# Patient Record
Sex: Female | Born: 1955 | Race: White | Hispanic: No | Marital: Married | Smoking: Never smoker
Health system: Southern US, Community
[De-identification: ages and names within clinical notes are randomized; demographics above are authoritative.]

## PROBLEM LIST (undated history)

## (undated) DIAGNOSIS — I10 Essential (primary) hypertension: Secondary | ICD-10-CM

## (undated) DIAGNOSIS — M1990 Unspecified osteoarthritis, unspecified site: Secondary | ICD-10-CM

## (undated) DIAGNOSIS — R112 Nausea with vomiting, unspecified: Secondary | ICD-10-CM

## (undated) DIAGNOSIS — K219 Gastro-esophageal reflux disease without esophagitis: Secondary | ICD-10-CM

## (undated) DIAGNOSIS — M810 Age-related osteoporosis without current pathological fracture: Secondary | ICD-10-CM

## (undated) DIAGNOSIS — Z9889 Other specified postprocedural states: Secondary | ICD-10-CM

## (undated) HISTORY — PX: WRIST SURGERY: SHX841

---

## 2005-07-24 ENCOUNTER — Ambulatory Visit (HOSPITAL_COMMUNITY): Admission: RE | Admit: 2005-07-24 | Discharge: 2005-07-24 | Payer: Self-pay | Admitting: Orthopedic Surgery

## 2005-10-01 ENCOUNTER — Ambulatory Visit (HOSPITAL_BASED_OUTPATIENT_CLINIC_OR_DEPARTMENT_OTHER): Admission: RE | Admit: 2005-10-01 | Discharge: 2005-10-02 | Payer: Self-pay | Admitting: *Deleted

## 2009-01-18 ENCOUNTER — Other Ambulatory Visit: Admission: RE | Admit: 2009-01-18 | Discharge: 2009-01-18 | Payer: Self-pay | Admitting: Family Medicine

## 2010-12-13 NOTE — Op Note (Signed)
NAMETELISSA, SERFASS                 ACCOUNT NO.:  [id number]   MEDICAL RECORD NO.:  [id number]          PATIENT TYPE:  AMB   LOCATION:  DSC                          FACILITY:  [facility]   PHYSICIAN:  Tennis Must Meyerdierks, M.D.DATE OF BIRTH:  02/28/1956   DATE OF PROCEDURE:  10/01/2005  DATE OF DISCHARGE:                                 OPERATIVE REPORT   PREOP DIAGNOSIS:  Scapholunate advanced collapse, right wrist.   POSTOP DIAGNOSIS:  Scapholunate advanced collapse, right wrist.   PROCEDURE:  Proximal row carpectomy, right wrist.   SURGEON:  Dr. Metro Kung.   ANESTHESIA:  General.   OPERATIVE FINDINGS:  The patient had complete loss of articular cartilage on  the proximal scaphoid and the radial half of the distal radius. The lunate  facet and the proximal capitate had no signs of arthritic change.   PROCEDURE:  Under general anesthesia with a tourniquet on the right arm, the  right hand was prepped and draped in usual fashion and after exsanguinating  the limb, the tourniquet was inflated to 250 mmHg. A longitudinal incision  was made in the dorsum of the wrist and carried down to the third dorsal  compartment. The EPL tendon was released from its sheath. The tendons of the  fourth dorsal compartment were then elevated subperiosteally off of the  distal radius and retracted ulnarly. The capsule of the joint was opened  longitudinally and a Therapist, nutritional was inserted to examine the articular  surfaces. The radioscaphoid joint was completely denuded of cartilage. The  radiolunate joint had good cartilage on both sides. The capital lunate joint  also had good cartilage on both sides. It was determined at this point the  proximal row carpectomy would be appropriate. The lunate was removed in a  piecemeal fashion with a rongeur.  The volar portion was excised after  dividing the volar half of the lunate into two pieces with osteotome. Care  was taken to protect the ligaments  volarly. The triquetrum was then removed  after dividing it in two with a osteotome. The TFC remained intact, although  it did have a small hole and it. After completely excising the triquetrum,  the fusiform was seen and left untouched. The scaphoid was then excised  after dividing it in two with an osteotome. There was a strong radioscaphoid  capitate ligament that was protected. The distal 10% of the scaphoid was  left alone as it did not involve any impaction on the radius. It was mainly  the volar portion that was capsular that was left in place. After excising  the scaphoid, the capitate shifted proximally into the facet on the distal  radius of the lunate. X-rays showed good position of the capitate and also  good excision of the proximal row. The wound was then irrigated copiously  with saline. The capsular tissue was repaired with a 4-0 Vicryl suture. The  extensor sheath of the third dorsal compartment was likewise repaired with a  4-0 Vicryl to the fourth dorsal compartment sheath. Vessel loop drain was  left in for drainage.  Subcutaneous tissue  was closed with  4-0 Vicryl. The skin was closed with a 3-0 subcuticular Prolene. Steri-  Strips were applied. Half percent Marcaine was inserted in the wound edges  for pain control. The patient had the tourniquet released with good  circulation of the hand. She was placed in a volar wrist splint. She went to  the recovery room awake in stable condition.      Lowell Bouton, M.D.  Electronically Signed     EMM/MEDQ  D:  10/01/2005  T:  10/02/2005  Job:  [id number]   cc:   Areatha Keas, M.D.  Fax: [phone]

## 2011-01-16 ENCOUNTER — Other Ambulatory Visit (HOSPITAL_COMMUNITY)
Admission: RE | Admit: 2011-01-16 | Discharge: 2011-01-16 | Disposition: A | Discharge status: Home / Self Care | Payer: BC Managed Care – PPO | Source: Ambulatory Visit | Attending: Family Medicine | Admitting: Family Medicine

## 2011-01-16 ENCOUNTER — Other Ambulatory Visit: Payer: Self-pay | Admitting: Family Medicine

## 2011-01-16 DIAGNOSIS — Z1159 Encounter for screening for other viral diseases: Secondary | ICD-10-CM | POA: Insufficient documentation

## 2011-01-16 DIAGNOSIS — Z124 Encounter for screening for malignant neoplasm of cervix: Secondary | ICD-10-CM | POA: Insufficient documentation

## 2012-12-24 ENCOUNTER — Other Ambulatory Visit (HOSPITAL_COMMUNITY)
Admission: RE | Admit: 2012-12-24 | Discharge: 2012-12-24 | Disposition: A | Discharge status: Home / Self Care | Payer: BC Managed Care – PPO | Source: Ambulatory Visit | Attending: Family Medicine | Admitting: Family Medicine

## 2012-12-24 ENCOUNTER — Other Ambulatory Visit: Payer: Self-pay | Admitting: Family Medicine

## 2012-12-24 DIAGNOSIS — Z1151 Encounter for screening for human papillomavirus (HPV): Secondary | ICD-10-CM | POA: Insufficient documentation

## 2012-12-24 DIAGNOSIS — Z01419 Encounter for gynecological examination (general) (routine) without abnormal findings: Secondary | ICD-10-CM | POA: Insufficient documentation

## 2015-02-12 ENCOUNTER — Other Ambulatory Visit (HOSPITAL_COMMUNITY)
Admission: RE | Admit: 2015-02-12 | Discharge: 2015-02-12 | Disposition: A | Discharge status: Home / Self Care | Payer: BLUE CROSS/BLUE SHIELD | Source: Ambulatory Visit | Attending: Physician Assistant | Admitting: Physician Assistant

## 2015-02-12 ENCOUNTER — Other Ambulatory Visit: Payer: Self-pay | Admitting: Physician Assistant

## 2015-02-12 DIAGNOSIS — Z124 Encounter for screening for malignant neoplasm of cervix: Secondary | ICD-10-CM | POA: Insufficient documentation

## 2015-02-12 DIAGNOSIS — Z1151 Encounter for screening for human papillomavirus (HPV): Secondary | ICD-10-CM | POA: Diagnosis present

## 2015-02-13 LAB — CYTOLOGY - PAP

## 2015-12-21 DIAGNOSIS — K2970 Gastritis, unspecified, without bleeding: Secondary | ICD-10-CM | POA: Diagnosis not present

## 2015-12-21 DIAGNOSIS — M25559 Pain in unspecified hip: Secondary | ICD-10-CM | POA: Diagnosis not present

## 2015-12-21 DIAGNOSIS — M459 Ankylosing spondylitis of unspecified sites in spine: Secondary | ICD-10-CM | POA: Diagnosis not present

## 2015-12-21 DIAGNOSIS — M7060 Trochanteric bursitis, unspecified hip: Secondary | ICD-10-CM | POA: Diagnosis not present

## 2016-01-09 DIAGNOSIS — J020 Streptococcal pharyngitis: Secondary | ICD-10-CM | POA: Diagnosis not present

## 2016-02-04 DIAGNOSIS — M419 Scoliosis, unspecified: Secondary | ICD-10-CM | POA: Diagnosis not present

## 2016-02-04 DIAGNOSIS — M7061 Trochanteric bursitis, right hip: Secondary | ICD-10-CM | POA: Diagnosis not present

## 2016-03-04 DIAGNOSIS — M542 Cervicalgia: Secondary | ICD-10-CM | POA: Diagnosis not present

## 2016-03-04 DIAGNOSIS — M419 Scoliosis, unspecified: Secondary | ICD-10-CM | POA: Diagnosis not present

## 2016-03-04 DIAGNOSIS — M545 Low back pain: Secondary | ICD-10-CM | POA: Diagnosis not present

## 2016-03-11 ENCOUNTER — Ambulatory Visit: Payer: BLUE CROSS/BLUE SHIELD | Admitting: Physical Therapy

## 2016-04-29 ENCOUNTER — Ambulatory Visit (INDEPENDENT_AMBULATORY_CARE_PROVIDER_SITE_OTHER): Payer: BLUE CROSS/BLUE SHIELD | Admitting: Orthopaedic Surgery

## 2016-04-29 DIAGNOSIS — M542 Cervicalgia: Secondary | ICD-10-CM

## 2016-04-29 DIAGNOSIS — M545 Low back pain: Secondary | ICD-10-CM

## 2016-04-29 DIAGNOSIS — M419 Scoliosis, unspecified: Secondary | ICD-10-CM

## 2016-05-21 DIAGNOSIS — M5136 Other intervertebral disc degeneration, lumbar region: Secondary | ICD-10-CM | POA: Diagnosis not present

## 2016-10-01 ENCOUNTER — Encounter (INDEPENDENT_AMBULATORY_CARE_PROVIDER_SITE_OTHER): Payer: Self-pay | Admitting: Orthopedic Surgery

## 2016-10-01 ENCOUNTER — Ambulatory Visit (INDEPENDENT_AMBULATORY_CARE_PROVIDER_SITE_OTHER): Payer: BLUE CROSS/BLUE SHIELD | Admitting: Orthopedic Surgery

## 2016-10-01 DIAGNOSIS — M7061 Trochanteric bursitis, right hip: Secondary | ICD-10-CM | POA: Diagnosis not present

## 2016-10-01 MED ORDER — BUPIVACAINE HCL 0.25 % IJ SOLN
4.0000 mL | INTRAMUSCULAR | Status: AC | PRN
  Administered 2016-10-01: 4 mL via INTRA_ARTICULAR

## 2016-10-01 MED ORDER — TRIAMCINOLONE ACETONIDE 40 MG/ML IJ SUSP
40.0000 mg | INTRAMUSCULAR | Status: AC | PRN
  Administered 2016-10-01: 40 mg via INTRA_ARTICULAR

## 2016-10-01 MED ORDER — LIDOCAINE HCL 1 % IJ SOLN
5.0000 mL | INTRAMUSCULAR | Status: AC | PRN
  Administered 2016-10-01: 5 mL

## 2016-10-01 NOTE — Progress Notes (Signed)
Office Visit Note   Patient: Susan Woodward           Date of Birth: 02-18-1956           MRN: [id number] Visit Date: 10/01/2016 Requested by: Marcelo Baldy, PA-C [address] [place], [place] [address] PCP: Marcelo Baldy, PA-C  Subjective: Chief Complaint  Patient presents with  . Right Hip - Pain    HPI: Susan Woodward is a 61 year old female with right hip pain.  She states that she has trochanteric pain.  Onset Friday, 09/26/2016.  Yesterday and today it hurts so badly she couldn't really been over. She actually has a positional component of the pain which is related to increasing and decreasing lordosis of her spine.  She took some outdated hydrocodone.  She is wondering about an injection today.  Previous trochanteric injection helped.  She took prednisone 3 months ago.  She has no history of back surgery.  She states she has been limping.  She cannot walk a mile daily she used to.  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  Denies any numbness and tingling radiating down the leg.  Physical therapy costs her out of pocket for her and therefore cost prohibitive.  She takes Lopid Naprosyn and Tylenol for her symptoms              ROS: All systems reviewed are negative as they relate to the chief complaint within the history of present illness.  Patient denies  fevers or chills.   Assessment & Plan: Visit Diagnoses:  1. Trochanteric bursitis, right hip     Plan: impression is rig.  All radiographs reviewed show what could be early hip arthritis as well.  She is having some groin pain which radiates to the back.  I think a trochanteric injection would be diagnostic and therapeutic.  If not helpful than I would favor phone call with scheduling with Dr. Alvester Morin for hip joint injection.  I'll see her back as needed ultrasound guided trochanteric bursa per injection performed today  Follow-Up Instructions: Return if symptoms worsen or fail to improve.   Orders:  No orders of  the defined types were placed in this encounter.  No orders of the defined types were placed in this encounter.     Procedures: Large Joint Inj Date/Time: 10/01/2016 8:23 PM Performed by: Cammy Copa Authorized by: Cammy Copa   Consent Given by:  Patient Site marked: the procedure site was marked   Timeout: prior to procedure the correct patient, procedure, and site was verified   Indications:  Pain and diagnostic evaluation Location:  Hip Site:  R greater trochanter Prep: patient was prepped and draped in usual sterile fashion   Needle Size:  18 G Needle Length:  3.5 inches Approach:  Lateral Ultrasound Guidance: Yes   Fluoroscopic Guidance: No   Arthrogram: No   Medications:  5 mL lidocaine 1 %; 40 mg triamcinolone acetonide 40 MG/ML; 4 mL bupivacaine 0.25 % Aspiration Attempted: No   Patient tolerance:  Patient tolerated the procedure well with no immediate complications      Clinical Data: No additional findings.  Objective: Vital Signs: There were no vitals taken for this visit.  Physical Exam:   Constitutional: Patient appears well-developed HEENT:  Head: Normocephalic Eyes:EOM are normal Neck: Normal range of motion Cardiovascular: Normal rate Pulmonary/chest: Effort normal Neurologic: Patient is alert Skin: Skin is warm Psychiatric: Patient has normal mood and affect  Ortho Exam: orthopedic exam demonstrates non-tension  STABLE COLLATERAL CRUCIAL LIGAMENT IN THE RIGHT KNEE TROCHANTERIC TENDERNESS IS PRESENT ON THE RIGHT NOT ON THE LEFT>  No real definitive groin pain with internal/external rotation of the leg.  No other masses lymph adenopathy or skin changes noted in the hip region.  No nerve root tension signs today.  No muscle atrophy in the leg.  Specialty Comments:  No specialty comments available.  Imaging: No results found.   PMFS History: Patient Active Problem List   Diagnosis Date Noted  . Trochanteric bursitis,  right hip 10/01/2016   No past medical history on file.  No family history on file.  No past surgical history on file. Social History   Occupational History  . Not on file.   Social History Main Topics  . Smoking status: Never Smoker  . Smokeless tobacco: Never Used  . Alcohol use Not on file  . Drug use: Unknown  . Sexual activity: Not on file

## 2016-10-08 ENCOUNTER — Other Ambulatory Visit (INDEPENDENT_AMBULATORY_CARE_PROVIDER_SITE_OTHER): Payer: Self-pay | Admitting: Orthopaedic Surgery

## 2016-10-08 NOTE — Telephone Encounter (Signed)
Ok for refill? 

## 2016-10-10 DIAGNOSIS — Z0000 Encounter for general adult medical examination without abnormal findings: Secondary | ICD-10-CM | POA: Diagnosis not present

## 2016-10-10 DIAGNOSIS — M8588 Other specified disorders of bone density and structure, other site: Secondary | ICD-10-CM | POA: Diagnosis not present

## 2016-10-20 DIAGNOSIS — Z803 Family history of malignant neoplasm of breast: Secondary | ICD-10-CM | POA: Diagnosis not present

## 2016-10-20 DIAGNOSIS — M8589 Other specified disorders of bone density and structure, multiple sites: Secondary | ICD-10-CM | POA: Diagnosis not present

## 2016-10-20 DIAGNOSIS — M810 Age-related osteoporosis without current pathological fracture: Secondary | ICD-10-CM | POA: Diagnosis not present

## 2016-10-20 DIAGNOSIS — Z1231 Encounter for screening mammogram for malignant neoplasm of breast: Secondary | ICD-10-CM | POA: Diagnosis not present

## 2017-01-12 ENCOUNTER — Other Ambulatory Visit (INDEPENDENT_AMBULATORY_CARE_PROVIDER_SITE_OTHER): Payer: Self-pay | Admitting: Orthopaedic Surgery

## 2017-01-12 NOTE — Telephone Encounter (Signed)
Ok for refill? 

## 2017-05-13 ENCOUNTER — Encounter (INDEPENDENT_AMBULATORY_CARE_PROVIDER_SITE_OTHER): Payer: Self-pay | Admitting: Orthopaedic Surgery

## 2017-05-13 ENCOUNTER — Ambulatory Visit (INDEPENDENT_AMBULATORY_CARE_PROVIDER_SITE_OTHER): Payer: BLUE CROSS/BLUE SHIELD | Admitting: Orthopaedic Surgery

## 2017-05-13 ENCOUNTER — Ambulatory Visit (INDEPENDENT_AMBULATORY_CARE_PROVIDER_SITE_OTHER): Payer: BLUE CROSS/BLUE SHIELD

## 2017-05-13 VITALS — BP 146/89 | HR 72 | Ht 59.0 in | Wt 120.0 lb

## 2017-05-13 DIAGNOSIS — M545 Low back pain: Secondary | ICD-10-CM | POA: Diagnosis not present

## 2017-05-13 NOTE — Progress Notes (Signed)
Office Visit Note   Patient: Susan Woodward           Date of Birth: 1956/06/14           MRN: [id number] Visit Date: 05/13/2017              Requested by: Marcelo Baldy, PA-C [address] [place], [place] [address] PCP: Marcelo Baldy, PA-C   Assessment & Plan: Visit Diagnoses:  1. Acute bilateral low back pain, with sciatica presence unspecified     Plan: Patient has significant the scoliosis is likely having some early claudication symptoms with lateral recess stenosis and nerve root irritation chronically on the right now more recently starting on the left. We discussed signs or symptoms that would suggest further workup is needed. She understands some point she will require decompression and fusion procedure if her symptoms progress. Currently activity levels been good she'll continue with meloxicam and Tylenol. Follow-up if symptoms increase.  Follow-Up Instructions: No Follow-up on file.   Orders:  Orders Placed This Encounter  Procedures  . XR Lumbar Spine 2-3 Views   No orders of the defined types were placed in this encounter.     Procedures: No procedures performed   Clinical Data: No additional findings.   Subjective: Chief Complaint  Patient presents with  . Lower Back - Pain    HPI 61 year old female returns with ongoing chronic low back pain significant lumbar curvature with a right lumbar curve apex at L3 and spondylolisthesis grade 2 at L4-5. She's had some pain with prolonged standing. Just alter sitting positions on the front part of the chair sitting more upright makes her feel better. She's been active and just moved a smaller house, remodeling and moving with a lot of lifting bending etc. She usually has symptoms on the right side but recently started have some symptoms in the left hip in the left eye. She had previous right trochanteric bursa injection which gave her some relief. She takes Tylenol and meloxicam which helps. She is able to  walk and shop recently went [place] and states she did well. No associated bowel or bladder symptoms. 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  Review of Systems phosphor scoliosis L4-5 spondylolisthesis, mild bilateral hip osteoarthritis, history of right trochanteric bursitis. No change in surgical history or hospitalizations since last office visit 04/29/2016.   Objective: Vital Signs: BP (!) 146/89   Pulse 72   Ht 4\' 11"  (1.499 m)   Wt 120 lb (54.4 kg)   BMI 24.24 kg/m   Physical Exam  Constitutional: She is oriented to person, place, and time. She appears well-developed.  HENT:  Head: Normocephalic.  Right Ear: External ear normal.  Left Ear: External ear normal.  Eyes: Pupils are equal, round, and reactive to light.  Neck: No tracheal deviation present. No thyromegaly present.  Cardiovascular: Normal rate.   Pulmonary/Chest: Effort normal.  Abdominal: Soft.  Neurological: She is alert and oriented to person, place, and time.  Skin: Skin is warm and dry.  Psychiatric: She has a normal mood and affect. Her behavior is normal.    Ortho Exam patient some sciatic notch tenderness Straight leg raising 90 internal rotation both hips 30 with minimal discomfort. No hip flexion contracture. Her tib EHL is strong. Normal distal pulses. No rash or expose skin no pitting edema.  Specialty Comments:  No specialty comments available.  Imaging: No results found.   PMFS History: Patient  Active Problem List   Diagnosis Date Noted  . Trochanteric bursitis, right hip 10/01/2016   No past medical history on file.  No family history on file.  No past surgical history on file. Social History   Occupational History  . Not on file.   Social History Main Topics  . Smoking status: Never Smoker  . Smokeless tobacco: Never Used  . Alcohol use Not on file  . Drug use: Unknown  . Sexual activity: Not on file

## 2017-05-14 ENCOUNTER — Other Ambulatory Visit (INDEPENDENT_AMBULATORY_CARE_PROVIDER_SITE_OTHER): Payer: Self-pay | Admitting: Orthopaedic Surgery

## 2017-05-14 NOTE — Telephone Encounter (Signed)
Ok for refill? 

## 2017-06-15 ENCOUNTER — Telehealth (INDEPENDENT_AMBULATORY_CARE_PROVIDER_SITE_OTHER): Payer: Self-pay | Admitting: Orthopaedic Surgery

## 2017-06-15 DIAGNOSIS — M4807 Spinal stenosis, lumbosacral region: Secondary | ICD-10-CM

## 2017-06-15 NOTE — Telephone Encounter (Signed)
Please advise 

## 2017-06-15 NOTE — Telephone Encounter (Signed)
I called. Set her for MRI lumbar, L4-5 anterolithesis with left lateral recess/foraminal stenosis. ROV after scan and we can consider ESI thanks

## 2017-06-16 NOTE — Addendum Note (Signed)
Addended by: Rogers SeedsYEATTS, Shunda Rabadi M on: 06/16/2017 11:10 AM   Modules accepted: Orders

## 2017-06-16 NOTE — Telephone Encounter (Signed)
Order entered

## 2017-07-01 ENCOUNTER — Ambulatory Visit
Admission: RE | Admit: 2017-07-01 | Discharge: 2017-07-01 | Disposition: A | Discharge status: Home / Self Care | Payer: BLUE CROSS/BLUE SHIELD | Source: Ambulatory Visit | Attending: Orthopaedic Surgery | Admitting: Orthopaedic Surgery

## 2017-07-01 DIAGNOSIS — M5126 Other intervertebral disc displacement, lumbar region: Secondary | ICD-10-CM | POA: Diagnosis not present

## 2017-07-01 DIAGNOSIS — M4807 Spinal stenosis, lumbosacral region: Secondary | ICD-10-CM

## 2017-07-08 ENCOUNTER — Ambulatory Visit (INDEPENDENT_AMBULATORY_CARE_PROVIDER_SITE_OTHER): Payer: BLUE CROSS/BLUE SHIELD | Admitting: Orthopaedic Surgery

## 2017-07-08 ENCOUNTER — Encounter (INDEPENDENT_AMBULATORY_CARE_PROVIDER_SITE_OTHER): Payer: Self-pay | Admitting: Orthopaedic Surgery

## 2017-07-08 VITALS — BP 143/84 | HR 77

## 2017-07-08 DIAGNOSIS — M4156 Other secondary scoliosis, lumbar region: Secondary | ICD-10-CM

## 2017-07-08 DIAGNOSIS — M48061 Spinal stenosis, lumbar region without neurogenic claudication: Secondary | ICD-10-CM | POA: Diagnosis not present

## 2017-07-08 NOTE — Progress Notes (Signed)
Office Visit Note   Patient: Susan Woodward           Date of Birth: [birth year]           MRN: [id number] Visit Date: 07/08/2017              Requested by: Marcelo Baldy, PA-C [address] [place], [place] [address] PCP: Marcelo Baldy, PA-C   Assessment & Plan: Visit Diagnoses:  1. Spinal stenosis of lumbar region, unspecified whether neurogenic claudication present   2. Scoliosis of lumbar region due to degenerative disease of spine in adult     Plan: Patient will continue walking program continue meloxicam.  She has severe stenosis at L4-5 with instability and listhesis.  Plan to recheck her in a month we discussed options including single level fusion versus multilevel fusion with instrumentation.  Questions were elicited and answered and she can return in 1 month to discuss this further.  Follow-Up Instructions: Return in about 1 month (around 08/08/2017).   Orders:  No orders of the defined types were placed in this encounter.  No orders of the defined types were placed in this encounter.     Procedures: No procedures performed   Clinical Data: No additional findings.   Subjective: Chief Complaint  Patient presents with  . Lower Back - Pain, Follow-up    MRI review    HPI 61 year old female returns for follow-up for MRI scan review with symptoms of lumbar spinal stenosis and scoliosis.  She primarily has back pain with sitting pain that radiates into her right buttocks into her right thigh stops at the knee rarely goes to the foot.  She states she is able to stand and is able to walk and sometimes can stand for several hours.  Previous x-rays showed L4-5 grade 1.5 spondylolisthesis with several millimeters of lateral listhesis.  She had previous trochanteric injection done back exercises and is currently taking meloxicam.  Review of Systems positive for bilateral hip osteoarthritis.  History of right trochanteric bursitis.  Scoliosis, spinal stenosis.  14  point systems updated unchanged from 04/29/2016.   Objective: Vital Signs: BP (!) 143/84   Pulse 77   Physical Exam  Constitutional: She is oriented to person, place, and time. She appears well-developed.  HENT:  Head: Normocephalic.  Right Ear: External ear normal.  Left Ear: External ear normal.  Eyes: Pupils are equal, round, and reactive to light.  Neck: No tracheal deviation present. No thyromegaly present.  Cardiovascular: Normal rate.  Pulmonary/Chest: Effort normal.  Abdominal: Soft.  Neurological: She is alert and oriented to person, place, and time.  Skin: Skin is warm and dry.  Psychiatric: She has a normal mood and affect. Her behavior is normal.    Ortho Exam reflexes are intact anterior tib gastrocsoleus is intact she has scoliosis with some sciatic notch tenderness on the right negative straight leg raising 90 degrees.  Distal pulses are intact.  Specialty Comments:  No specialty comments available.  Imaging: CLINICAL DATA:  61 y/o F; numbness and left leg that radiates into the left foot for 1 month. Several years of back pain.  EXAM: MRI LUMBAR SPINE WITHOUT CONTRAST  TECHNIQUE: Multiplanar, multisequence MR imaging of the lumbar spine was performed. No intravenous contrast was administered.  COMPARISON:  03/04/2016 lumbar spine radiographs  FINDINGS: Segmentation:  Standard.  Alignment: Grade 1 L4-5 anterolisthesis. Moderate dextrocurvature with apex at L4.  Vertebrae: L1-2 mild anterior degenerative endplate edema. No evidence for discitis, fracture,  or suspicious bone lesion.  Conus medullaris and cauda equina: Conus extends to the L2 level. Conus and cauda equina appear normal.  Paraspinal and other soft tissues: Negative.  Disc levels:  T12-L1: Small disc bulge. No significant foraminal or canal stenosis.  L1-2: Small disc bulge with eccentric right foraminal and extraforaminal marginal osteophytes. Mild right foraminal  stenosis. No significant canal stenosis.  L2-3: Small disc bulge, endplate marginal osteophytes, and mild facet hypertrophy. Mild bilateral foraminal and left lateral recess stenosis. No significant canal stenosis.  L3-4: Small disc bulge eccentric to the left with left foraminal and extraforaminal marginal osteophytes as well as left-greater-than-right facet and ligamentum flavum hypertrophy. Mild right and severe left foraminal stenosis. Mild left lateral recess stenosis. No significant canal stenosis.  L4-5: Anterolisthesis with small uncovered disc bulge and severe bilateral facet hypertrophy. Moderate right and severe left foraminal stenosis. Severe canal stenosis.  L5-S1: Small disc bulge eccentric to the right with mild bilateral facet hypertrophy. Moderate right and mild left foraminal stenosis. No significant canal stenosis.  IMPRESSION: 1. Grade 1 L4-5 anterolisthesis. Moderate dextrocurvature with apex at L4. 2. No acute osseous abnormality. 3. Multifactorial severe L4-5 canal stenosis. Otherwise no significant canal stenosis. 4. Multilevel mild and moderate foraminal stenosis. Severe left L3-4 and L4-5 foraminal stenosis.   Electronically Signed   By: Mitzi Hansen M.D.   On: 07/01/2017 22:47    PMFS History: Patient Active Problem List   Diagnosis Date Noted  . Spinal stenosis of lumbar region 07/13/2017  . Trochanteric bursitis, right hip 10/01/2016   History reviewed. No pertinent past medical history.  History reviewed. No pertinent family history.  History reviewed. No pertinent surgical history. Social History   Occupational History  . Not on file  Tobacco Use  . Smoking status: Never Smoker  . Smokeless tobacco: Never Used  Substance and Sexual Activity  . Alcohol use: Not on file  . Drug use: Not on file  . Sexual activity: Not on file

## 2017-07-13 ENCOUNTER — Encounter (INDEPENDENT_AMBULATORY_CARE_PROVIDER_SITE_OTHER): Payer: Self-pay | Admitting: Orthopaedic Surgery

## 2017-07-13 DIAGNOSIS — M48061 Spinal stenosis, lumbar region without neurogenic claudication: Secondary | ICD-10-CM | POA: Insufficient documentation

## 2017-08-05 ENCOUNTER — Encounter (INDEPENDENT_AMBULATORY_CARE_PROVIDER_SITE_OTHER): Payer: Self-pay | Admitting: Orthopaedic Surgery

## 2017-08-05 ENCOUNTER — Ambulatory Visit (INDEPENDENT_AMBULATORY_CARE_PROVIDER_SITE_OTHER): Payer: BLUE CROSS/BLUE SHIELD | Admitting: Orthopaedic Surgery

## 2017-08-05 VITALS — Ht 59.0 in | Wt 120.0 lb

## 2017-08-05 DIAGNOSIS — M48062 Spinal stenosis, lumbar region with neurogenic claudication: Secondary | ICD-10-CM | POA: Diagnosis not present

## 2017-08-11 ENCOUNTER — Encounter (INDEPENDENT_AMBULATORY_CARE_PROVIDER_SITE_OTHER): Payer: Self-pay | Admitting: Orthopaedic Surgery

## 2017-08-11 NOTE — Progress Notes (Signed)
Office Visit Note   Patient: Susan Woodward           Date of Birth: 1955-09-17           MRN: [id number] Visit Date: 08/05/2017              Requested by: Marcelo Baldy, PA-C [address] [place], [place] [address] PCP: Juluis Rainier, MD   Assessment & Plan: Visit Diagnoses:  1. Spinal stenosis of lumbar region with neurogenic claudication     Plan: Patient's back pain and claudication symptoms have progressed over several years.  Grade 1 anterolisthesis with disc bulge and severe bilateral facet hypertrophy of moderate right and severe left foraminal stenosis and severe central canal stenosis multifactorial.  Mild narrowing 5 1 and some narrowing L3-4 with mild foraminal narrowing on the right and severe on the left.  On plain radiographs L4-5 level perpendicular to the floor.  Discussed options of long instrumented fusion versus single level decompression and instrumented fusion.  She understands that she may have progression at other levels and may require further surgery in the future.  Discussed her case with my partner Dr. Otelia Sergeant as well reviewed images and x-rays.  Operative plan would be single level instrumented decompression and fusion at L4-5 with left interbody fusion, pedicle instrumentation and interbody cage.  Risk of surgery discussed including pseudoarthrosis, reoperation, dural tear progression levels.  Potential for need for longer fusion extending up to the lower thoracic region in the future as a possibility.  Questions were elicited and answered she understands and requests we proceed.  Studies plain radiographs were reviewed outlined treatment plan was reviewed in detail.  Follow-Up Instructions: Preop for Gill procedure,  L4 5 TLIF , pedicle instrumentation decompression, cage stabilization for her severe stenosis and instability with anterolisthesis.  Orders:  No orders of the defined types were placed in this encounter.  No orders of the defined types  were placed in this encounter.     Procedures: No procedures performed   Clinical Data: No additional findings.   Subjective: Chief Complaint  Patient presents with  . Lower Back - Pain, Follow-up    HPI-year-old female returns with severe spinal stenosis at L4-5.  She has scoliosis and some mild narrowing at other levels but severe stenosis with anterolisthesis and severe by foraminal stenosis at L4-5.  She has low back pain does better if she is moving.  With standing sitting.  At times she can stand for extended period of time other times she can only stand for short periods of time and rest of either lay down or sit in a particular position to get some relief.  She has been on meloxicam and has been trying to be active.  At times her pain prevents her from participating in family activities.  Patient does have some bilateral hip osteoarthritis.  Groin pain rotation does not reproduce her symptoms.  Time she rates her pain is moderate to severe.  Has been through an exercise program tries to continue to walk daily she is had Naprosyn, ibuprofen and also Mobic in the past and uses Tylenol.  Taking any narcotic medication.  Review of Systems positive for severe L4-5 spinal stenosis with spondylolisthesis by foraminal stenosis.  Mild hip osteoarthritis.  History of right trochanteric bursitis.  Patient is a non-smoker.  Negative surgical history.   Objective: Vital Signs: Ht 4\' 11"  (1.499 m)   Wt 120 lb (54.4 kg)   BMI 24.24 kg/m  Physical Exam  Constitutional: She is oriented to person, place, and time. She appears well-developed.  HENT:  Head: Normocephalic.  Right Ear: External ear normal.  Left Ear: External ear normal.  Eyes: Pupils are equal, round, and reactive to light.  Neck: No tracheal deviation present. No thyromegaly present.  Cardiovascular: Normal rate.  Pulmonary/Chest: Effort normal.  Abdominal: Soft.  Neurological: She is alert and oriented to person, place,  and time.  Skin: Skin is warm and dry.  Psychiatric: She has a normal mood and affect. Her behavior is normal.    Ortho Exam patient had negative logroll to the hips.  Anterior tib EHL gastrocsoleus is strong.  She has some trochanteric tenderness worse on the right than left.  Minimal discomfort with internal/external rotation of her hips.  No atrophy of the quads.  Anterior tib gastrocsoleus are strong.  She can heel and toe walk.  Distal pulses are 2+ and symmetrical.  She has bilateral sciatic notch tenderness.  Popliteal compression test.  Negative Faber test right and left.  No inguinal lymphadenopathy. Specialty Comments:  No specialty comments available.  Imaging: CLINICAL DATA:  62 y/o F; numbness and left leg that radiates into the left foot for 1 month. Several years of back pain.  EXAM: MRI LUMBAR SPINE WITHOUT CONTRAST  TECHNIQUE: Multiplanar, multisequence MR imaging of the lumbar spine was performed. No intravenous contrast was administered.  COMPARISON:  03/04/2016 lumbar spine radiographs  FINDINGS: Segmentation:  Standard.  Alignment: Grade 1 L4-5 anterolisthesis. Moderate dextrocurvature with apex at L4.  Vertebrae: L1-2 mild anterior degenerative endplate edema. No evidence for discitis, fracture, or suspicious bone lesion.  Conus medullaris and cauda equina: Conus extends to the L2 level. Conus and cauda equina appear normal.  Paraspinal and other soft tissues: Negative.  Disc levels:  T12-L1: Small disc bulge. No significant foraminal or canal stenosis.  L1-2: Small disc bulge with eccentric right foraminal and extraforaminal marginal osteophytes. Mild right foraminal stenosis. No significant canal stenosis.  L2-3: Small disc bulge, endplate marginal osteophytes, and mild facet hypertrophy. Mild bilateral foraminal and left lateral recess stenosis. No significant canal stenosis.  L3-4: Small disc bulge eccentric to the left with left  foraminal and extraforaminal marginal osteophytes as well as left-greater-than-right facet and ligamentum flavum hypertrophy. Mild right and severe left foraminal stenosis. Mild left lateral recess stenosis. No significant canal stenosis.  L4-5: Anterolisthesis with small uncovered disc bulge and severe bilateral facet hypertrophy. Moderate right and severe left foraminal stenosis. Severe canal stenosis.  L5-S1: Small disc bulge eccentric to the right with mild bilateral facet hypertrophy. Moderate right and mild left foraminal stenosis. No significant canal stenosis.  IMPRESSION: 1. Grade 1 L4-5 anterolisthesis. Moderate dextrocurvature with apex at L4. 2. No acute osseous abnormality. 3. Multifactorial severe L4-5 canal stenosis. Otherwise no significant canal stenosis. 4. Multilevel mild and moderate foraminal stenosis. Severe left L3-4 and L4-5 foraminal stenosis.   Electronically Signed   By: Mitzi Hansen M.D.   On: 07/01/2017 22:47    PMFS History: Patient Active Problem List   Diagnosis Date Noted  . Spinal stenosis of lumbar region 07/13/2017  . Trochanteric bursitis, right hip 10/01/2016   No past medical history on file.  No family history on file.  No past surgical history on file. Social History   Occupational History  . Not on file  Tobacco Use  . Smoking status: Never Smoker  . Smokeless tobacco: Never Used  Substance and Sexual Activity  . Alcohol use: Not  on file  . Drug use: Not on file  . Sexual activity: Not on file

## 2017-08-12 ENCOUNTER — Other Ambulatory Visit (INDEPENDENT_AMBULATORY_CARE_PROVIDER_SITE_OTHER): Payer: Self-pay | Admitting: Orthopaedic Surgery

## 2017-08-12 NOTE — Telephone Encounter (Signed)
Ok for refill? 

## 2017-08-28 DIAGNOSIS — R21 Rash and other nonspecific skin eruption: Secondary | ICD-10-CM | POA: Diagnosis not present

## 2017-08-28 DIAGNOSIS — R05 Cough: Secondary | ICD-10-CM | POA: Diagnosis not present

## 2017-08-28 DIAGNOSIS — L509 Urticaria, unspecified: Secondary | ICD-10-CM | POA: Diagnosis not present

## 2017-11-05 ENCOUNTER — Other Ambulatory Visit (INDEPENDENT_AMBULATORY_CARE_PROVIDER_SITE_OTHER): Payer: Self-pay | Admitting: Orthopaedic Surgery

## 2017-11-05 NOTE — Telephone Encounter (Signed)
Ok for refill? 

## 2018-02-06 ENCOUNTER — Other Ambulatory Visit (INDEPENDENT_AMBULATORY_CARE_PROVIDER_SITE_OTHER): Payer: Self-pay | Admitting: Orthopaedic Surgery

## 2018-02-08 NOTE — Telephone Encounter (Signed)
Ok to rf? 

## 2018-03-19 DIAGNOSIS — Z23 Encounter for immunization: Secondary | ICD-10-CM | POA: Diagnosis not present

## 2018-05-12 ENCOUNTER — Other Ambulatory Visit (INDEPENDENT_AMBULATORY_CARE_PROVIDER_SITE_OTHER): Payer: Self-pay | Admitting: Orthopaedic Surgery

## 2018-05-12 NOTE — Telephone Encounter (Signed)
Ok for refill? 

## 2018-08-12 ENCOUNTER — Other Ambulatory Visit (INDEPENDENT_AMBULATORY_CARE_PROVIDER_SITE_OTHER): Payer: Self-pay | Admitting: Orthopaedic Surgery

## 2018-08-12 NOTE — Telephone Encounter (Signed)
Ok for refill? 

## 2018-08-26 DIAGNOSIS — Z803 Family history of malignant neoplasm of breast: Secondary | ICD-10-CM | POA: Diagnosis not present

## 2018-08-26 DIAGNOSIS — Z1231 Encounter for screening mammogram for malignant neoplasm of breast: Secondary | ICD-10-CM | POA: Diagnosis not present

## 2018-09-02 ENCOUNTER — Other Ambulatory Visit: Payer: Self-pay | Admitting: Family Medicine

## 2018-09-02 ENCOUNTER — Other Ambulatory Visit (HOSPITAL_COMMUNITY)
Admission: RE | Admit: 2018-09-02 | Discharge: 2018-09-02 | Disposition: A | Discharge status: Home / Self Care | Payer: BLUE CROSS/BLUE SHIELD | Source: Ambulatory Visit | Attending: Family Medicine | Admitting: Family Medicine

## 2018-09-02 DIAGNOSIS — Z23 Encounter for immunization: Secondary | ICD-10-CM | POA: Diagnosis not present

## 2018-09-02 DIAGNOSIS — Z0000 Encounter for general adult medical examination without abnormal findings: Secondary | ICD-10-CM | POA: Diagnosis not present

## 2018-09-02 DIAGNOSIS — M810 Age-related osteoporosis without current pathological fracture: Secondary | ICD-10-CM | POA: Diagnosis not present

## 2018-09-02 DIAGNOSIS — Z136 Encounter for screening for cardiovascular disorders: Secondary | ICD-10-CM | POA: Diagnosis not present

## 2018-09-02 DIAGNOSIS — Z124 Encounter for screening for malignant neoplasm of cervix: Secondary | ICD-10-CM | POA: Insufficient documentation

## 2018-09-02 DIAGNOSIS — Z1159 Encounter for screening for other viral diseases: Secondary | ICD-10-CM | POA: Diagnosis not present

## 2018-09-03 LAB — CYTOLOGY - PAP: Diagnosis: NEGATIVE

## 2018-09-10 DIAGNOSIS — M810 Age-related osteoporosis without current pathological fracture: Secondary | ICD-10-CM | POA: Diagnosis not present

## 2018-11-29 ENCOUNTER — Other Ambulatory Visit (INDEPENDENT_AMBULATORY_CARE_PROVIDER_SITE_OTHER): Payer: Self-pay | Admitting: Orthopaedic Surgery

## 2018-11-29 NOTE — Telephone Encounter (Signed)
Please advise 

## 2019-01-25 ENCOUNTER — Encounter: Payer: Self-pay | Admitting: Orthopaedic Surgery

## 2019-01-25 ENCOUNTER — Other Ambulatory Visit: Payer: Self-pay

## 2019-01-25 ENCOUNTER — Ambulatory Visit: Payer: 59

## 2019-01-25 ENCOUNTER — Ambulatory Visit (INDEPENDENT_AMBULATORY_CARE_PROVIDER_SITE_OTHER): Payer: 59 | Admitting: Orthopaedic Surgery

## 2019-01-25 VITALS — Ht 59.0 in | Wt 116.0 lb

## 2019-01-25 DIAGNOSIS — M48062 Spinal stenosis, lumbar region with neurogenic claudication: Secondary | ICD-10-CM

## 2019-01-25 DIAGNOSIS — G8929 Other chronic pain: Secondary | ICD-10-CM | POA: Diagnosis not present

## 2019-01-25 DIAGNOSIS — M25552 Pain in left hip: Secondary | ICD-10-CM

## 2019-01-25 DIAGNOSIS — M545 Low back pain, unspecified: Secondary | ICD-10-CM

## 2019-01-25 DIAGNOSIS — M25551 Pain in right hip: Secondary | ICD-10-CM

## 2019-01-25 NOTE — Progress Notes (Signed)
Office Visit Note   Patient: Susan Woodward           Date of Birth: Sep 16, 1955           MRN: [id number] Visit Date: 01/25/2019              Requested by: Juluis Rainier, MD [address] [place],  [place] [address] PCP: Juluis Rainier, MD   Assessment & Plan: Visit Diagnoses:  1. Chronic bilateral low back pain, unspecified whether sciatica present   2. Bilateral hip pain   3. Spinal stenosis of lumbar region with neurogenic claudication     Plan: Patient does have severe spinal stenosis at L4-5 but seems to be doing well with a walking program as long she walks 3 miles a day.  She does have some hip joint narrowing and left groin pain.  If this gets worse we could consider intra-articular left hip injection with or without fluoroscopy.  We will check her back again in 5 months.  She will use Tylenol and only occasionally use the meloxicam.  Follow-Up Instructions: Return in about 5 months (around 06/27/2019).   Orders:  Orders Placed This Encounter  Procedures  . XR Lumbar Spine 2-3 Views  . XR HIPS BILAT W OR W/O PELVIS 3-4 VIEWS   No orders of the defined types were placed in this encounter.     Procedures: No procedures performed   Clinical Data: No additional findings.   Subjective: Chief Complaint  Patient presents with  . Right Hip - Pain  . Left Hip - Pain  . Lower Back - Pain    HPI 63 year old female returns for follow-up of scoliosis with severe canal stenosis and anterolisthesis with degenerative facets at L4-5 which is grade 1.5 to grade 2.  She is gradually started a walking program and now is up to walking 3 miles a day and states as long she does that her back does much better.  She does not have to stop when she is walking.  She has had increased pain in the left anterior groin with occasional sharp back pain particularly when she bends such as pulling weeds.  Sometimes when she tries to get back upright she is not able.  He is taking  meloxicam which states her PCP would prefer her to take Tylenol.  She continues have some numbness and tingling in her legs but no weakness she can walk up stairs.  No associated bowel bladder symptoms.  Review of Systems patient states she is never had surgery.  Positive for scoliosis with severe stenosis L4-5.   Objective: Vital Signs: Ht 4\' 11"  (1.499 m)   Wt 116 lb (52.6 kg)   BMI 23.43 kg/m   Physical Exam Constitutional:      Appearance: She is well-developed.  HENT:     Head: Normocephalic.     Right Ear: External ear normal.     Left Ear: External ear normal.  Eyes:     Pupils: Pupils are equal, round, and reactive to light.  Neck:     Thyroid: No thyromegaly.     Trachea: No tracheal deviation.  Cardiovascular:     Rate and Rhythm: Normal rate.  Pulmonary:     Effort: Pulmonary effort is normal.  Abdominal:     Palpations: Abdomen is soft.  Skin:    General: Skin is warm and dry.  Neurological:     Mental Status: She is alert and oriented to person, place, and time.  Psychiatric:        Behavior: Behavior normal.     Ortho Exam patient has tenderness to palpation midportion left anterior groin.  No hip flexion weakness negative straight leg raising negative reverse straight leg raising.  Mild tenderness the greater trochanters mild sciatic notch tenderness.  Some limitation internal rotation left hip at 20 degrees but is not painful.  No hip flexion contracture.  Negative Faber test.  Distal pulses are 2+ no calf atrophy.  Normal heel toe gait.  Specialty Comments:  No specialty comments available.  Imaging: No results found.   PMFS History: Patient Active Problem List   Diagnosis Date Noted  . Spinal stenosis of lumbar region 07/13/2017  . Trochanteric bursitis, right hip 10/01/2016   No past medical history on file.  No family history on file.  No past surgical history on file. Social History   Occupational History  . Not on file  Tobacco Use  .  Smoking status: Never Smoker  . Smokeless tobacco: Never Used  Substance and Sexual Activity  . Alcohol use: Not on file  . Drug use: Not on file  . Sexual activity: Not on file

## 2019-03-02 ENCOUNTER — Other Ambulatory Visit (INDEPENDENT_AMBULATORY_CARE_PROVIDER_SITE_OTHER): Payer: Self-pay | Admitting: Orthopaedic Surgery

## 2019-03-02 NOTE — Telephone Encounter (Signed)
Please advise 

## 2019-06-28 ENCOUNTER — Ambulatory Visit: Payer: 59 | Admitting: Orthopaedic Surgery

## 2019-07-19 ENCOUNTER — Telehealth: Payer: Self-pay | Admitting: Radiology

## 2019-07-19 MED ORDER — MELOXICAM 15 MG PO TABS: ORAL_TABLET | ORAL | 2 refills | Status: DC

## 2019-07-19 NOTE — Telephone Encounter (Signed)
Ok thanks 

## 2019-07-19 NOTE — Telephone Encounter (Signed)
Sent to pharmacy 

## 2019-07-19 NOTE — Telephone Encounter (Signed)
Request for refill Meloxicam 15mg  tablet #30 with 2 refills received from pharmacy. OK to refill?

## 2019-07-19 NOTE — Addendum Note (Signed)
Addended by: Meyer Cory on: 07/19/2019 01:51 PM   Modules accepted: Orders

## 2019-11-09 ENCOUNTER — Other Ambulatory Visit: Payer: Self-pay | Admitting: Orthopaedic Surgery

## 2019-11-09 NOTE — Telephone Encounter (Signed)
Please advise 

## 2020-03-10 ENCOUNTER — Other Ambulatory Visit: Payer: Self-pay | Admitting: Orthopaedic Surgery

## 2020-03-12 NOTE — Telephone Encounter (Signed)
Please advise 

## 2020-08-04 ENCOUNTER — Other Ambulatory Visit: Payer: Self-pay | Admitting: Orthopaedic Surgery

## 2020-08-06 NOTE — Telephone Encounter (Signed)
Please advise 

## 2020-08-16 HISTORY — PX: BACK SURGERY: SHX140

## 2020-11-21 ENCOUNTER — Other Ambulatory Visit: Payer: Self-pay | Admitting: Orthopaedic Surgery

## 2021-04-05 ENCOUNTER — Other Ambulatory Visit: Payer: Self-pay | Admitting: Orthopaedic Surgery

## 2021-04-05 NOTE — Telephone Encounter (Signed)
Could you please advise since Dr. Ophelia Charter and Fayrene Fearing are out of the office?

## 2021-06-12 ENCOUNTER — Encounter (HOSPITAL_COMMUNITY): Payer: 59

## 2021-06-27 ENCOUNTER — Other Ambulatory Visit: Payer: Self-pay | Admitting: Family Medicine

## 2021-06-27 DIAGNOSIS — G4452 New daily persistent headache (NDPH): Secondary | ICD-10-CM

## 2021-07-01 ENCOUNTER — Encounter (HOSPITAL_BASED_OUTPATIENT_CLINIC_OR_DEPARTMENT_OTHER): Payer: Self-pay | Admitting: *Deleted

## 2021-07-01 ENCOUNTER — Other Ambulatory Visit: Payer: Self-pay

## 2021-07-01 ENCOUNTER — Emergency Department (HOSPITAL_BASED_OUTPATIENT_CLINIC_OR_DEPARTMENT_OTHER)
Admission: EM | Admit: 2021-07-01 | Discharge: 2021-07-02 | Disposition: A | Discharge status: Home / Self Care | Payer: BC Managed Care – PPO | Attending: Emergency Medicine | Admitting: Emergency Medicine

## 2021-07-01 ENCOUNTER — Emergency Department (HOSPITAL_BASED_OUTPATIENT_CLINIC_OR_DEPARTMENT_OTHER): Payer: BC Managed Care – PPO

## 2021-07-01 DIAGNOSIS — I10 Essential (primary) hypertension: Secondary | ICD-10-CM | POA: Diagnosis not present

## 2021-07-01 DIAGNOSIS — I959 Hypotension, unspecified: Secondary | ICD-10-CM | POA: Diagnosis not present

## 2021-07-01 DIAGNOSIS — G4452 New daily persistent headache (NDPH): Secondary | ICD-10-CM | POA: Diagnosis not present

## 2021-07-01 DIAGNOSIS — R42 Dizziness and giddiness: Secondary | ICD-10-CM | POA: Diagnosis not present

## 2021-07-01 DIAGNOSIS — R000 Tachycardia, unspecified: Secondary | ICD-10-CM | POA: Diagnosis not present

## 2021-07-01 HISTORY — DX: Essential (primary) hypertension: I10

## 2021-07-01 HISTORY — DX: Age-related osteoporosis without current pathological fracture: M81.0

## 2021-07-01 LAB — URINALYSIS, ROUTINE W REFLEX MICROSCOPIC
Bilirubin Urine: NEGATIVE
Glucose, UA: NEGATIVE mg/dL
Ketones, ur: NEGATIVE mg/dL
Leukocytes,Ua: NEGATIVE
Nitrite: NEGATIVE
Protein, ur: NEGATIVE mg/dL
Specific Gravity, Urine: 1.016 (ref 1.005–1.030)
pH: 5.5 (ref 5.0–8.0)

## 2021-07-01 LAB — CBC
HCT: 40.7 % (ref 36.0–46.0)
Hemoglobin: 13.5 g/dL (ref 12.0–15.0)
MCH: 32 pg (ref 26.0–34.0)
MCHC: 33.2 g/dL (ref 30.0–36.0)
MCV: 96.4 fL (ref 80.0–100.0)
Platelets: 360 10*3/uL (ref 150–400)
RBC: 4.22 MIL/uL (ref 3.87–5.11)
RDW: 12.1 % (ref 11.5–15.5)
WBC: 12.8 10*3/uL — ABNORMAL HIGH (ref 4.0–10.5)
nRBC: 0 % (ref 0.0–0.2)

## 2021-07-01 LAB — BASIC METABOLIC PANEL
Anion gap: 11 (ref 5–15)
BUN: 19 mg/dL (ref 8–23)
CO2: 23 mmol/L (ref 22–32)
Calcium: 10.5 mg/dL — ABNORMAL HIGH (ref 8.9–10.3)
Chloride: 99 mmol/L (ref 98–111)
Creatinine, Ser: 0.84 mg/dL (ref 0.44–1.00)
GFR, Estimated: >60 mL/min (ref >60)
Glucose, Bld: 114 mg/dL — ABNORMAL HIGH (ref 70–99)
Potassium: 3.7 mmol/L (ref 3.5–5.1)
Sodium: 133 mmol/L — ABNORMAL LOW (ref 135–145)

## 2021-07-01 NOTE — ED Triage Notes (Signed)
About 5:20 she started having nausea, sudden diarrhea, shaking and weakness and her husband checked her bp and it was 86/54. Now, she feels lightheaded, body tremors, headache. Has been having headaches recently.

## 2021-07-02 DIAGNOSIS — R519 Headache, unspecified: Secondary | ICD-10-CM | POA: Diagnosis not present

## 2021-07-02 LAB — TROPONIN I (HIGH SENSITIVITY): Troponin I (High Sensitivity): 4 ng/L (ref <18)

## 2021-07-02 NOTE — Discharge Instructions (Signed)
You were evaluated in the Emergency Department and after careful evaluation, we did not find any emergent condition requiring admission or further testing in the hospital.  Your exam/testing today is overall reassuring.  Your symptoms seem explained by an episode of hypotension.  Recommend close follow-up with your primary care doctor to discuss your symptoms and blood pressure medicines.  Would pause your recently added blood pressure medicine until you see your doctor.  Please return to the Emergency Department if you experience any worsening of your condition.   Thank you for allowing [place] to be a part of your care.

## 2021-07-02 NOTE — ED Provider Notes (Signed)
[facility] EMERGENCY [facility] Emergency Department Provider Note MRN:  [id number]  Arrival date & time: 07/02/21     Chief Complaint   Dizziness   History of Present Illness   Susan Woodward is a 65 y.o. year-old female with a history of hypertension presenting to the ED with chief complaint of dizziness.  At about 5 PM today patient experienced a sudden onset of nausea, diarrhea, dizziness described as a lightheadedness.  Had to sit or lay down which improved her symptoms.  Blood pressure was measured in the 80s systolic by husband.  Has been having worsening new headaches over the past 4 days as well.  Denies any numbness or weakness to the arms or legs, no abdominal pain, no chest pain or shortness of breath.  No sick contacts, no recent fever.  Feeling better at this time.  Has been started on a new blood pressure medication last week.  Review of Systems  A complete 10 system review of systems was obtained and all systems are negative except as noted in the HPI and PMH.   Patient's Health History    Past Medical History:  Diagnosis Date   Hypertension    Osteoporosis     History reviewed. No pertinent surgical history.  No family history on file.  Social History   Socioeconomic History   Marital status: Married    Spouse name: Not on file   Number of children: Not on file   Years of education: Not on file   Highest education level: Not on file  Occupational History   Not on file  Tobacco Use   Smoking status: Never   Smokeless tobacco: Never  Substance and Sexual Activity   Alcohol use: Not Currently   Drug use: Not Currently   Sexual activity: Not on file  Other Topics Concern   Not on file  Social History Narrative   Not on file   Social Determinants of Health   Financial Resource Strain: Not on file  Food Insecurity: Not on file  Transportation Needs: Not on file  Physical Activity: Not on file  Stress: Not on file  Social Connections: Not on file   Intimate Partner Violence: Not on file     Physical Exam   Vitals:   07/01/21 2302 07/01/21 2330  BP: (!) 141/80 127/82  Pulse: 89 87  Resp: 16   Temp:    SpO2: 97% 98%    CONSTITUTIONAL: Well-appearing, NAD NEURO:  Alert and oriented x 3, normal and symmetric strength and sensation, normal coordination, normal speech EYES:  eyes equal and reactive ENT/NECK:  no LAD, no JVD CARDIO: Regular rate, well-perfused, normal S1 and S2 PULM:  CTAB no wheezing or rhonchi GI/GU:  normal bowel sounds, non-distended, non-tender MSK/SPINE:  No gross deformities, no edema SKIN:  no rash, atraumatic PSYCH:  Appropriate speech and behavior  *Additional and/or pertinent findings included in MDM below  Diagnostic and Interventional Summary    EKG Interpretation  Date/Time:  Monday July 01 2021 20:30:53 EST Ventricular Rate:  103 PR Interval:  126 QRS Duration: 74 QT Interval:  314 QTC Calculation: 411 R Axis:   35 Text Interpretation: Sinus tachycardia Anterior infarct , age undetermined Abnormal ECG No old tracing to compare Confirmed by Susy Frizzle [phone]) on 07/01/2021 8:37:43 PM       Labs Reviewed  BASIC METABOLIC PANEL - Abnormal; Notable for the following components:      Result Value   Sodium 133 (*)  Glucose, Bld 114 (*)    Calcium 10.5 (*)    All other components within normal limits  CBC - Abnormal; Notable for the following components:   WBC 12.8 (*)    All other components within normal limits  URINALYSIS, ROUTINE W REFLEX MICROSCOPIC - Abnormal; Notable for the following components:   Color, Urine COLORLESS (*)    Hgb urine dipstick MODERATE (*)    All other components within normal limits  TROPONIN I (HIGH SENSITIVITY)    CT HEAD WO CONTRAST ( )  Final Result      Medications - No data to display   Procedures  /  Critical Care Procedures  ED Course and Medical Decision Making  I have reviewed the triage vital signs, the nursing notes, and  pertinent available records from the EMR.  Listed above are laboratory and imaging tests that I personally ordered, reviewed, and interpreted and then considered in my medical decision making (see below for details).  Symptoms seem to be due to a hypotensive episode, suspect due to blood pressure medications recently added on.  She has no infectious symptoms, she currently has normal vital signs, normal blood pressure.  No chest pain or shortness of breath though her EKG is demonstrating some possible ST depressions laterally, no prior for comparison.  Will obtain troponin to screen for ACS.  We will also obtain CT head given recent new headaches.  If reassuring work-up suspect patient can be discharged with close PCP follow-up.       Elmer Sow. Pilar Plate, MD [facility] Emergency Medicine [facility] [email or web address]  Final Clinical Impressions(s) / ED Diagnoses     ICD-10-CM   1. Hypotension, unspecified hypotension type  I95.9       ED Discharge Orders     None        Discharge Instructions Discussed with and Provided to Patient:     Discharge Instructions      You were evaluated in the Emergency Department and after careful evaluation, we did not find any emergent condition requiring admission or further testing in the hospital.  Your exam/testing today is overall reassuring.  Your symptoms seem explained by an episode of hypotension.  Recommend close follow-up with your primary care doctor to discuss your symptoms and blood pressure medicines.  Would pause your recently added blood pressure medicine until you see your doctor.  Please return to the Emergency Department if you experience any worsening of your condition.   Thank you for allowing [place] to be a part of your care.        Sabas Sous, MD 07/02/21 [phone]

## 2021-07-04 NOTE — Progress Notes (Signed)
Cardiology Office Note:    Date:  07/04/2021   ID:  Susan Woodward, DOB 1956-03-07, MRN [id number]  PCP:  Juluis Rainier, MD (Inactive)   [facility] Providers Cardiologist:  None     Referring MD: Sabas Sous, MD   No chief complaint on file. Hypotension/Hypertension  History of Present Illness:    Susan Woodward is a 65 y.o. female with a hx of HTN,  referral for dizziness , blood pressure fluctuations   She notes that she was dizzy,clammy and nauseous. She felt she could not walk. Bps were low checked by her husband. Then blood pressures increased  Per ED note: At about 5 PM today patient experienced a sudden onset of nausea, diarrhea, dizziness described as a lightheadedness.  Had to sit or lay down which improved her symptoms.  Blood pressure was measured in the 80s systolic by husband.  Has been having worsening new headaches over the past 4 days as well.  Denies any numbness or weakness to the arms or legs, no abdominal pain, no chest pain or shortness of breath.  No sick contacts, no recent fever.  Feeling better at this time.  Has been started on a new blood pressure medication last week. In  terms of her Bps, She had no significant leukocytosis. No evidence of shock. She had head CT which was normal.  Her blood pressure is 95/60 mmHg. She gets headaches every 3 days. No nausea or dizziness. No migraine hx. She felt in a fog this week and forgetful. She's had BP fluctuations. She is taking valsartan 80 mg daily. This has been going on since over the summer. She was getting cortisone injections in her back.  Q 4 weeks for 3 months. She stopped.  No cardiac hx. No stress test. She denies angina, dyspnea on exertion, lower extremity edema, PND or orthopnea.    Family Hx: of aneurysm in mother and HTN. Father had HTN. Siblings no disease.Two children with hypertension  Social Hx: smoked in 30s and then quit. No etoh, drug use.    Sinus tachycardia, poor R wave  progression  Past Medical History:  Diagnosis Date   Hypertension    Osteoporosis     No past surgical history on file.  Current Medications: No outpatient medications have been marked as taking for the 07/05/21 encounter (Appointment) with Maisie Fus, MD.     Allergies:   Patient has no known allergies.   Social History   Socioeconomic History   Marital status: Married    Spouse name: Not on file   Number of children: Not on file   Years of education: Not on file   Highest education level: Not on file  Occupational History   Not on file  Tobacco Use   Smoking status: Never   Smokeless tobacco: Never  Substance and Sexual Activity   Alcohol use: Not Currently   Drug use: Not Currently   Sexual activity: Not on file  Other Topics Concern   Not on file  Social History Narrative   Not on file   Social Determinants of Health   Financial Resource Strain: Not on file  Food Insecurity: Not on file  Transportation Needs: Not on file  Physical Activity: Not on file  Stress: Not on file  Social Connections: Not on file     Family History: The patient's per above family history is not on file.  ROS:   Please see the history of present illness.  All other systems reviewed and are negative.  EKGs/Labs/Other Studies Reviewed:    The following studies were reviewed today:   EKG:  EKG is  ordered today.  The ekg ordered today demonstrates   NSR,no ischemic changes   Recent Labs: 07/01/2021: BUN 19; Creatinine, Ser 0.84; Hemoglobin 13.5; Platelets 360; Potassium 3.7; Sodium 133  Recent Lipid Panel No results found for: CHOL, TRIG, HDL, CHOLHDL, VLDL, LDLCALC, LDLDIRECT   Risk Assessment/Calculations:           Physical Exam:    VS:  There were no vitals taken for this visit.    Wt Readings from Last 3 Encounters:  07/01/21 113 lb (51.3 kg)  01/25/19 116 lb (52.6 kg)  08/05/17 120 lb (54.4 kg)     GEN:  Well nourished, well developed in no  acute distress HEENT: Normal NECK: No JVD; No carotid bruits LYMPHATICS: No lymphadenopathy CARDIAC: RRR, no murmurs, rubs, gallops RESPIRATORY:  Clear to auscultation without rales, wheezing or rhonchi  ABDOMEN: Soft, non-tender, non-distended MUSCULOSKELETAL:  No edema; No deformity  SKIN: Warm and dry NEUROLOGIC:  Alert and oriented x 3 PSYCHIATRIC:  Normal affect   ASSESSMENT:    #Blood pressure fluctuations: She reports paroxysms of headache , dizziness and episode of nausea. Her SBP 80s/90s to 170s systolic. Concern for possible pheochromocytoma. Further, seems less likely but she had cortisone injections and may have adrenal insufficiency, recommend work up with her family practice provider. Otherwise, she does not have signs/symptoms of cardiac disease and typically Bps with mild elevation. For now just continue valsartan 80 mg daily and hold norvasc 5 mg daily. PLAN:    In order of problems listed above:  Urine/plasma catecholamines and metanephrines; if positive will notify PCP Adrenal w/u per primary provider if indicated Follow up PRN        Medication Adjustments/Labs and Tests Ordered: Current medicines are reviewed at length with the patient today.  Concerns regarding medicines are outlined above.    Signed, Maisie Fus, MD  07/04/2021 6:25 PM    [facility]

## 2021-07-05 ENCOUNTER — Ambulatory Visit: Payer: BC Managed Care – PPO | Admitting: Internal Medicine

## 2021-07-05 ENCOUNTER — Encounter: Payer: Self-pay | Admitting: Internal Medicine

## 2021-07-05 ENCOUNTER — Other Ambulatory Visit: Payer: Self-pay

## 2021-07-05 VITALS — BP 170/82 | HR 74 | Ht 59.0 in | Wt 117.8 lb

## 2021-07-05 DIAGNOSIS — R519 Headache, unspecified: Secondary | ICD-10-CM | POA: Diagnosis not present

## 2021-07-05 DIAGNOSIS — I10 Essential (primary) hypertension: Secondary | ICD-10-CM | POA: Diagnosis not present

## 2021-07-05 DIAGNOSIS — M542 Cervicalgia: Secondary | ICD-10-CM | POA: Diagnosis not present

## 2021-07-05 NOTE — Patient Instructions (Addendum)
Medication Instructions:  No Changes In Medications at this time.  *If you need a refill on your cardiac medications before your next appointment, please call your pharmacy*  Lab Work: METANEPHRINES AND CATECHOLAMINES URINE AND BLOOD WORK If you have labs (blood work) drawn today and your tests are completely normal, you will receive your results only by: MyChart Message (if you have MyChart) OR A paper copy in the mail If you have any lab test that is abnormal or we need to change your treatment, we will call you to review the results.  Follow-Up: At [facility], you and your health needs are our priority.  As part of our continuing mission to provide you with exceptional heart care, we have created designated Provider Care Teams.  These Care Teams include your primary Cardiologist (physician) and Advanced Practice Providers (APPs -  Physician Assistants and Nurse Practitioners) who all work together to provide you with the care you need, when you need it.  Your next appointment:   AS NEEDED   The format for your next appointment:   In Person  Provider:   Dr. Wyline Mood

## 2021-07-08 DIAGNOSIS — I10 Essential (primary) hypertension: Secondary | ICD-10-CM | POA: Diagnosis not present

## 2021-07-11 LAB — METANEPHRINES, PLASMA
Metanephrine, Free: 74.3 pg/mL (ref 0.0–88.0)
Normetanephrine, Free: 153.9 pg/mL (ref 0.0–285.2)

## 2021-07-11 LAB — CATECHOLAMINES, FRACTIONATED, PLASMA
Dopamine: 62 pg/mL — ABNORMAL HIGH (ref 0–48)
Epinephrine: 40 pg/mL (ref 0–62)
Norepinephrine: 919 pg/mL — ABNORMAL HIGH (ref 0–874)

## 2021-07-12 ENCOUNTER — Telehealth: Payer: Self-pay | Admitting: *Deleted

## 2021-07-12 DIAGNOSIS — E278 Other specified disorders of adrenal gland: Secondary | ICD-10-CM

## 2021-07-12 LAB — METANEPHRINES, URINE, 24 HOUR
Metaneph Total, Ur: 40 ug/L
Metanephrines, 24H Ur: 120 ug/24 hr (ref 36–209)
Normetanephrine, 24H Ur: 273 ug/24 hr (ref 131–612)
Normetanephrine, Ur: 91 ug/L

## 2021-07-12 LAB — CATECHOLAMINES, FRACTIONATED, URINE, 24 HOUR
Dopamine , 24H Ur: 165 ug/24 hr (ref 0–510)
Dopamine, Rand Ur: 55 ug/L
Epinephrine, 24H Ur: 6 ug/24 hr (ref 0–20)
Epinephrine, Rand Ur: 2 ug/L
Norepinephrine, 24H Ur: 51 ug/24 hr (ref 0–135)
Norepinephrine, Rand Ur: 17 ug/L

## 2021-07-12 NOTE — Telephone Encounter (Signed)
-----   Message from Maisie Fus, MD sent at 07/11/2021  5:08 PM EST ----- Timmie Foerster, please let Mrs Magno know that her lab tests are concerning for an adrenal gland issue.  Please let her know and then please order a non contrast CT abdomen and pelvis for adrenal mass.

## 2021-07-12 NOTE — Telephone Encounter (Signed)
Spoke with pt, aware of the recommendations. Order placed and sent to pre-cert.

## 2021-07-17 ENCOUNTER — Telehealth: Payer: Self-pay | Admitting: Internal Medicine

## 2021-07-17 ENCOUNTER — Other Ambulatory Visit: Payer: Self-pay

## 2021-07-17 DIAGNOSIS — E278 Other specified disorders of adrenal gland: Secondary | ICD-10-CM

## 2021-07-17 NOTE — Telephone Encounter (Signed)
Re-Ordered testing.  Thanks!

## 2021-07-17 NOTE — Telephone Encounter (Signed)
Susan Woodward from [facility] calling to request the CT order be changed. She says it needs to be changed to a CT abdomen without. She says she does not need a call back.

## 2021-07-18 DIAGNOSIS — R519 Headache, unspecified: Secondary | ICD-10-CM | POA: Diagnosis not present

## 2021-07-18 DIAGNOSIS — I10 Essential (primary) hypertension: Secondary | ICD-10-CM | POA: Diagnosis not present

## 2021-07-18 DIAGNOSIS — M542 Cervicalgia: Secondary | ICD-10-CM | POA: Diagnosis not present

## 2021-07-26 ENCOUNTER — Telehealth: Payer: Self-pay | Admitting: Internal Medicine

## 2021-07-26 DIAGNOSIS — Z01812 Encounter for preprocedural laboratory examination: Secondary | ICD-10-CM

## 2021-07-26 DIAGNOSIS — I10 Essential (primary) hypertension: Secondary | ICD-10-CM

## 2021-07-26 NOTE — Telephone Encounter (Signed)
Taryn from [facility] calling to request a bmet order for the patient's CT 08/06/2021.

## 2021-07-26 NOTE — Telephone Encounter (Signed)
Called patient left message on personal voice mail will need to have a bmet done before ct of abdomen.Advised can have done next week.Order placed.

## 2021-07-30 ENCOUNTER — Ambulatory Visit: Payer: BC Managed Care – PPO | Admitting: Internal Medicine

## 2021-07-30 DIAGNOSIS — Z01812 Encounter for preprocedural laboratory examination: Secondary | ICD-10-CM | POA: Diagnosis not present

## 2021-07-30 DIAGNOSIS — I10 Essential (primary) hypertension: Secondary | ICD-10-CM | POA: Diagnosis not present

## 2021-07-30 LAB — BASIC METABOLIC PANEL
BUN/Creatinine Ratio: 15 (ref 12–28)
BUN: 12 mg/dL (ref 8–27)
CO2: 26 mmol/L (ref 20–29)
Calcium: 9.3 mg/dL (ref 8.7–10.3)
Chloride: 100 mmol/L (ref 96–106)
Creatinine, Ser: 0.79 mg/dL (ref 0.57–1.00)
Glucose: 67 mg/dL — ABNORMAL LOW (ref 70–99)
Potassium: 5 mmol/L (ref 3.5–5.2)
Sodium: 139 mmol/L (ref 134–144)
eGFR: 83 mL/min/{1.73_m2} (ref >59)

## 2021-07-31 DIAGNOSIS — U071 COVID-19: Secondary | ICD-10-CM | POA: Diagnosis not present

## 2021-08-06 ENCOUNTER — Other Ambulatory Visit: Payer: Self-pay

## 2021-08-06 ENCOUNTER — Ambulatory Visit (INDEPENDENT_AMBULATORY_CARE_PROVIDER_SITE_OTHER)
Admission: RE | Admit: 2021-08-06 | Discharge: 2021-08-06 | Disposition: A | Discharge status: Home / Self Care | Payer: BC Managed Care – PPO | Source: Ambulatory Visit | Attending: Internal Medicine | Admitting: Internal Medicine

## 2021-08-06 DIAGNOSIS — I700 Atherosclerosis of aorta: Secondary | ICD-10-CM | POA: Diagnosis not present

## 2021-08-06 DIAGNOSIS — N289 Disorder of kidney and ureter, unspecified: Secondary | ICD-10-CM | POA: Diagnosis not present

## 2021-08-06 DIAGNOSIS — K5730 Diverticulosis of large intestine without perforation or abscess without bleeding: Secondary | ICD-10-CM | POA: Diagnosis not present

## 2021-08-06 DIAGNOSIS — E278 Other specified disorders of adrenal gland: Secondary | ICD-10-CM | POA: Diagnosis not present

## 2021-08-06 MED ORDER — IOHEXOL 300 MG/ML  SOLN
80.0000 mL | Freq: Once | INTRAMUSCULAR | Status: AC | PRN
  Administered 2021-08-06: 100 mL via INTRAVENOUS

## 2021-08-08 ENCOUNTER — Encounter: Payer: Self-pay | Admitting: Internal Medicine

## 2021-08-14 ENCOUNTER — Other Ambulatory Visit: Payer: Self-pay | Admitting: Neurosurgery

## 2021-08-21 NOTE — Progress Notes (Signed)
Surgical Instructions    Your procedure is scheduled on Monday, August 26, 2021 at 8:00 AM.  Report to [facility] Main Entrance "A" at 6:00 A.M., then check in with the Admitting office.  Call this number if you have problems the morning of surgery:  [phone]   If you have any questions prior to your surgery date call [phone]: Open Monday-Friday 8am-4pm    Remember:  Do not eat or drink after midnight the night before your surgery    Take these medicines the morning of surgery with A SIP OF WATER:  acetaminophen (TYLENOL) omeprazole (PRILOSEC)   As of today, STOP taking any Aspirin (unless otherwise instructed by your surgeon) Aleve, Naproxen, Ibuprofen, Motrin, Advil, meloxicam (MOBIC), Goody's, BC's, all herbal medications, fish oil, and all vitamins.                     Do NOT Smoke (Tobacco/Vaping) for 24 hours prior to your procedure.  If you use a CPAP at night, you may bring your mask/headgear for your overnight stay.   Contacts, glasses, piercing's, hearing aid's, dentures or partials may not be worn into surgery, please bring cases for these belongings.    For patients admitted to the hospital, discharge time will be determined by your treatment team.   Patients discharged the day of surgery will not be allowed to drive home, and someone needs to stay with them for 24 hours.  NO VISITORS WILL BE ALLOWED IN PRE-OP WHERE PATIENTS ARE PREPPED FOR SURGERY.  ONLY 1 SUPPORT PERSON MAY BE PRESENT IN THE WAITING ROOM WHILE YOU ARE IN SURGERY.  IF YOU ARE TO BE ADMITTED, ONCE YOU ARE IN YOUR ROOM YOU WILL BE ALLOWED TWO (2) VISITORS. (1) VISITOR MAY STAY OVERNIGHT BUT MUST ARRIVE TO THE ROOM BY 8pm.  Minor children may have two parents present. Special consideration for safety and communication needs will be reviewed on a case by case basis.   Special instructions:   [place]- Preparing For Surgery  Before surgery, you can play an important role. Because skin is  not sterile, your skin needs to be as free of germs as possible. You can reduce the number of germs on your skin by washing with CHG (chlorahexidine gluconate) Soap before surgery.  CHG is an antiseptic cleaner which kills germs and bonds with the skin to continue killing germs even after washing.    Oral Hygiene is also important to reduce your risk of infection.  Remember - BRUSH YOUR TEETH THE MORNING OF SURGERY WITH YOUR REGULAR TOOTHPASTE  Please do not use if you have an allergy to CHG or antibacterial soaps. If your skin becomes reddened/irritated stop using the CHG.  Do not shave (including legs and underarms) for at least 48 hours prior to first CHG shower. It is OK to shave your face.  Please follow these instructions carefully.   Shower the NIGHT BEFORE SURGERY and the MORNING OF SURGERY  If you chose to wash your hair, wash your hair first as usual with your normal shampoo.  After you shampoo, rinse your hair and body thoroughly to remove the shampoo.  Use CHG Soap as you would any other liquid soap. You can apply CHG directly to the skin and wash gently with a scrungie or a clean washcloth.   Apply the CHG Soap to your body ONLY FROM THE NECK DOWN.  Do not use on open wounds or open sores. Avoid contact with your eyes, ears, mouth  and genitals (private parts). Wash Face and genitals (private parts)  with your normal soap.   Wash thoroughly, paying special attention to the area where your surgery will be performed.  Thoroughly rinse your body with warm water from the neck down.  DO NOT shower/wash with your normal soap after using and rinsing off the CHG Soap.  Pat yourself dry with a CLEAN TOWEL.  Wear CLEAN PAJAMAS to bed the night before surgery  Place CLEAN SHEETS on your bed the night before your surgery  DO NOT SLEEP WITH PETS.   Day of Surgery: Shower with CHG soap. Do not wear jewelry, make up, nail polish, gel polish, artificial nails, or any other type of  covering on natural nails including finger and toenails. If patients have artificial nails, gel coating, etc. that need to be removed by a nail salon please have this removed prior to surgery. Surgery may need to be canceled/delayed if the surgeon/anesthesiologist feels like the patient is unable to be adequately monitored. Do not wear lotions, powders, perfumes, or deodorant. Do not shave 48 hours prior to surgery. Do not bring valuables to the hospital. [facility] is not responsible for any belongings or valuables. Wear Clean/Comfortable clothing the morning of surgery Remember to brush your teeth WITH YOUR REGULAR TOOTHPASTE.   Please read over the following fact sheets that you were given.   3 days prior to your procedure or After your COVID test   You are not required to quarantine however you are required to wear a well-fitting mask when you are out and around people not in your household. If your mask becomes wet or soiled, replace with a new one.   Wash your hands often with soap and water for 20 seconds or clean your hands with an alcohol-based hand sanitizer that contains at least 60% alcohol.   Do not share personal items.   Notify your provider:  o if you are in close contact with someone who has COVID  o or if you develop a fever of 100.4 or greater, sneezing, cough, sore throat, shortness of breath or body aches.

## 2021-08-22 ENCOUNTER — Other Ambulatory Visit: Payer: Self-pay

## 2021-08-22 ENCOUNTER — Encounter (HOSPITAL_COMMUNITY): Payer: Self-pay

## 2021-08-22 ENCOUNTER — Encounter (HOSPITAL_COMMUNITY)
Admission: RE | Admit: 2021-08-22 | Discharge: 2021-08-22 | Disposition: A | Discharge status: Home / Self Care | Payer: BC Managed Care – PPO | Source: Ambulatory Visit | Attending: Neurosurgery | Admitting: Neurosurgery

## 2021-08-22 VITALS — BP 129/71 | HR 90 | Temp 98.2°F | Resp 17 | Ht 59.0 in | Wt 119.0 lb

## 2021-08-22 DIAGNOSIS — Z01818 Encounter for other preprocedural examination: Secondary | ICD-10-CM | POA: Diagnosis not present

## 2021-08-22 LAB — TYPE AND SCREEN
ABO/RH(D): A POS
Antibody Screen: NEGATIVE

## 2021-08-22 LAB — CBC
HCT: 40.9 % (ref 36.0–46.0)
Hemoglobin: 13 g/dL (ref 12.0–15.0)
MCH: 31.4 pg (ref 26.0–34.0)
MCHC: 31.8 g/dL (ref 30.0–36.0)
MCV: 98.8 fL (ref 80.0–100.0)
Platelets: 373 10*3/uL (ref 150–400)
RBC: 4.14 MIL/uL (ref 3.87–5.11)
RDW: 11.8 % (ref 11.5–15.5)
WBC: 8.5 10*3/uL (ref 4.0–10.5)
nRBC: 0 % (ref 0.0–0.2)

## 2021-08-22 LAB — SURGICAL PCR SCREEN
MRSA, PCR: NEGATIVE
Staphylococcus aureus: NEGATIVE

## 2021-08-22 NOTE — Progress Notes (Signed)
PCP - Peri Maris FNP Cardiologist - Denies  PPM/ICD - Denies  Chest x-ray - N/A EKG - 08/22/21 Stress Test - Denies ECHO - Denies Cardiac Cath - Denies  Sleep Study - Denies  Patient is not diabetic.  Blood Thinner Instructions: N/A Aspirin Instructions: N/A  ERAS Protcol - No  COVID TEST- No; Patient had a positive home test on 07/30/21.   Anesthesia review: Yes, cardiac hx; abnormal EKG  Patient denies shortness of breath, fever, cough and chest pain at PAT appointment   All instructions explained to the patient, with a verbal understanding of the material. Patient agrees to go over the instructions while at home for a better understanding. Patient also instructed to self quarantine after being tested for COVID-19. The opportunity to ask questions was provided.

## 2021-08-23 NOTE — Progress Notes (Signed)
Anesthesia Chart Review:  Patient recently evaluated by cardiology for episodes of lightheadedness and dizziness.  She was seen by Dr. Carolan Clines on 07/05/2021.  Per note, " She reports paroxysms of headache , dizziness and episode of nausea. Her SBP 80s/90s to 170s systolic. Concern for possible pheochromocytoma. Further, seems less likely but she had cortisone injections and may have adrenal insufficiency, recommend work up with her family practice provider. Otherwise, she does not have signs/symptoms of cardiac disease and typically Bps with mild elevation. For now just continue valsartan 80 mg daily and hold norvasc 5 mg daily."  Lab work showed mildly elevated norepinephrine and dopamine levels.  Dr. Wyline Mood ordered CT abdomen pelvis to evaluate for adrenal mass.  Scan done on 08/06/2021 was normal, no abnormalities noted.  Dr. Wyline Mood commented on result and advised patient to follow-up with PCP for further evaluation of her symptoms.  I did review OV notes from pt's PCP Meridee Score, FNP. Per notes, he is aware of pt's recent cardiac workup and recommendations and continues to follow pt's labile BP.  BMP 07/30/21 reviewed, unremarkable. CBC 08/22/21 WNL.  EKG 08/22/2021: NSR.  Rate 82.  Low-voltage QRS.    Zannie Cove [facility] Stay [facility]/Anesthesiology Phone [phone] 08/23/2021 10:35 AM

## 2021-08-23 NOTE — Anesthesia Preprocedure Evaluation (Addendum)
Anesthesia Evaluation  Patient identified by MRN, date of birth, ID band Patient awake    Reviewed: Allergy & Precautions, NPO status , Patient's Chart, lab work & pertinent test results  History of Anesthesia Complications Negative for: history of anesthetic complications  Airway Mallampati: I  TM Distance: >3 FB Neck ROM: Full    Dental  (+) Teeth Intact, Dental Advisory Given   Pulmonary neg pulmonary ROS,    breath sounds clear to auscultation       Cardiovascular hypertension, Pt. on medications (-) angina(-) Past MI and (-) CHF  Rhythm:Regular     Neuro/Psych negative neurological ROS  negative psych ROS   GI/Hepatic Neg liver ROS, GERD  Medicated and Controlled,  Endo/Other  negative endocrine ROS  Renal/GU negative Renal ROS     Musculoskeletal negative musculoskeletal ROS (+)   Abdominal   Peds  Hematology negative hematology ROS (+)   Anesthesia Other Findings   Reproductive/Obstetrics                            Anesthesia Physical Anesthesia Plan  ASA: 2  Anesthesia Plan: General   Post-op Pain Management: Ofirmev IV (intra-op)   Induction: Intravenous  PONV Risk Score and Plan: 3 and Ondansetron and Dexamethasone  Airway Management Planned: Oral ETT  Additional Equipment: None  Intra-op Plan:   Post-operative Plan: Extubation in OR  Informed Consent: I have reviewed the patients History and Physical, chart, labs and discussed the procedure including the risks, benefits and alternatives for the proposed anesthesia with the patient or authorized representative who has indicated his/her understanding and acceptance.     Dental advisory given  Plan Discussed with: CRNA and Anesthesiologist  Anesthesia Plan Comments: (PAT note by Antionette Poles, PA-C: Patient recently evaluated by cardiology for episodes of lightheadedness and dizziness.  She was seen by Dr. Carolan Clines on 07/05/2021.  Per note, "She reports paroxysms of headache , dizziness and episode of nausea. Her SBP 80s/90s to 170s systolic. Concern for possible pheochromocytoma. Further, seems less likely but she had cortisone injections and may have adrenal insufficiency, recommend work up with her family practice provider. Otherwise, she does not have signs/symptoms of cardiac disease and typically Bps with mild elevation. For now just continue valsartan 80 mg daily and hold norvasc 5 mg daily."  Lab work showed mildly elevated norepinephrine and dopamine levels.  Dr. Wyline Mood ordered CT abdomen pelvis to evaluate for adrenal mass.  Scan done on 08/06/2021 was normal, no abnormalities noted.  Dr. Wyline Mood commented on result and advised patient to follow-up with PCP for further evaluation of her symptoms.  I did review OV notes from pt's PCP Meridee Score, FNP. Per notes, he is aware of pt's recent cardiac workup and recommendations and continues to follow pt's labile BP.  BMP 07/30/21 reviewed, unremarkable. CBC 08/22/21 WNL.  EKG 08/22/2021: NSR.  Rate 82.  Low-voltage QRS. )       Anesthesia Quick Evaluation

## 2021-08-26 ENCOUNTER — Ambulatory Visit (HOSPITAL_COMMUNITY): Payer: BC Managed Care – PPO | Admitting: Physician Assistant

## 2021-08-26 ENCOUNTER — Ambulatory Visit (HOSPITAL_COMMUNITY): Payer: BC Managed Care – PPO

## 2021-08-26 ENCOUNTER — Observation Stay (HOSPITAL_COMMUNITY)
Admission: RE | Admit: 2021-08-26 | Discharge: 2021-08-27 | Disposition: A | Discharge status: Home / Self Care | Payer: BC Managed Care – PPO | Source: Ambulatory Visit | Attending: Neurosurgery | Admitting: Neurosurgery

## 2021-08-26 ENCOUNTER — Encounter (HOSPITAL_COMMUNITY): Payer: Self-pay | Admitting: Neurosurgery

## 2021-08-26 ENCOUNTER — Encounter (HOSPITAL_COMMUNITY)
Admission: RE | Disposition: A | Discharge status: Home / Self Care | Payer: Self-pay | Source: Ambulatory Visit | Attending: Neurosurgery

## 2021-08-26 ENCOUNTER — Ambulatory Visit (HOSPITAL_COMMUNITY): Payer: BC Managed Care – PPO | Admitting: Certified Registered"

## 2021-08-26 ENCOUNTER — Other Ambulatory Visit: Payer: Self-pay

## 2021-08-26 DIAGNOSIS — Z79899 Other long term (current) drug therapy: Secondary | ICD-10-CM | POA: Diagnosis not present

## 2021-08-26 DIAGNOSIS — M4310 Spondylolisthesis, site unspecified: Secondary | ICD-10-CM | POA: Diagnosis present

## 2021-08-26 DIAGNOSIS — M48061 Spinal stenosis, lumbar region without neurogenic claudication: Secondary | ICD-10-CM | POA: Insufficient documentation

## 2021-08-26 DIAGNOSIS — Z419 Encounter for procedure for purposes other than remedying health state, unspecified: Secondary | ICD-10-CM

## 2021-08-26 DIAGNOSIS — I10 Essential (primary) hypertension: Secondary | ICD-10-CM | POA: Insufficient documentation

## 2021-08-26 DIAGNOSIS — M532X6 Spinal instabilities, lumbar region: Secondary | ICD-10-CM | POA: Diagnosis not present

## 2021-08-26 DIAGNOSIS — Z981 Arthrodesis status: Secondary | ICD-10-CM | POA: Diagnosis not present

## 2021-08-26 DIAGNOSIS — M4316 Spondylolisthesis, lumbar region: Secondary | ICD-10-CM | POA: Diagnosis not present

## 2021-08-26 DIAGNOSIS — M4326 Fusion of spine, lumbar region: Secondary | ICD-10-CM | POA: Diagnosis not present

## 2021-08-26 LAB — ABO/RH: ABO/RH(D): A POS

## 2021-08-26 SURGERY — POSTERIOR LUMBAR FUSION 1 LEVEL
Anesthesia: General | Site: Back

## 2021-08-26 MED ORDER — MENTHOL 3 MG MT LOZG: 1.0000 | LOZENGE | OROMUCOSAL | Status: DC | PRN

## 2021-08-26 MED ORDER — CEFAZOLIN SODIUM-DEXTROSE 2-4 GM/100ML-% IV SOLN
INTRAVENOUS | Status: AC
  Filled 2021-08-26: qty 100

## 2021-08-26 MED ORDER — BUPIVACAINE HCL (PF) 0.25 % IJ SOLN
INTRAMUSCULAR | Status: DC | PRN
  Administered 2021-08-26: 20 mL

## 2021-08-26 MED ORDER — OXYCODONE HCL 5 MG/5ML PO SOLN: 5.0000 mg | Freq: Once | ORAL | Status: DC | PRN

## 2021-08-26 MED ORDER — ONDANSETRON HCL 4 MG PO TABS: 4.0000 mg | ORAL_TABLET | Freq: Four times a day (QID) | ORAL | Status: DC | PRN

## 2021-08-26 MED ORDER — CHLORHEXIDINE GLUCONATE 0.12 % MT SOLN
OROMUCOSAL | Status: AC
  Filled 2021-08-26: qty 15

## 2021-08-26 MED ORDER — ROCURONIUM BROMIDE 10 MG/ML (PF) SYRINGE
PREFILLED_SYRINGE | INTRAVENOUS | Status: DC | PRN
  Administered 2021-08-26 (×2): 20 mg via INTRAVENOUS
  Administered 2021-08-26: 60 mg via INTRAVENOUS

## 2021-08-26 MED ORDER — POTASSIUM 99 MG PO TABS: 99.0000 mg | ORAL_TABLET | ORAL | Status: DC

## 2021-08-26 MED ORDER — OXYCODONE HCL 5 MG PO TABS: 5.0000 mg | ORAL_TABLET | Freq: Once | ORAL | Status: DC | PRN

## 2021-08-26 MED ORDER — IRBESARTAN 150 MG PO TABS: 75.0000 mg | ORAL_TABLET | Freq: Every day | ORAL | Status: DC

## 2021-08-26 MED ORDER — FLEET ENEMA 7-19 GM/118ML RE ENEM: 1.0000 | ENEMA | Freq: Once | RECTAL | Status: DC | PRN

## 2021-08-26 MED ORDER — OXYCODONE HCL 5 MG PO TABS: 10.0000 mg | ORAL_TABLET | ORAL | Status: DC | PRN

## 2021-08-26 MED ORDER — POLYETHYLENE GLYCOL 3350 17 G PO PACK: 17.0000 g | PACK | Freq: Every day | ORAL | Status: DC | PRN

## 2021-08-26 MED ORDER — BISACODYL 10 MG RE SUPP: 10.0000 mg | Freq: Every day | RECTAL | Status: DC | PRN

## 2021-08-26 MED ORDER — PROPOFOL 10 MG/ML IV BOLUS
INTRAVENOUS | Status: AC
  Filled 2021-08-26: qty 20

## 2021-08-26 MED ORDER — MIDAZOLAM HCL 2 MG/2ML IJ SOLN
INTRAMUSCULAR | Status: AC
  Filled 2021-08-26: qty 2

## 2021-08-26 MED ORDER — CHLORHEXIDINE GLUCONATE CLOTH 2 % EX PADS: 6.0000 | MEDICATED_PAD | Freq: Once | CUTANEOUS | Status: DC

## 2021-08-26 MED ORDER — SODIUM CHLORIDE 0.9 % IV SOLN: 250.0000 mL | INTRAVENOUS | Status: DC

## 2021-08-26 MED ORDER — ORAL CARE MOUTH RINSE: 15.0000 mL | Freq: Once | OROMUCOSAL | Status: AC

## 2021-08-26 MED ORDER — ACETAMINOPHEN 10 MG/ML IV SOLN
INTRAVENOUS | Status: AC
  Filled 2021-08-26: qty 100

## 2021-08-26 MED ORDER — OYSTER SHELL CALCIUM/D3 500-5 MG-MCG PO TABS
1.0000 | ORAL_TABLET | Freq: Every day | ORAL | Status: DC
  Administered 2021-08-27: 1 via ORAL
  Filled 2021-08-26 (×2): qty 1

## 2021-08-26 MED ORDER — OMEGA-3-ACID ETHYL ESTERS 1 G PO CAPS
1.0000 g | ORAL_CAPSULE | Freq: Two times a day (BID) | ORAL | Status: DC
  Administered 2021-08-27: 1 g via ORAL
  Filled 2021-08-26 (×3): qty 1

## 2021-08-26 MED ORDER — FENTANYL CITRATE (PF) 250 MCG/5ML IJ SOLN
INTRAMUSCULAR | Status: DC | PRN
  Administered 2021-08-26: 25 ug via INTRAVENOUS
  Administered 2021-08-26: 50 ug via INTRAVENOUS
  Administered 2021-08-26: 100 ug via INTRAVENOUS

## 2021-08-26 MED ORDER — HYDROMORPHONE HCL 1 MG/ML IJ SOLN
1.0000 mg | INTRAMUSCULAR | Status: DC | PRN
  Administered 2021-08-26 (×2): 1 mg via INTRAVENOUS
  Filled 2021-08-26 (×2): qty 1

## 2021-08-26 MED ORDER — ACETAMINOPHEN 325 MG PO TABS: 650.0000 mg | ORAL_TABLET | ORAL | Status: DC | PRN

## 2021-08-26 MED ORDER — ONDANSETRON HCL 4 MG/2ML IJ SOLN
4.0000 mg | Freq: Four times a day (QID) | INTRAMUSCULAR | Status: DC | PRN
  Administered 2021-08-26 (×2): 4 mg via INTRAVENOUS
  Filled 2021-08-26 (×2): qty 2

## 2021-08-26 MED ORDER — DIAZEPAM 5 MG PO TABS
5.0000 mg | ORAL_TABLET | Freq: Four times a day (QID) | ORAL | Status: DC | PRN
  Administered 2021-08-26 (×2): 5 mg via ORAL
  Filled 2021-08-26 (×2): qty 1

## 2021-08-26 MED ORDER — LIDOCAINE 2% (20 MG/ML) 5 ML SYRINGE
INTRAMUSCULAR | Status: DC | PRN
  Administered 2021-08-26: 50 mg via INTRAVENOUS

## 2021-08-26 MED ORDER — PHENYLEPHRINE 40 MCG/ML (10ML) SYRINGE FOR IV PUSH (FOR BLOOD PRESSURE SUPPORT)
PREFILLED_SYRINGE | INTRAVENOUS | Status: DC | PRN
Start: 2021-08-26 — End: 2021-08-26
  Administered 2021-08-26: 80 ug via INTRAVENOUS
  Administered 2021-08-26 (×3): 120 ug via INTRAVENOUS
  Administered 2021-08-26: 80 ug via INTRAVENOUS
  Administered 2021-08-26: 120 ug via INTRAVENOUS

## 2021-08-26 MED ORDER — HYDROCODONE-ACETAMINOPHEN 10-325 MG PO TABS
1.0000 | ORAL_TABLET | ORAL | Status: DC | PRN
  Administered 2021-08-26 – 2021-08-27 (×4): 1 via ORAL
  Filled 2021-08-26 (×4): qty 1

## 2021-08-26 MED ORDER — THROMBIN 20000 UNITS EX SOLR
CUTANEOUS | Status: AC
  Filled 2021-08-26: qty 20000

## 2021-08-26 MED ORDER — LACTATED RINGERS IV SOLN: INTRAVENOUS | Status: DC

## 2021-08-26 MED ORDER — DEXAMETHASONE SODIUM PHOSPHATE 10 MG/ML IJ SOLN
INTRAMUSCULAR | Status: DC | PRN
Start: 2021-08-26 — End: 2021-08-26
  Administered 2021-08-26: 5 mg via INTRAVENOUS

## 2021-08-26 MED ORDER — VANCOMYCIN HCL 1000 MG IV SOLR
INTRAVENOUS | Status: AC
  Filled 2021-08-26: qty 20

## 2021-08-26 MED ORDER — PHENYLEPHRINE HCL-NACL 20-0.9 MG/250ML-% IV SOLN
INTRAVENOUS | Status: DC | PRN
  Administered 2021-08-26: 40 ug/min via INTRAVENOUS

## 2021-08-26 MED ORDER — B COMPLEX-C PO TABS
1.0000 | ORAL_TABLET | Freq: Every day | ORAL | Status: DC
  Administered 2021-08-27: 1 via ORAL
  Filled 2021-08-26 (×2): qty 1

## 2021-08-26 MED ORDER — FENTANYL CITRATE (PF) 100 MCG/2ML IJ SOLN
INTRAMUSCULAR | Status: AC
  Filled 2021-08-26: qty 2

## 2021-08-26 MED ORDER — PANTOPRAZOLE SODIUM 40 MG PO TBEC
40.0000 mg | DELAYED_RELEASE_TABLET | Freq: Every day | ORAL | Status: DC
  Administered 2021-08-27: 40 mg via ORAL
  Filled 2021-08-26: qty 1

## 2021-08-26 MED ORDER — CEFAZOLIN SODIUM-DEXTROSE 1-4 GM/50ML-% IV SOLN
1.0000 g | Freq: Three times a day (TID) | INTRAVENOUS | Status: AC
  Administered 2021-08-26 (×2): 1 g via INTRAVENOUS
  Filled 2021-08-26 (×2): qty 50

## 2021-08-26 MED ORDER — PHENOL 1.4 % MT LIQD: 1.0000 | OROMUCOSAL | Status: DC | PRN

## 2021-08-26 MED ORDER — MIDAZOLAM HCL 2 MG/2ML IJ SOLN
INTRAMUSCULAR | Status: DC | PRN
  Administered 2021-08-26: 2 mg via INTRAVENOUS

## 2021-08-26 MED ORDER — THROMBIN 20000 UNITS EX SOLR
CUTANEOUS | Status: DC | PRN
  Administered 2021-08-26: 20 mL via TOPICAL

## 2021-08-26 MED ORDER — ACETAMINOPHEN 650 MG RE SUPP: 650.0000 mg | RECTAL | Status: DC | PRN

## 2021-08-26 MED ORDER — FENTANYL CITRATE (PF) 250 MCG/5ML IJ SOLN
INTRAMUSCULAR | Status: AC
  Filled 2021-08-26: qty 5

## 2021-08-26 MED ORDER — EPHEDRINE SULFATE-NACL 50-0.9 MG/10ML-% IV SOSY
PREFILLED_SYRINGE | INTRAVENOUS | Status: DC | PRN
Start: 2021-08-26 — End: 2021-08-26
  Administered 2021-08-26 (×3): 5 mg via INTRAVENOUS

## 2021-08-26 MED ORDER — FENTANYL CITRATE (PF) 100 MCG/2ML IJ SOLN
25.0000 ug | INTRAMUSCULAR | Status: DC | PRN
  Administered 2021-08-26: 50 ug via INTRAVENOUS
  Administered 2021-08-26 (×2): 25 ug via INTRAVENOUS

## 2021-08-26 MED ORDER — SODIUM CHLORIDE 0.9% FLUSH: 3.0000 mL | INTRAVENOUS | Status: DC | PRN

## 2021-08-26 MED ORDER — MAGNESIUM OXIDE -MG SUPPLEMENT 400 (240 MG) MG PO TABS: 400.0000 mg | ORAL_TABLET | Freq: Every evening | ORAL | Status: DC

## 2021-08-26 MED ORDER — CEFAZOLIN SODIUM-DEXTROSE 2-4 GM/100ML-% IV SOLN
2.0000 g | INTRAVENOUS | Status: AC
  Administered 2021-08-26: 2 g via INTRAVENOUS

## 2021-08-26 MED ORDER — PROPOFOL 10 MG/ML IV BOLUS
INTRAVENOUS | Status: DC | PRN
Start: 2021-08-26 — End: 2021-08-26
  Administered 2021-08-26: 130 mg via INTRAVENOUS

## 2021-08-26 MED ORDER — 0.9 % SODIUM CHLORIDE (POUR BTL) OPTIME
TOPICAL | Status: DC | PRN
Start: 2021-08-26 — End: 2021-08-26
  Administered 2021-08-26: 1000 mL

## 2021-08-26 MED ORDER — BUPIVACAINE HCL (PF) 0.25 % IJ SOLN
INTRAMUSCULAR | Status: AC
  Filled 2021-08-26: qty 30

## 2021-08-26 MED ORDER — VANCOMYCIN HCL 1000 MG IV SOLR
INTRAVENOUS | Status: DC | PRN
  Administered 2021-08-26: 1000 mg

## 2021-08-26 MED ORDER — ACETAMINOPHEN 160 MG/5ML PO SOLN: 1000.0000 mg | Freq: Once | ORAL | Status: DC | PRN

## 2021-08-26 MED ORDER — ONDANSETRON HCL 4 MG/2ML IJ SOLN
INTRAMUSCULAR | Status: DC | PRN
Start: 2021-08-26 — End: 2021-08-26
  Administered 2021-08-26: 4 mg via INTRAVENOUS

## 2021-08-26 MED ORDER — ACETAMINOPHEN 10 MG/ML IV SOLN
1000.0000 mg | Freq: Once | INTRAVENOUS | Status: DC | PRN
  Administered 2021-08-26: 1000 mg via INTRAVENOUS

## 2021-08-26 MED ORDER — SODIUM CHLORIDE 0.9% FLUSH: 3.0000 mL | Freq: Two times a day (BID) | INTRAVENOUS | Status: DC

## 2021-08-26 MED ORDER — CHLORHEXIDINE GLUCONATE 0.12 % MT SOLN
15.0000 mL | Freq: Once | OROMUCOSAL | Status: AC
  Administered 2021-08-26: 15 mL via OROMUCOSAL

## 2021-08-26 MED ORDER — ACETAMINOPHEN 500 MG PO TABS: 1000.0000 mg | ORAL_TABLET | Freq: Once | ORAL | Status: DC | PRN

## 2021-08-26 SURGICAL SUPPLY — 69 items
ADH SKN CLS APL DERMABOND .7 (GAUZE/BANDAGES/DRESSINGS) ×1
APL SKNCLS STERI-STRIP NONHPOA (GAUZE/BANDAGES/DRESSINGS) ×1
BAG COUNTER SPONGE SURGICOUNT (BAG) ×3 IMPLANT
BAG DECANTER FOR FLEXI CONT (MISCELLANEOUS) ×2 IMPLANT
BAG SPNG CNTER NS LX DISP (BAG) ×2
BENZOIN TINCTURE PRP APPL 2/3 (GAUZE/BANDAGES/DRESSINGS) ×2 IMPLANT
BLADE CLIPPER SURG (BLADE) IMPLANT
BUR CUTTER 7.0 ROUND (BURR) IMPLANT
BUR MATCHSTICK NEURO 3.0 LAGG (BURR) ×2 IMPLANT
CAGE EXP CATALYFT SHORT 9X22.5 (Cage) ×2 IMPLANT
CANISTER SUCT 3000ML PPV (MISCELLANEOUS) ×2 IMPLANT
CAP LCK SPNE (Orthopedic Implant) ×4 IMPLANT
CAP LOCK SPINE RADIUS (Orthopedic Implant) IMPLANT
CAP LOCKING (Orthopedic Implant) ×8 IMPLANT
CARTRIDGE OIL MAESTRO DRILL (MISCELLANEOUS) ×1 IMPLANT
CNTNR URN SCR LID CUP LEK RST (MISCELLANEOUS) ×1 IMPLANT
CONT SPEC 4OZ STRL OR WHT (MISCELLANEOUS) ×2
COVER BACK TABLE 60X90IN (DRAPES) ×2 IMPLANT
DECANTER SPIKE VIAL GLASS SM (MISCELLANEOUS) ×1 IMPLANT
DERMABOND ADVANCED (GAUZE/BANDAGES/DRESSINGS) ×1
DERMABOND ADVANCED .7 DNX12 (GAUZE/BANDAGES/DRESSINGS) ×1 IMPLANT
DIFFUSER DRILL AIR PNEUMATIC (MISCELLANEOUS) ×2 IMPLANT
DRAPE C-ARM 42X72 X-RAY (DRAPES) ×4 IMPLANT
DRAPE HALF SHEET 40X57 (DRAPES) IMPLANT
DRAPE LAPAROTOMY 100X72X124 (DRAPES) ×2 IMPLANT
DRAPE SURG 17X23 STRL (DRAPES) ×4 IMPLANT
DRSG OPSITE POSTOP 4X6 (GAUZE/BANDAGES/DRESSINGS) ×2 IMPLANT
DURAPREP 26ML APPLICATOR (WOUND CARE) ×2 IMPLANT
ELECT REM PT RETURN 9FT ADLT (ELECTROSURGICAL) ×2
ELECTRODE REM PT RTRN 9FT ADLT (ELECTROSURGICAL) ×1 IMPLANT
EVACUATOR 1/8 PVC DRAIN (DRAIN) IMPLANT
GAUZE 4X4 16PLY ~~LOC~~+RFID DBL (SPONGE) ×1 IMPLANT
GAUZE SPONGE 4X4 12PLY STRL (GAUZE/BANDAGES/DRESSINGS) IMPLANT
GLOVE EXAM NITRILE XL STR (GLOVE) IMPLANT
GLOVE SURG ENC MOIS LTX SZ6.5 (GLOVE) ×2 IMPLANT
GLOVE SURG LTX SZ7.5 (GLOVE) ×1 IMPLANT
GLOVE SURG LTX SZ9 (GLOVE) ×4 IMPLANT
GLOVE SURG UNDER POLY LF SZ6.5 (GLOVE) ×2 IMPLANT
GLOVE SURG UNDER POLY LF SZ7 (GLOVE) ×3 IMPLANT
GLOVE SURG UNDER POLY LF SZ7.5 (GLOVE) ×3 IMPLANT
GOWN STRL REUS W/ TWL LRG LVL3 (GOWN DISPOSABLE) IMPLANT
GOWN STRL REUS W/ TWL XL LVL3 (GOWN DISPOSABLE) ×2 IMPLANT
GOWN STRL REUS W/TWL 2XL LVL3 (GOWN DISPOSABLE) IMPLANT
GOWN STRL REUS W/TWL LRG LVL3 (GOWN DISPOSABLE) ×2
GOWN STRL REUS W/TWL XL LVL3 (GOWN DISPOSABLE) ×4
GRAFT BN 5X1XSPNE CVD POST DBM (Bone Implant) IMPLANT
GRAFT BONE MAGNIFUSE 1X5CM (Bone Implant) ×2 IMPLANT
KIT BASIN OR (CUSTOM PROCEDURE TRAY) ×2 IMPLANT
KIT TURNOVER KIT B (KITS) ×2 IMPLANT
MILL MEDIUM DISP (BLADE) ×2 IMPLANT
NEEDLE HYPO 22GX1.5 SAFETY (NEEDLE) ×2 IMPLANT
NS IRRIG 1000ML POUR BTL (IV SOLUTION) ×2 IMPLANT
OIL CARTRIDGE MAESTRO DRILL (MISCELLANEOUS) ×2
PACK LAMINECTOMY NEURO (CUSTOM PROCEDURE TRAY) ×2 IMPLANT
PUTTY DBF 6CC CORTICAL FIBERS (Putty) ×1 IMPLANT
ROD RADIUS 35MM (Rod) ×2 IMPLANT
SCREW 5.75X40M (Screw) ×2 IMPLANT
SCREW 5.75X45MM (Screw) ×2 IMPLANT
SPONGE SURGIFOAM ABS GEL 100 (HEMOSTASIS) ×2 IMPLANT
SPONGE T-LAP 4X18 ~~LOC~~+RFID (SPONGE) ×2 IMPLANT
STRIP CLOSURE SKIN 1/2X4 (GAUZE/BANDAGES/DRESSINGS) ×3 IMPLANT
SUT VIC AB 0 CT1 18XCR BRD8 (SUTURE) ×2 IMPLANT
SUT VIC AB 0 CT1 8-18 (SUTURE) ×4
SUT VIC AB 2-0 CT1 18 (SUTURE) ×2 IMPLANT
SUT VIC AB 3-0 SH 8-18 (SUTURE) ×4 IMPLANT
TOWEL GREEN STERILE (TOWEL DISPOSABLE) ×2 IMPLANT
TOWEL GREEN STERILE FF (TOWEL DISPOSABLE) ×2 IMPLANT
TRAY FOLEY MTR SLVR 16FR STAT (SET/KITS/TRAYS/PACK) ×2 IMPLANT
WATER STERILE IRR 1000ML POUR (IV SOLUTION) ×2 IMPLANT

## 2021-08-26 NOTE — H&P (Signed)
Susan Woodward is an 66 y.o. female.   Chief Complaint: Back pain HPI: 66 year old female with back and bilateral lower extremity symptoms failing conservative management her work-up demonstrates evidence of a chronic grade 2 degenerative spondylolisthesis with severe stenosis.  Patient also with severe multilevel disc generation disc base collapse and degenerative scoliosis.  As the patient seems to be most symptomatic from the L4-5 level we have decided to address this alone.    Past Medical History:  Diagnosis Date   Hypertension    Osteoporosis     Past Surgical History:  Procedure Laterality Date   WRIST SURGERY Right    12 years ago    History reviewed. No pertinent family history. Social History:  reports that she has never smoked. She has never used smokeless tobacco. She reports that she does not currently use alcohol. She reports that she does not currently use drugs.  Allergies: No Known Allergies  Medications Prior to Admission  Medication Sig Dispense Refill   acetaminophen (TYLENOL) 650 MG CR tablet Take 1,300 mg by mouth in the morning, at noon, and at bedtime.     B Complex-C (B-COMPLEX WITH VITAMIN C) tablet Take 1 tablet by mouth daily.     Calcium Carbonate-Vit D-Min (CALCIUM 600+D3 PLUS MINERALS PO) Take 1 tablet by mouth daily.     Magnesium 500 MG TABS Take 500 mg by mouth every evening.     meloxicam (MOBIC) 15 MG tablet TAKE 1 TABLET BY MOUTH WITH FOOD EVERY DAY AS NEEDED FOR PAIN 30 tablet 2   Omega-3 Fatty Acids (FISH OIL) 1000 MG CAPS Take 1,000 mg by mouth every evening.     omeprazole (PRILOSEC) 20 MG capsule Take 20 mg by mouth in the morning.     Potassium 99 MG TABS Take 99 mg by mouth 4 (four) times a week.     TURMERIC PO Take 1,000 mg by mouth in the morning.     valsartan (DIOVAN) 80 MG tablet Take 80 mg by mouth in the morning.     acetaminophen (TYLENOL) 325 MG tablet Take 650 mg by mouth every 6 (six) hours as needed. (Patient not taking:  Reported on 07/05/2021)      Results for orders placed or performed during the hospital encounter of 08/26/21 (from the past 48 hour(s))  ABO/Rh     Status: None (Preliminary result)   Collection Time: 08/26/21  6:50 AM  Result Value Ref Range   ABO/RH(D) PENDING    No results found.  Pertinent items noted in HPI and remainder of comprehensive ROS otherwise negative.  Blood pressure (!) 142/80, pulse 72, temperature 98.6 F (37 C), temperature source Oral, resp. rate 18, height 4\' 11"  (1.499 m), weight 51.7 kg, SpO2 100 %.  Patient is awake and alert.  She is oriented and appropriate.  Speech is fluent.  Judgment insight are intact.  Cranial nerve function normal bilateral.  Motor examination reveals some mild weakness of dorsiflexion on the right otherwise motor strength intact.  Sensory examination some decrease sensation to very light touch in her L5 and S1 dermatomes bilateral.  Deep intermix is normal active scepter Achilles reflexes are absent.  Gait is antalgic.  Posture is moderately flexed peer examination head ears eyes nose and throat is unremarked.  Chest and abdomen are benign. Assessment/Plan Grade 2 L4-5 degenerative spondylolisthesis with severe stenosis.  Plan bilateral L4-5 decompressive laminotomies and foraminotomies followed by posterior lumbar interbody fusion utilizing interbody cages, local harvested autograft, and augmented  with posterior arthrodesis utilizing nonsegmental pedicle screw fixation.  Risks and benefits been explained.  Patient wishes to proceed.  [name] Susan Woodward 08/26/2021, 7:52 AM

## 2021-08-26 NOTE — Transfer of Care (Signed)
Immediate Anesthesia Transfer of Care Note  Patient: Susan Woodward  Procedure(s) Performed: POSTERIOR LUMBAR INTERBODY FUSION  - LUMBAR FOUR-LUMBAR FIVE (Back)  Patient Location: PACU  Anesthesia Type:General  Level of Consciousness: drowsy  Airway & Oxygen Therapy: Patient Spontanous Breathing and Patient connected to nasal cannula oxygen  Post-op Assessment: Report given to RN and Post -op Vital signs reviewed and stable  Post vital signs: Reviewed and stable  Last Vitals:  Vitals Value Taken Time  BP 89/51 08/26/21 1043  Temp    Pulse 81 08/26/21 1045  Resp 12 08/26/21 1046  SpO2 100 % 08/26/21 1045  Vitals shown include unvalidated device data.  Last Pain:  Vitals:   08/26/21 0643  TempSrc:   PainSc: 0-No pain         Complications: No notable events documented.

## 2021-08-26 NOTE — Brief Op Note (Signed)
08/26/2021  10:31 AM  PATIENT:  Susan Woodward  66 y.o. female  PRE-OPERATIVE DIAGNOSIS:  Spondylolisthesis  POST-OPERATIVE DIAGNOSIS:  Spondylolisthesis  PROCEDURE:  Procedure(s): POSTERIOR LUMBAR INTERBODY FUSION  - LUMBAR FOUR-LUMBAR FIVE (N/A)  SURGEON:  Surgeon(s) and Role:    * Forney Kleinpeter, Sherilyn Cooter, MD - Primary    * Ostergard, Clovis Pu, MD - Assisting  PHYSICIAN ASSISTANT:   ASSISTANTS:    ANESTHESIA:   general  EBL:  250 mL   BLOOD ADMINISTERED:none  DRAINS: none   LOCAL MEDICATIONS USED:  MARCAINE     SPECIMEN:  No Specimen  DISPOSITION OF SPECIMEN:  N/A  COUNTS:  YES  TOURNIQUET:  * No tourniquets in log *  DICTATION: .Dragon Dictation  PLAN OF CARE: Admit for overnight observation  PATIENT DISPOSITION:  PACU - hemodynamically stable.   Delay start of Pharmacological VTE agent (>24hrs) due to surgical blood loss or risk of bleeding: yes

## 2021-08-26 NOTE — Op Note (Signed)
Date of procedure: 08/26/2021  Date of dictation: Same  Service: Neurosurgery  Preoperative diagnosis: Grade 2 degenerative spondylolisthesis at L4-5 with severe stenosis and sagittal plane imbalance  Postoperative diagnosis: Same  Procedure Name: Bilateral L4-5 decompressive laminotomies with bilateral L4 and L5 decompressive foraminotomies, more than would be required for simple interbody fusion alone.  L4-L5 ponte osteotomies for sagittal plane restoration  L4-5 posterior lumbar interbody fusion utilizing interbody cages, local autograft and morselized allograft  L4-5 posterior lateral arthrodesis utilizing nonsegmental pedicle screw fixation and local autograft Surgeon:Luree Palla A.Jaisen Wiltrout, M.D.  Asst. Surgeon: Venetia Constable, MD  Anesthesia: General  Indication: 66 year old female with severe chronic back pain with bilateral lower extremity symptoms cell left greater than right failing conservative management work-up demonstrates evidence of severe multilevel degeneration with disc space collapse and degenerative scoliosis.  Patient with a grade 2 unstable degenerative spondylolisthesis at L4-5 with marked loss of sagittal balance.  Patient presents now for decompression and fusion in hopes of improving her symptoms.  Operative note: After induction anesthesia, patient edition prone on the Wilson frame and appropriate padded.  Lumbar region prepped and draped sterilely.  Incision made overlying L4-5.  Dissection performed bilaterally.  Retractor placed.  Fluoroscopy used.  Levels confirmed.  The lamina of L5 was overriding the lamina of L4.  I decided to resect the spinous process of L4 to ease the performance of the decompression.  Bilateral decompressive laminotomies were then performed using Leksell rongeurs, care centers a high-speed drill.  The inferior two thirds of the lamina of L4 were removed bilaterally.  The entire inferior facet and pars interarticularis were removed bilaterally.  The  superior facet of L5 was also resected.  Working laterally ligamentum flavum was elevated and resected.  Ponte osteotomies were completed with complete resection of the facet joints and entry into the disc space at L4-5.  This allowed for mobilization of the L4-5 segment and reduction of her deformity.  Bilateral discectomies were then completed.  This patient then prepared for interbody fusion.  With a distractor placed patient's right side.  The space was then cleaned of all soft tissue.  A 9 mm Medtronic expandable cage was then impacted into place and expanded.  Distractor removed patient's right side.  Disc base was prepared on the right side.  Morselized autograft was then packed in the interspace.  A second cage was then impacted into place and expanded.  Pedicles of L4 and L5 were identified using surface landmarks and intraoperative fluoroscopy of superficial bone overlying the pedicle was then removed and high-speed drill.  Each pedicle was then probed using a pedicle all each pedicle tract was then probed and found to be solidly within the bone.  Each pedicle tract was then tapped with a screw tap.  Each screw track was probed and found to be solidly within the bone.  5.75 mm radius brand screws from [employer] were placed bilaterally at L4 and L5.  Final images reveal good position of cages and hardware at the proper upper level with normal alignment of spine.  Graft on DBF allograft was then packed into each cage bilaterally.  Short segment of titanium rods placed over the screw heads at L4 and L5.  Locking caps were then placed over the screw head.  Locking caps were then engaged bilaterally with construct under mild compression.  Transverse processes were decorticated.  Morselized autograft was packed posterior laterally as were allograft packets.  Gelfoam was placed over the laminectomy defect.  Vancomycin powder placed in  deep wound space.  Wounds then closed in layers with Vicryl sutures.   Steri-Strips and sterile dressing were applied.  No apparent complications.  Patient tolerated the procedure well and she returns to the recovery room postop.

## 2021-08-26 NOTE — Progress Notes (Signed)
Orthopedic Tech Progress Note Patient Details:  Susan Woodward 1956-06-07 [id number]  Per RN " patient has brace"   Patient ID: Susan Woodward, female   DOB: 1955/10/09, 66 y.o.   MRN: [id number]  Susan Woodward 08/26/2021, 2:34 PM

## 2021-08-26 NOTE — Anesthesia Procedure Notes (Signed)
Procedure Name: Intubation Date/Time: 08/26/2021 8:14 AM Performed by: Elliot Dally, CRNA Pre-anesthesia Checklist: Patient identified, Emergency Drugs available, Suction available and Patient being monitored Patient Re-evaluated:Patient Re-evaluated prior to induction Oxygen Delivery Method: Circle System Utilized Preoxygenation: Pre-oxygenation with 100% oxygen Induction Type: IV induction Ventilation: Mask ventilation without difficulty Laryngoscope Size: Miller and 2 Grade View: Grade I Tube type: Oral Tube size: 7.0 mm Number of attempts: 1 Airway Equipment and Method: Stylet and Oral airway Placement Confirmation: ETT inserted through vocal cords under direct vision, positive ETCO2 and breath sounds checked- equal and bilateral Secured at: 20 cm Tube secured with: Tape Dental Injury: Teeth and Oropharynx as per pre-operative assessment

## 2021-08-27 DIAGNOSIS — M48061 Spinal stenosis, lumbar region without neurogenic claudication: Secondary | ICD-10-CM | POA: Diagnosis not present

## 2021-08-27 DIAGNOSIS — I10 Essential (primary) hypertension: Secondary | ICD-10-CM | POA: Diagnosis not present

## 2021-08-27 DIAGNOSIS — M4316 Spondylolisthesis, lumbar region: Secondary | ICD-10-CM | POA: Diagnosis not present

## 2021-08-27 DIAGNOSIS — Z79899 Other long term (current) drug therapy: Secondary | ICD-10-CM | POA: Diagnosis not present

## 2021-08-27 MED ORDER — METHOCARBAMOL 500 MG PO TABS: 500.0000 mg | ORAL_TABLET | Freq: Four times a day (QID) | ORAL | 0 refills | Status: DC

## 2021-08-27 MED ORDER — HYDROXYZINE HCL 50 MG/ML IM SOLN
25.0000 mg | Freq: Four times a day (QID) | INTRAMUSCULAR | Status: DC | PRN
  Administered 2021-08-27: 25 mg via INTRAMUSCULAR
  Filled 2021-08-27: qty 1

## 2021-08-27 MED ORDER — OXYCODONE HCL 10 MG PO TABS: 10.0000 mg | ORAL_TABLET | ORAL | 0 refills | Status: DC | PRN

## 2021-08-27 MED FILL — Sodium Chloride IV Soln 0.9%: INTRAVENOUS | Qty: 1000 | Status: AC

## 2021-08-27 MED FILL — Heparin Sodium (Porcine) Inj 1000 Unit/ML: INTRAMUSCULAR | Qty: 30 | Status: AC

## 2021-08-27 NOTE — Plan of Care (Signed)
Pt doing well. Pt and daughter given D/C instructions with verbal understanding. Rx's were sent to the pharmacy by MD. Pt's incision is clean and dry with no sign of infection. Pt's IV was removed prior to D/C. Pt received 3-n-1 from [employer] prior to D/C. Pt D/C'd home via wheelchair per MD order. Pt is stable @ D/C and has no other needs at this time. Rema Fendt, RN

## 2021-08-27 NOTE — Discharge Instructions (Signed)

## 2021-08-27 NOTE — Evaluation (Signed)
Occupational Therapy Evaluation Patient Details Name: Susan Woodward MRN: [id number] DOB: [birth year] Today's Date: 08/27/2021   History of Present Illness Susan Woodward  66 y.o. female who is s/p Bilateral L4-5 decompressive laminotomies with bilateral L4 and L5 decompressive foraminotomies 1/30. PMHx: HTN, osteoporosis   Clinical Impression   Susan Woodward reports being indep prior to the above back surgery. She lives in a 1 level home with her husband, and her sisters plan to assist as needed at d/c. After review, pt verbalized great understanding of back precautions and compensatory techniques to maintain during ADLs. Overall she was set up A for upper ADLs and min guard for lower ADLs. She benefited from supervision- min G for functional mobility with verbal cues for safety. Recommend to sit to shower to increase safety with use of BSC in shower. Pt does not have further acute OT needs. Recommend d/c home with support of family, no OT follow up.      Recommendations for follow up therapy are one component of a multi-disciplinary discharge planning process, led by the attending physician.  Recommendations may be updated based on patient status, additional functional criteria and insurance authorization.   Follow Up Recommendations  No OT follow up    Assistance Recommended at Discharge Intermittent Supervision/Assistance  Patient can return home with the following A little help with walking and/or transfers;A little help with bathing/dressing/bathroom;Assist for transportation    Functional Status Assessment  Patient has had a recent decline in their functional status and demonstrates the ability to make significant improvements in function in a reasonable and predictable amount of time.  Equipment Recommendations  BSC/3in1    Recommendations for Other Services       Precautions / Restrictions Precautions Precautions: Fall;Back Precaution Booklet Issued: Yes (comment) Precaution Comments:  reviewed throughout session Required Braces or Orthoses: Spinal Brace Spinal Brace: Lumbar corset;Applied in sitting position Restrictions Weight Bearing Restrictions: No      Mobility Bed Mobility Overal bed mobility: Needs Assistance Bed Mobility: Rolling, Sidelying to Sit Rolling: Supervision Sidelying to sit: Supervision       General bed mobility comments: verbal cues for log roll    Transfers Overall transfer level: Needs assistance Equipment used: None Transfers: Sit to/from Stand Sit to Stand: Min guard           General transfer comment: verbal cues      Balance Overall balance assessment: Needs assistance Sitting-balance support: Feet supported Sitting balance-Leahy Scale: Good     Standing balance support: No upper extremity supported, During functional activity Standing balance-Leahy Scale: Fair                             ADL either performed or assessed with clinical judgement   ADL Overall ADL's : Needs assistance/impaired                                       General ADL Comments: Pt is set up for all upper body ADLs, and min guard for lower body ADLs with verbal cues for compensatory technique like figure four position. Reviewed compensaotry techniques throughout: two cups for oral hygiene, sit to shower, do not wash directly over incision, use reacher for LB dressing and obtaining out of reach objects.     Vision Baseline Vision/History: 0 No visual deficits Vision Assessment?: No apparent visual deficits  Perception     Praxis      Pertinent Vitals/Pain Pain Assessment Pain Assessment: Faces Faces Pain Scale: Hurts little more Pain Location: back Pain Descriptors / Indicators: Discomfort, Grimacing Pain Intervention(s): Limited activity within patient's tolerance, Monitored during session     Hand Dominance Right   Extremity/Trunk Assessment Upper Extremity Assessment Upper Extremity Assessment:  Overall WFL for tasks assessed   Lower Extremity Assessment Lower Extremity Assessment: Defer to PT evaluation   Cervical / Trunk Assessment Cervical / Trunk Assessment: Back Surgery   Communication Communication Communication: No difficulties   Cognition Arousal/Alertness: Awake/alert Behavior During Therapy: WFL for tasks assessed/performed Overall Cognitive Status: Within Functional Limits for tasks assessed                                 General Comments: great recall of precautions after review     General Comments  VSS on RA, no family present    Exercises     Shoulder Instructions      Home Living Family/patient expects to be discharged to:: Private residence Living Arrangements: Spouse/significant other Available Help at Discharge: Family;Available 24 hours/day Type of Home: House Home Access: Stairs to enter [employer] of Steps: 3 Entrance Stairs-Rails: None Home Layout: One level     Bathroom Shower/Tub: Chief Strategy Officer: Handicapped height     Home Equipment: Agricultural consultant (2 wheels);Other (comment) Lexicographer)   Additional Comments: sisters planning to come to assist      Prior Functioning/Environment Prior Level of Function : Independent/Modified Independent;Driving             Mobility Comments: no AD ADLs Comments: indep        OT Problem List: Decreased range of motion;Decreased activity tolerance;Impaired balance (sitting and/or standing);Decreased safety awareness;Decreased knowledge of precautions;Pain      OT Treatment/Interventions:      OT Goals(Current goals can be found in the care plan section) Acute Rehab OT Goals Patient Stated Goal: home OT Goal Formulation: All assessment and education complete, DC therapy Time For Goal Achievement: 08/27/21 Potential to Achieve Goals: Good  OT Frequency:      Co-evaluation              AM-PAC OT "6 Clicks" Daily Activity      Outcome Measure Help from another person eating meals?: None Help from another person taking care of personal grooming?: A Little Help from another person toileting, which includes using toliet, bedpan, or urinal?: A Little Help from another person bathing (including washing, rinsing, drying)?: A Little Help from another person to put on and taking off regular upper body clothing?: A Little Help from another person to put on and taking off regular lower body clothing?: A Little 6 Click Score: 19   End of Session Equipment Utilized During Treatment: Back brace Nurse Communication: Mobility status  Activity Tolerance: Patient tolerated treatment well Patient left: in chair;with call bell/phone within reach  OT Visit Diagnosis: Unsteadiness on feet (R26.81);Muscle weakness (generalized) (M62.81);Pain                Time: 0750-0809 OT Time Calculation (min): 19 min Charges:  OT General Charges $OT Visit: 1 Visit OT Evaluation $OT Eval Low Complexity: 1 Low  Logyn Dedominicis A Jakyla Reza 08/27/2021, 9:15 AM

## 2021-08-27 NOTE — Discharge Summary (Signed)
Physician Discharge Summary  Patient ID: Susan Woodward MRN: [id number] DOB/AGE: 01/11/1956 66 y.o.  Admit date: 08/26/2021 Discharge date: 08/27/2021  Admission Diagnoses:  Discharge Diagnoses:  Principal Problem:   Degenerative spondylolisthesis   Discharged Condition: good  Hospital Course: Patient admitted to the hospital where she underwent uncomplicated L4-5 decompression and fusion.  Postoperatively doing well.  Preoperative back and lower extremity pain improved.  Standing ambulating and voiding without difficulty.  Ready for discharge home.  Consults:   Significant Diagnostic Studies:   Treatments:   Discharge Exam: Blood pressure 109/67, pulse 85, temperature 98.2 F (36.8 C), temperature source Oral, resp. rate 16, height 4\' 11"  (1.499 m), weight 51.7 kg, SpO2 98 %. Awake and alert.  Oriented and appropriate.  Motor and sensory function intact.  Wound clean and dry.  Chest and abdomen benign.  Disposition: Discharge disposition: 01-Home or Self Care        Allergies as of 08/27/2021   No Known Allergies      Medication List     TAKE these medications    acetaminophen 650 MG CR tablet Commonly known as: TYLENOL Take 1,300 mg by mouth in the morning, at noon, and at bedtime.   acetaminophen 325 MG tablet Commonly known as: TYLENOL Take 650 mg by mouth every 6 (six) hours as needed.   B-complex with vitamin C tablet Take 1 tablet by mouth daily.   CALCIUM 600+D3 PLUS MINERALS PO Take 1 tablet by mouth daily.   Fish Oil 1000 MG Caps Take 1,000 mg by mouth every evening.   Magnesium 500 MG Tabs Take 500 mg by mouth every evening.   meloxicam 15 MG tablet Commonly known as: MOBIC TAKE 1 TABLET BY MOUTH WITH FOOD EVERY DAY AS NEEDED FOR PAIN   methocarbamol 500 MG tablet Commonly known as: Robaxin Take 1 tablet (500 mg total) by mouth 4 (four) times daily.   omeprazole 20 MG capsule Commonly known as: PRILOSEC Take 20 mg by mouth in the  morning.   Oxycodone HCl 10 MG Tabs Take 1 tablet (10 mg total) by mouth every 3 (three) hours as needed for severe pain ((score 7 to 10)).   Potassium 99 MG Tabs Take 99 mg by mouth 4 (four) times a week.   TURMERIC PO Take 1,000 mg by mouth in the morning.   valsartan 80 MG tablet Commonly known as: DIOVAN Take 80 mg by mouth in the morning.               Durable Medical Equipment  (From admission, onward)           Start     Ordered   08/26/21 1229  DME Walker rolling  Once       Question:  Patient needs a walker to treat with the following condition  Answer:  Degenerative spondylolisthesis   08/26/21 1228   08/26/21 1229  DME 3 n 1  Once        08/26/21 1228             Signed: 1229 A Leonardo Makris 08/27/2021, 8:14 AM

## 2021-08-27 NOTE — Evaluation (Addendum)
Physical Therapy Evaluation Patient Details Name: Susan Woodward MRN: [id number] DOB: April 07, 1956 Today's Date: 08/27/2021  History of Present Illness  Pt is a 66 y.o. female who is s/p Bilateral L4-5 decompressive laminotomies with bilateral L4 and L5 decompressive foraminotomies 08/26/21 PMH significant for HTN, osteoporosis   Clinical Impression  Pt admitted with above diagnosis. At the time of PT eval, pt was able to demonstrate transfers and ambulation with gross supervision for safety and no AD. Pt was educated on precautions, brace application/wearing schedule, appropriate activity progression, and car transfer. Pt currently with functional limitations due to the deficits listed below (see PT Problem List). Pt will benefit from skilled PT to increase their independence and safety with mobility to allow discharge to the venue listed below.         Recommendations for follow up therapy are one component of a multi-disciplinary discharge planning process, led by the attending physician.  Recommendations may be updated based on patient status, additional functional criteria and insurance authorization.  Follow Up Recommendations No PT follow up    Assistance Recommended at Discharge Set up Supervision/Assistance  Patient can return home with the following  A little help with walking and/or transfers;Assist for transportation;Help with stairs or ramp for entrance    Equipment Recommendations None recommended by PT  Recommendations for Other Services       Functional Status Assessment Patient has had a recent decline in their functional status and demonstrates the ability to make significant improvements in function in a reasonable and predictable amount of time.     Precautions / Restrictions Precautions Precautions: Fall;Back Precaution Booklet Issued: Yes (comment) Precaution Comments: reviewed throughout session Required Braces or Orthoses: Spinal Brace Spinal Brace: Lumbar  corset;Applied in sitting position Restrictions Weight Bearing Restrictions: No      Mobility  Bed Mobility Overal bed mobility: Modified Independent Bed Mobility: Rolling, Sidelying to Sit           General bed mobility comments: HOB flat and rails lowered to simulate home environment.    Transfers Overall transfer level: Needs assistance Equipment used: None Transfers: Sit to/from Stand Sit to Stand: Supervision           General transfer comment: Pt demonstrated proper hand placement on seated surface for safety. No unsteadiness or LOB noted.    Ambulation/Gait Ambulation/Gait assistance: Supervision Gait Distance (Feet): 250 Feet Assistive device: None Gait Pattern/deviations: Step-through pattern, Decreased stride length, Trunk flexed Gait velocity: Decreased Gait velocity interpretation: <1.8 ft/sec, indicate of risk for recurrent falls   General Gait Details: VC's for improved posture. Pt ambulating well without unsteadiness or LOB.  Stairs Stairs: Yes Stairs assistance: Min assist Stair Management: No rails, Step to pattern, Forwards Number of Stairs: 3 General stair comments: Daughter present for education. Pt's daughter instructed in safe HHA for pt and therapist guarded both pt and daughter as they negotiated 3 stairs without rails.  Wheelchair Mobility    Modified Rankin (Stroke Patients Only)       Balance Overall balance assessment: Needs assistance Sitting-balance support: Feet supported Sitting balance-Leahy Scale: Good     Standing balance support: No upper extremity supported, During functional activity Standing balance-Leahy Scale: Fair                               Pertinent Vitals/Pain Pain Assessment Pain Assessment: Faces Faces Pain Scale: Hurts little more Pain Location: back Pain Descriptors / Indicators: Discomfort, Grimacing  Home Living Family/patient expects to be discharged to:: Private  residence Living Arrangements: Spouse/significant other Available Help at Discharge: Family;Available 24 hours/day Type of Home: House Home Access: Stairs to enter Entrance Stairs-Rails: None Entrance Stairs-Number of Steps: 3   Home Layout: One level Home Equipment: Agricultural consultant (2 wheels);Other (comment);Adaptive equipment Additional Comments: sisters planning to come to assist    Prior Function Prior Level of Function : Independent/Modified Independent;Driving             Mobility Comments: no AD ADLs Comments: indep     Hand Dominance   Dominant Hand: Right    Extremity/Trunk Assessment   Upper Extremity Assessment Upper Extremity Assessment: Defer to OT evaluation    Lower Extremity Assessment Lower Extremity Assessment: Generalized weakness (Mild; consistent with pre-op diagnosis)    Cervical / Trunk Assessment Cervical / Trunk Assessment: Back Surgery  Communication   Communication: No difficulties  Cognition Arousal/Alertness: Awake/alert Behavior During Therapy: WFL for tasks assessed/performed Overall Cognitive Status: Within Functional Limits for tasks assessed                                          General Comments      Exercises     Assessment/Plan    PT Assessment Patient needs continued PT services  PT Problem List Decreased strength;Decreased activity tolerance;Decreased balance;Decreased mobility;Decreased knowledge of use of DME;Decreased safety awareness;Decreased knowledge of precautions;Pain       PT Treatment Interventions DME instruction;Gait training;Stair training;Functional mobility training;Therapeutic activities;Therapeutic exercise;Neuromuscular re-education;Patient/family education    PT Goals (Current goals can be found in the Care Plan section)  Acute Rehab PT Goals Patient Stated Goal: Home today PT Goal Formulation: With patient/family Time For Goal Achievement: 09/03/21 Potential to Achieve  Goals: Good    Frequency Min 5X/week     Co-evaluation               AM-PAC PT "6 Clicks" Mobility  Outcome Measure Help needed turning from your back to your side while in a flat bed without using bedrails?: None Help needed moving from lying on your back to sitting on the side of a flat bed without using bedrails?: None Help needed moving to and from a bed to a chair (including a wheelchair)?: A Little Help needed standing up from a chair using your arms (e.g., wheelchair or bedside chair)?: A Little Help needed to walk in hospital room?: A Little Help needed climbing 3-5 steps with a railing? : A Little 6 Click Score: 20    End of Session Equipment Utilized During Treatment: Gait belt Activity Tolerance: Patient tolerated treatment well Patient left: with family/visitor present;with nursing/sitter in room (Pt sitting on EOB) Nurse Communication: Mobility status PT Visit Diagnosis: Unsteadiness on feet (R26.81);Pain Pain - part of body:  (back)    Time: 0865-7846 PT Time Calculation (min) (ACUTE ONLY): 16 min   Charges:   PT Evaluation $PT Eval Low Complexity: 1 Low          Conni Slipper, PT, DPT Acute Rehabilitation Services Pager: [phone] Office: [phone]   Marylynn Pearson 08/27/2021, 1:03 PM

## 2021-08-29 NOTE — Anesthesia Postprocedure Evaluation (Signed)
Anesthesia Post Note  Patient: Susan Woodward  Procedure(s) Performed: POSTERIOR LUMBAR INTERBODY FUSION  - LUMBAR FOUR-LUMBAR FIVE (Back)     Patient location during evaluation: PACU Anesthesia Type: General Level of consciousness: awake and alert Pain management: pain level controlled Vital Signs Assessment: post-procedure vital signs reviewed and stable Respiratory status: spontaneous breathing, nonlabored ventilation, respiratory function stable and patient connected to nasal cannula oxygen Cardiovascular status: blood pressure returned to baseline and stable Postop Assessment: no apparent nausea or vomiting Anesthetic complications: no   No notable events documented.  Last Vitals:  Vitals:   08/27/21 0421 08/27/21 0823  BP: 109/67 103/60  Pulse: 85 78  Resp: 16 16  Temp: 36.8 C 36.9 C  SpO2: 98% 100%    Last Pain:  Vitals:   08/27/21 0940  TempSrc:   PainSc: 6                  Lurdes Haltiwanger

## 2021-09-25 DIAGNOSIS — M4310 Spondylolisthesis, site unspecified: Secondary | ICD-10-CM | POA: Diagnosis not present

## 2021-10-08 DIAGNOSIS — Z1231 Encounter for screening mammogram for malignant neoplasm of breast: Secondary | ICD-10-CM | POA: Diagnosis not present

## 2021-10-16 DIAGNOSIS — M810 Age-related osteoporosis without current pathological fracture: Secondary | ICD-10-CM | POA: Diagnosis not present

## 2021-10-16 DIAGNOSIS — I10 Essential (primary) hypertension: Secondary | ICD-10-CM | POA: Diagnosis not present

## 2021-10-30 DIAGNOSIS — M4310 Spondylolisthesis, site unspecified: Secondary | ICD-10-CM | POA: Diagnosis not present

## 2021-10-31 DIAGNOSIS — L570 Actinic keratosis: Secondary | ICD-10-CM | POA: Diagnosis not present

## 2021-10-31 DIAGNOSIS — L814 Other melanin hyperpigmentation: Secondary | ICD-10-CM | POA: Diagnosis not present

## 2021-10-31 DIAGNOSIS — D229 Melanocytic nevi, unspecified: Secondary | ICD-10-CM | POA: Diagnosis not present

## 2021-10-31 DIAGNOSIS — L821 Other seborrheic keratosis: Secondary | ICD-10-CM | POA: Diagnosis not present

## 2021-10-31 DIAGNOSIS — D1801 Hemangioma of skin and subcutaneous tissue: Secondary | ICD-10-CM | POA: Diagnosis not present

## 2021-11-04 ENCOUNTER — Ambulatory Visit (HOSPITAL_COMMUNITY): Payer: BC Managed Care – PPO

## 2021-11-07 ENCOUNTER — Other Ambulatory Visit (HOSPITAL_COMMUNITY): Payer: Self-pay | Admitting: *Deleted

## 2021-11-08 ENCOUNTER — Ambulatory Visit (HOSPITAL_COMMUNITY)
Admission: RE | Admit: 2021-11-08 | Discharge: 2021-11-08 | Disposition: A | Discharge status: Home / Self Care | Payer: BC Managed Care – PPO | Source: Ambulatory Visit | Attending: Family Medicine | Admitting: Family Medicine

## 2021-11-08 DIAGNOSIS — M810 Age-related osteoporosis without current pathological fracture: Secondary | ICD-10-CM | POA: Diagnosis not present

## 2021-11-08 MED ORDER — ZOLEDRONIC ACID 5 MG/100ML IV SOLN: 5.0000 mg | Freq: Once | INTRAVENOUS | Status: AC

## 2021-11-08 MED ORDER — ZOLEDRONIC ACID 5 MG/100ML IV SOLN
INTRAVENOUS | Status: AC
  Administered 2021-11-08: 5 mg via INTRAVENOUS
  Filled 2021-11-08: qty 100

## 2021-11-27 DIAGNOSIS — M4310 Spondylolisthesis, site unspecified: Secondary | ICD-10-CM | POA: Diagnosis not present

## 2021-11-27 DIAGNOSIS — Z6825 Body mass index (BMI) 25.0-25.9, adult: Secondary | ICD-10-CM | POA: Diagnosis not present

## 2022-03-07 DIAGNOSIS — M810 Age-related osteoporosis without current pathological fracture: Secondary | ICD-10-CM | POA: Diagnosis not present

## 2022-03-07 DIAGNOSIS — I10 Essential (primary) hypertension: Secondary | ICD-10-CM | POA: Diagnosis not present

## 2022-03-07 DIAGNOSIS — Z0000 Encounter for general adult medical examination without abnormal findings: Secondary | ICD-10-CM | POA: Diagnosis not present

## 2022-03-07 DIAGNOSIS — E7800 Pure hypercholesterolemia, unspecified: Secondary | ICD-10-CM | POA: Diagnosis not present

## 2022-04-02 DIAGNOSIS — R197 Diarrhea, unspecified: Secondary | ICD-10-CM | POA: Diagnosis not present

## 2022-04-03 DIAGNOSIS — R197 Diarrhea, unspecified: Secondary | ICD-10-CM | POA: Diagnosis not present

## 2022-04-17 DIAGNOSIS — K219 Gastro-esophageal reflux disease without esophagitis: Secondary | ICD-10-CM | POA: Diagnosis not present

## 2022-04-17 DIAGNOSIS — Z1211 Encounter for screening for malignant neoplasm of colon: Secondary | ICD-10-CM | POA: Diagnosis not present

## 2022-04-17 DIAGNOSIS — R197 Diarrhea, unspecified: Secondary | ICD-10-CM | POA: Diagnosis not present

## 2022-04-18 DIAGNOSIS — R197 Diarrhea, unspecified: Secondary | ICD-10-CM | POA: Diagnosis not present

## 2022-04-21 DIAGNOSIS — R110 Nausea: Secondary | ICD-10-CM | POA: Diagnosis not present

## 2022-04-21 DIAGNOSIS — R197 Diarrhea, unspecified: Secondary | ICD-10-CM | POA: Diagnosis not present

## 2022-05-13 DIAGNOSIS — R197 Diarrhea, unspecified: Secondary | ICD-10-CM | POA: Diagnosis not present

## 2022-05-13 DIAGNOSIS — K2930 Chronic superficial gastritis without bleeding: Secondary | ICD-10-CM | POA: Diagnosis not present

## 2022-05-13 DIAGNOSIS — K2970 Gastritis, unspecified, without bleeding: Secondary | ICD-10-CM | POA: Diagnosis not present

## 2022-05-13 DIAGNOSIS — K219 Gastro-esophageal reflux disease without esophagitis: Secondary | ICD-10-CM | POA: Diagnosis not present

## 2022-05-13 DIAGNOSIS — K2289 Other specified disease of esophagus: Secondary | ICD-10-CM | POA: Diagnosis not present

## 2022-05-13 DIAGNOSIS — K649 Unspecified hemorrhoids: Secondary | ICD-10-CM | POA: Diagnosis not present

## 2022-05-13 DIAGNOSIS — K52831 Collagenous colitis: Secondary | ICD-10-CM | POA: Diagnosis not present

## 2022-05-13 DIAGNOSIS — K5730 Diverticulosis of large intestine without perforation or abscess without bleeding: Secondary | ICD-10-CM | POA: Diagnosis not present

## 2022-05-13 DIAGNOSIS — R194 Change in bowel habit: Secondary | ICD-10-CM | POA: Diagnosis not present

## 2022-05-13 DIAGNOSIS — D127 Benign neoplasm of rectosigmoid junction: Secondary | ICD-10-CM | POA: Diagnosis not present

## 2022-05-15 DIAGNOSIS — K2930 Chronic superficial gastritis without bleeding: Secondary | ICD-10-CM | POA: Diagnosis not present

## 2022-05-15 DIAGNOSIS — D127 Benign neoplasm of rectosigmoid junction: Secondary | ICD-10-CM | POA: Diagnosis not present

## 2022-05-15 DIAGNOSIS — K52831 Collagenous colitis: Secondary | ICD-10-CM | POA: Diagnosis not present

## 2022-06-17 DIAGNOSIS — K52831 Collagenous colitis: Secondary | ICD-10-CM | POA: Diagnosis not present

## 2022-06-17 DIAGNOSIS — K219 Gastro-esophageal reflux disease without esophagitis: Secondary | ICD-10-CM | POA: Diagnosis not present

## 2022-09-04 DIAGNOSIS — M48062 Spinal stenosis, lumbar region with neurogenic claudication: Secondary | ICD-10-CM | POA: Diagnosis not present

## 2022-09-04 DIAGNOSIS — M4310 Spondylolisthesis, site unspecified: Secondary | ICD-10-CM | POA: Diagnosis not present

## 2022-09-09 ENCOUNTER — Encounter: Payer: Self-pay | Admitting: Rehabilitative and Restorative Service Providers"

## 2022-09-09 ENCOUNTER — Ambulatory Visit: Payer: Medicare Other | Attending: Student | Admitting: Rehabilitative and Restorative Service Providers"

## 2022-09-09 ENCOUNTER — Other Ambulatory Visit: Payer: Self-pay

## 2022-09-09 ENCOUNTER — Ambulatory Visit: Payer: BC Managed Care – PPO

## 2022-09-09 DIAGNOSIS — M6281 Muscle weakness (generalized): Secondary | ICD-10-CM | POA: Diagnosis not present

## 2022-09-09 DIAGNOSIS — R252 Cramp and spasm: Secondary | ICD-10-CM | POA: Insufficient documentation

## 2022-09-09 DIAGNOSIS — R262 Difficulty in walking, not elsewhere classified: Secondary | ICD-10-CM | POA: Diagnosis not present

## 2022-09-09 DIAGNOSIS — M5459 Other low back pain: Secondary | ICD-10-CM | POA: Insufficient documentation

## 2022-09-09 DIAGNOSIS — R2689 Other abnormalities of gait and mobility: Secondary | ICD-10-CM | POA: Insufficient documentation

## 2022-09-09 NOTE — Therapy (Signed)
OUTPATIENT PHYSICAL THERAPY LOWER EXTREMITY EVALUATION   Patient Name: Susan Woodward MRN: [id number] DOB:[birth year], 67 y.o., female Today's Date: 09/09/2022  END OF SESSION:  PT End of Session - 09/09/22 1410     Visit Number 1    Date for PT Re-Evaluation 10/31/22    Authorization Type BC/BS Medicare    Progress Note Due on Visit 10    PT Start Time 1400    PT Stop Time 1440    PT Time Calculation (min) 40 min    Activity Tolerance Patient tolerated treatment well    Behavior During Therapy [facility] for tasks assessed/performed             Past Medical History:  Diagnosis Date   Hypertension    Osteoporosis    Past Surgical History:  Procedure Laterality Date   WRIST SURGERY Right    12 years ago   Patient Active Problem List   Diagnosis Date Noted   Degenerative spondylolisthesis 08/26/2021   Spinal stenosis of lumbar region 07/13/2017   Trochanteric bursitis, right hip 10/01/2016    PCP: Soundra Pilon, FNP  REFERRING PROVIDER: Floreen Comber, NP  REFERRING DIAG: [id number] (ICD-10-CM) - Spinal stenosis, lumbar region with neurogenic claudication  THERAPY DIAG:  Cramp and spasm - Plan: PT plan of care cert/re-cert  Difficulty in walking, not elsewhere classified - Plan: PT plan of care cert/re-cert  Other abnormalities of gait and mobility - Plan: PT plan of care cert/re-cert  Muscle weakness (generalized) - Plan: PT plan of care cert/re-cert  Other low back pain - Plan: PT plan of care cert/re-cert  Rationale for Evaluation and Treatment: Rehabilitation  ONSET DATE: Early December 2023  SUBJECTIVE:   SUBJECTIVE STATEMENT: Pt reports that she underwent back surgery on 08/26/2021 and was doing well and without pain.  Approximately 3 months ago, she started feeling like she had a rubber band around her left ankle and numbness and tingling started approx 2 months down left leg.  Patient reports having left hip pain, as well.  PERTINENT HISTORY: lumbar  spondylolisthesis s/p L4-5 decompression and fusion on 08/26/2021, osteoporosis, HTN  PAIN:  Are you having pain? Yes: NPRS scale: 8/10 Pain location: left hip and down left leg (occasionally low back) Pain description: constant, "like a tooth ache" Aggravating factors: standing and walking Relieving factors: sitting  PRECAUTIONS: None  WEIGHT BEARING RESTRICTIONS: No  FALLS:  Has patient fallen in last 6 months? No  LIVING ENVIRONMENT: Lives with: lives with their spouse Lives in: House/apartment Stairs: Yes: External: 3 steps; none Has following equipment at home: None  OCCUPATION: retired  PLOF: Independent and Leisure: enjoys outdoor activities, walking  PATIENT GOALS: To be able to return to her 2 mile walking program  NEXT MD VISIT: Mid-March  OBJECTIVE:   DIAGNOSTIC FINDINGS:  Pt reports that radiographs were performed for her back and it revealed that hardware was intact.  Pt states that she has not had any imaging of her left hip yet.  PATIENT SURVEYS:  FOTO 46% (projected 61% by visit 12)  COGNITION: Overall cognitive status: Within functional limits for tasks assessed     SENSATION: Pt reports numbness and tingling down left leg   MUSCLE LENGTH: 09/09/2022: Hamstrings: Right  82 deg; Left 75 deg (with pain on left side)  POSTURE: No Significant postural limitations  PALPATION: Tenderness to palpation along left hip  LOWER EXTREMITY ROM:  WFL  LOWER EXTREMITY MMT:  Eval: Right LE strength is WFL Left hip  strength is 4/5 and left knee is WFL  LOWER EXTREMITY SPECIAL TESTS:  Eval: Hip special tests: Luisa Hart (FABER) test: positive , SI compression test: positive , SI distraction test: positive , and Hip scouring test: positive  Slump test slight positive on left, negative right  FUNCTIONAL TESTS:  09/09/2022: 5 times sit to stand: 10.6 secs with pain of 8/10 Single leg stance:  Right- 30 sec, Left- 24 sec. Noted to nearly lose balance at  multiple occasions during SLS on left LE  GAIT: Distance walked: >500 ft Assistive device utilized: None Level of assistance: Complete Independence Comments: Pt reports ability to walk for at least 10 min before she needs to stop.  Pt reports that pain starts immediately upon starting to walk.   TODAY'S TREATMENT:                                                                                                                              DATE: 09/09/2022  Reviewed HEP Educated about aquatics PT and provided handout   PATIENT EDUCATION:  Education details: Issued HEP Person educated: Patient Education method: Programmer, multimedia, Facilities manager, and Handouts Education comprehension: verbalized understanding and returned demonstration  HOME EXERCISE PROGRAM: Access Code: [email or web address] URL: [email or web address] Date: 09/09/2022 Prepared by: Reather Laurence  Exercises - Supine Posterior Pelvic Tilt  - 1 x daily - 7 x weekly - 2 sets - 10 reps - Supine Lower Trunk Rotation  - 1 x daily - 7 x weekly - 1 sets - 10 reps - Supine Piriformis Stretch with Foot on Ground  - 1 x daily - 7 x weekly - 1 sets - 2 reps - 20 sec hold  ASSESSMENT:  CLINICAL IMPRESSION: Patient is a 67 y.o. female who was seen today for physical therapy evaluation and treatment for lumbar spinal stenosis.  Patient reports that she had a lumbar fusion and decompression on 08/26/2021, but this pain feels differently than it did prior to her back surgery.  Patient reports that she can ride a bike for at least 5 miles, but she immediately has pain with ambulation.  Patient presents with positive hip special testing on right side.  Patient states that she has not had any imaging on her left hip, only her back at this time, but she was wondering if she should follow up with the orthopedic group that she did prior to her lumbar surgery.  Advised pt that she may want to reach out to her MD about assessment of her left hip to rule  out any involvement that may have been masked due to recent back surgery.  Patient presents with increased pain, difficulty with walking, muscle weakness, and decreased balance on left single leg stance.  Patient would benefit from skilled PT to address her functional impairments and allow her to resume her walking program and activities outside without increased pain.  OBJECTIVE IMPAIRMENTS: decreased balance, difficulty walking, decreased strength, increased muscle spasms, and pain.   ACTIVITY  LIMITATIONS: standing, squatting, and stairs  PARTICIPATION LIMITATIONS: cleaning, community activity, and yard work  PERSONAL FACTORS: Past/current experiences, Time since onset of injury/illness/exacerbation, and 3+ comorbidities: L4-5 decompression and fusion on 08/26/21, osteoporosis, hypertension  are also affecting patient's functional outcome.   REHAB POTENTIAL: Good  CLINICAL DECISION MAKING: Evolving/moderate complexity  EVALUATION COMPLEXITY: Moderate   GOALS: Goals reviewed with patient? Yes  SHORT TERM GOALS: Target date: 09/30/2022 Patient will be independent with initial HEP. Baseline: Goal status: INITIAL  2.  Patient will report at least a 30% improvement in pain and functional tasks since initial evaluation. Baseline:  Goal status: INITIAL   LONG TERM GOALS: Target date: 10/31/2022  Patient will be independent with advanced HEP and self progression in preparation for discharge. Baseline:  Goal status: INITIAL  2.  Patient will increase FOTO score to at least 61% to demonstrate improvements in functional mobilty. Baseline: 46% Goal status: INITIAL  3.  Patient will increase left hip strength to at least 5-/5 to allow for easier ability to negotiate stairs with reciprocal pattern. Baseline:  Goal status: INITIAL  4.  Patient will be able to resume daily ambulation program with pain no greater than 3/10. Baseline:  Goal status: INITIAL  5.  Patient will be able to  maintain left single leg stance for at least 30 seconds with good stability to decrease risk of falling. Baseline: 24 sec Goal status: INITIAL    PLAN:  PT FREQUENCY: 2x/week  PT DURATION: 8 weeks  PLANNED INTERVENTIONS: Therapeutic exercises, Therapeutic activity, Neuromuscular re-education, Balance training, Gait training, Patient/Family education, Self Care, Joint mobilization, Joint manipulation, Stair training, Aquatic Therapy, Dry Needling, Electrical stimulation, Spinal manipulation, Spinal mobilization, Cryotherapy, Moist heat, Taping, Traction, Ultrasound, Ionotophoresis 4mg /ml Dexamethasone, Manual therapy, and Re-evaluation  PLAN FOR NEXT SESSION: assess and progress HEP as indicated, aquatic PT, strengthening/core stability, balance   Reather Laurence, PT 09/09/2022, 3:59 PM   [facility] [address], [address] [place], [place] [address] Phone # [phone] Fax [phone]

## 2022-09-09 NOTE — Patient Instructions (Signed)
     [facility] Welcome to [facility]! Here you will find all the information you will need regarding your pool therapy. If you have further questions at any time, please call our office at [phone]. After completing your initial evaluation in the [facility], you may be eligible to complete a portion of your therapy in the pool. A typical week of therapy will consist of 1-2 typical physical therapy visits at our [facility] location and an additional session of therapy in the pool located at the [facility] at [address]. [address], [place] [address]. The phone number at the pool site is [phone]. Please call this number if you are running late or need to cancel your appointment.  Aquatic therapy will be offered on Wednesday mornings and Friday afternoons. Each session will last approximately 45 minutes. All scheduling and payments for aquatic therapy sessions, including cancelations, will be done through our [facility] location.  To be eligible for aquatic therapy, these criteria must be met: You must be able to independently change in the locker room and get to the pool deck. A caregiver can come with you to help if needed. There are benches for a caregiver to sit on next to the pool. No one with an open wound is permitted in the pool.  Handicap parking is available in the front and there is a drop off option for even closer accessibility. Please arrive 15 minutes prior to your appointment to prepare for your pool session. You must sign in at the front desk upon your arrival. Please be sure to attend to any toileting needs prior to entering the pool. Locker rooms for changing are available.  There is direct access to the pool deck from the locker room. You can lock your belongings in a locker or bring them with you poolside. Your therapist will greet you on the pool deck. There may be other swimmers in the pool at the  same time but your session is one-on-one with the therapist.   

## 2022-09-15 NOTE — Therapy (Unsigned)
OUTPATIENT PHYSICAL THERAPY LOWER EXTREMITY TREATMENT    Patient Name: Susan Woodward MRN: [id number] DOB:1956-01-14, 67 y.o., female Today's Date: 09/16/2022  END OF SESSION:  PT End of Session - 09/16/22 1023     Visit Number 2    Date for PT Re-Evaluation 10/31/22    Authorization Type BC/BS Medicare    Progress Note Due on Visit 10    PT Start Time 0932    PT Stop Time 1013    PT Time Calculation (min) 41 min    Activity Tolerance Patient tolerated treatment well    Behavior During Therapy [facility] for tasks assessed/performed              Past Medical History:  Diagnosis Date   Hypertension    Osteoporosis    Past Surgical History:  Procedure Laterality Date   WRIST SURGERY Right    12 years ago   Patient Active Problem List   Diagnosis Date Noted   Degenerative spondylolisthesis 08/26/2021   Spinal stenosis of lumbar region 07/13/2017   Trochanteric bursitis, right hip 10/01/2016    PCP: Soundra Pilon, FNP  REFERRING PROVIDER: Floreen Comber, NP  REFERRING DIAG: [id number] (ICD-10-CM) - Spinal stenosis, lumbar region with neurogenic claudication  THERAPY DIAG:  Cramp and spasm  Difficulty in walking, not elsewhere classified  Other abnormalities of gait and mobility  Other low back pain  Muscle weakness (generalized)  Rationale for Evaluation and Treatment: Rehabilitation  ONSET DATE: Early December 2023  SUBJECTIVE:   SUBJECTIVE STATEMENT: "I have been doing the HEP but am confused about some of them".   Pt reports that she underwent back surgery on 08/26/2021 and was doing well and without pain.  Approximately 3 months ago, she started feeling like she had a rubber band around her left ankle and numbness and tingling started approx 2 months down left leg.  Patient reports having left hip pain, as well.  PERTINENT HISTORY: lumbar spondylolisthesis s/p L4-5 decompression and fusion on 08/26/2021, osteoporosis, HTN  PAIN:  Are you having pain?  Yes: NPRS scale: 8/10 Pain location: left hip and down left leg (occasionally low back) Pain description: constant, "like a tooth ache" Aggravating factors: standing and walking Relieving factors: sitting  PRECAUTIONS: None  WEIGHT BEARING RESTRICTIONS: No  FALLS:  Has patient fallen in last 6 months? No  LIVING ENVIRONMENT: Lives with: lives with their spouse Lives in: House/apartment Stairs: Yes: External: 3 steps; none Has following equipment at home: None  OCCUPATION: retired  PLOF: Independent and Leisure: enjoys outdoor activities, walking  PATIENT GOALS: To be able to return to her 2 mile walking program  NEXT MD VISIT: Mid-March  OBJECTIVE:   DIAGNOSTIC FINDINGS:  Pt reports that radiographs were performed for her back and it revealed that hardware was intact.  Pt states that she has not had any imaging of her left hip yet.  PATIENT SURVEYS:  FOTO 46% (projected 61% by visit 12)  COGNITION: Overall cognitive status: Within functional limits for tasks assessed     SENSATION: Pt reports numbness and tingling down left leg   MUSCLE LENGTH: 09/09/2022: Hamstrings: Right  82 deg; Left 75 deg (with pain on left side)  POSTURE: No Significant postural limitations  PALPATION: Tenderness to palpation along left hip  LOWER EXTREMITY ROM:  WFL  LOWER EXTREMITY MMT:  Eval: Right LE strength is WFL Left hip strength is 4/5 and left knee is WFL  LOWER EXTREMITY SPECIAL TESTS:  Eval: Hip special tests:  Luisa Hart [facility]) test: positive , SI compression test: positive , SI distraction test: positive , and Hip scouring test: positive  Slump test slight positive on left, negative right  FUNCTIONAL TESTS:  09/09/2022: 5 times sit to stand: 10.6 secs with pain of 8/10 Single leg stance:  Right- 30 sec, Left- 24 sec. Noted to nearly lose balance at multiple occasions during SLS on left LE  GAIT: Distance walked: >500 ft Assistive device utilized: None Level of  assistance: Complete Independence Comments: Pt reports ability to walk for at least 10 min before she needs to stop.  Pt reports that pain starts immediately upon starting to walk.   TODAY'S TREATMENT:                                                                                                                              DATE: 09/09/2022  Reviewed HEP Educated about aquatics PT and provided handout   PATIENT EDUCATION:  Education details: Issued HEP Person educated: Patient Education method: Programmer, multimedia, Facilities manager, and Handouts Education comprehension: verbalized understanding and returned demonstration  HOME EXERCISE PROGRAM: Access Code: [email or web address] URL: [email or web address] Date: 09/09/2022 Prepared by: Reather Laurence  Exercises - Supine Posterior Pelvic Tilt  - 1 x daily - 7 x weekly - 2 sets - 10 reps - Supine Lower Trunk Rotation  - 1 x daily - 7 x weekly - 1 sets - 10 reps - Supine Piriformis Stretch with Foot on Ground  - 1 x daily - 7 x weekly - 1 sets - 2 reps - 20 sec hold  ASSESSMENT:  CLINICAL IMPRESSION: Pt presents to first f/u PT appt with continued L LE symptoms. Reviewed HEP with reports of some confusion with PPT and LTR. Pt reports minimal change with exercises provided. Discussed possibility of getting a nerve stimulator placed if conservative treatments do not work. Placed Estim machine in bracket formation on lower back with reports of relief noted after. Remainder of session with focus on core engagement and lower trunk strengthening. Pt departs with no pain today. Pt will continue to benefit from skilled PT to address continued deficits.   OBJECTIVE IMPAIRMENTS: decreased balance, difficulty walking, decreased strength, increased muscle spasms, and pain.   ACTIVITY LIMITATIONS: standing, squatting, and stairs  PARTICIPATION LIMITATIONS: cleaning, community activity, and yard work  PERSONAL FACTORS: Past/current experiences, Time since  onset of injury/illness/exacerbation, and 3+ comorbidities: L4-5 decompression and fusion on 08/26/21, osteoporosis, hypertension  are also affecting patient's functional outcome.   REHAB POTENTIAL: Good  CLINICAL DECISION MAKING: Evolving/moderate complexity  EVALUATION COMPLEXITY: Moderate   GOALS: Goals reviewed with patient? Yes  SHORT TERM GOALS: Target date: 09/30/2022 Patient will be independent with initial HEP. Baseline: Goal status: INITIAL  2.  Patient will report at least a 30% improvement in pain and functional tasks since initial evaluation. Baseline:  Goal status: INITIAL   LONG TERM GOALS: Target date: 10/31/2022  Patient will be independent with advanced HEP and self progression  in preparation for discharge. Baseline:  Goal status: INITIAL  2.  Patient will increase FOTO score to at least 61% to demonstrate improvements in functional mobilty. Baseline: 46% Goal status: INITIAL  3.  Patient will increase left hip strength to at least 5-/5 to allow for easier ability to negotiate stairs with reciprocal pattern. Baseline:  Goal status: INITIAL  4.  Patient will be able to resume daily ambulation program with pain no greater than 3/10. Baseline:  Goal status: INITIAL  5.  Patient will be able to maintain left single leg stance for at least 30 seconds with good stability to decrease risk of falling. Baseline: 24 sec Goal status: INITIAL  PLAN:  PT FREQUENCY: 2x/week  PT DURATION: 8 weeks  PLANNED INTERVENTIONS: Therapeutic exercises, Therapeutic activity, Neuromuscular re-education, Balance training, Gait training, Patient/Family education, Self Care, Joint mobilization, Joint manipulation, Stair training, Aquatic Therapy, Dry Needling, Electrical stimulation, Spinal manipulation, Spinal mobilization, Cryotherapy, Moist heat, Taping, Traction, Ultrasound, Ionotophoresis 4mg /ml Dexamethasone, Manual therapy, and Re-evaluation  PLAN FOR NEXT SESSION: assess  and progress HEP as indicated, aquatic PT, strengthening/core stability, balance   Royal Hawthorn PT, DPT 09/16/22  10:24 AM

## 2022-09-16 ENCOUNTER — Ambulatory Visit: Payer: Medicare Other | Admitting: Physical Therapy

## 2022-09-16 ENCOUNTER — Encounter: Payer: Self-pay | Admitting: Physical Therapy

## 2022-09-16 DIAGNOSIS — R2689 Other abnormalities of gait and mobility: Secondary | ICD-10-CM

## 2022-09-16 DIAGNOSIS — R252 Cramp and spasm: Secondary | ICD-10-CM

## 2022-09-16 DIAGNOSIS — M6281 Muscle weakness (generalized): Secondary | ICD-10-CM | POA: Diagnosis not present

## 2022-09-16 DIAGNOSIS — R262 Difficulty in walking, not elsewhere classified: Secondary | ICD-10-CM

## 2022-09-16 DIAGNOSIS — M5459 Other low back pain: Secondary | ICD-10-CM

## 2022-09-16 NOTE — Therapy (Unsigned)
OUTPATIENT PHYSICAL THERAPY LOWER EXTREMITY TREATMENT    Patient Name: Susan Woodward MRN: [id number] DOB:1956-03-02, 67 y.o., female Today's Date: 09/17/2022  END OF SESSION:  PT End of Session - 09/17/22 2132     Visit Number 3    Date for PT Re-Evaluation 10/31/22    Authorization Type BC/BS Medicare    Progress Note Due on Visit 10    PT Start Time 1100    PT Stop Time 1145    PT Time Calculation (min) 45 min    Activity Tolerance Patient tolerated treatment well    Behavior During Therapy [facility] for tasks assessed/performed              Past Medical History:  Diagnosis Date   Hypertension    Osteoporosis    Past Surgical History:  Procedure Laterality Date   WRIST SURGERY Right    12 years ago   Patient Active Problem List   Diagnosis Date Noted   Degenerative spondylolisthesis 08/26/2021   Spinal stenosis of lumbar region 07/13/2017   Trochanteric bursitis, right hip 10/01/2016    PCP: Soundra Pilon, FNP  REFERRING PROVIDER: Floreen Comber, NP  REFERRING DIAG: [phone] (ICD-10-CM) - Spinal stenosis, lumbar region with neurogenic claudication  THERAPY DIAG:  Cramp and spasm  Difficulty in walking, not elsewhere classified  Other low back pain  Other abnormalities of gait and mobility  Muscle weakness (generalized)  Rationale for Evaluation and Treatment: Rehabilitation  ONSET DATE: Early December 2023  SUBJECTIVE:   SUBJECTIVE STATEMENT: Leg and hip is about the same as last time.  Pt reports that she underwent back surgery on 08/26/2021 and was doing well and without pain.  Approximately 3 months ago, she started feeling like she had a rubber band around her left ankle and numbness and tingling started approx 2 months down left leg.  Patient reports having left hip pain, as well.  PERTINENT HISTORY: lumbar spondylolisthesis s/p L4-5 decompression and fusion on 08/26/2021, osteoporosis, HTN  PAIN:  Are you having pain? Yes: NPRS scale:  7/10 Pain location: left hip and down left leg (occasionally low back) Pain description: constant, "like a tooth ache" Aggravating factors: standing and walking Relieving factors: sitting  PRECAUTIONS: None  WEIGHT BEARING RESTRICTIONS: No  FALLS:  Has patient fallen in last 6 months? No  LIVING ENVIRONMENT: Lives with: lives with their spouse Lives in: House/apartment Stairs: Yes: External: 3 steps; none Has following equipment at home: None  OCCUPATION: retired  PLOF: Independent and Leisure: enjoys outdoor activities, walking  PATIENT GOALS: To be able to return to her 2 mile walking program  NEXT MD VISIT: Mid-March  OBJECTIVE:   DIAGNOSTIC FINDINGS:  Pt reports that radiographs were performed for her back and it revealed that hardware was intact.  Pt states that she has not had any imaging of her left hip yet.  PATIENT SURVEYS:  FOTO 46% (projected 61% by visit 12)  COGNITION: Overall cognitive status: Within functional limits for tasks assessed     SENSATION: Pt reports numbness and tingling down left leg   MUSCLE LENGTH: 09/09/2022: Hamstrings: Right  82 deg; Left 75 deg (with pain on left side)  POSTURE: No Significant postural limitations  PALPATION: Tenderness to palpation along left hip  LOWER EXTREMITY ROM:  WFL  LOWER EXTREMITY MMT:  Eval: Right LE strength is WFL Left hip strength is 4/5 and left knee is WFL  LOWER EXTREMITY SPECIAL TESTS:  Eval: Hip special tests: Luisa Hart (FABER) test: positive ,  SI compression test: positive , SI distraction test: positive , and Hip scouring test: positive  Slump test slight positive on left, negative right  FUNCTIONAL TESTS:  09/09/2022: 5 times sit to stand: 10.6 secs with pain of 8/10 Single leg stance:  Right- 30 sec, Left- 24 sec. Noted to nearly lose balance at multiple occasions during SLS on left LE  GAIT: Distance walked: >500 ft Assistive device utilized: None Level of assistance:  Complete Independence Comments: Pt reports ability to walk for at least 10 min before she needs to stop.  Pt reports that pain starts immediately upon starting to walk.   TODAY'S TREATMENT:     09/17/22: Pt arrives for aquatic physical therapy. Treatment took place in 3.5-5.5 feet of water. Water temperature was. Pt entered the pool via stairs, slowly and with moderate use of rails. Discussion of pain and current status. Pt requires buoyancy of water for support and to offload joints with strengthening exercises.  Seated water bench with 75% submersion Pt performed seated LE AROM exercises 20x in all planes, 75% depth water walking 4 lengths of each direction. After backwards walking Lt foot increased in symptoms so we tried a seated decompression for 3 min. This helped the foot symptoms calm down and pt could resume her side stepping. Standing against the wall with half noodle core/lat press 10x.                                                                                                                             DATE: 09/09/2022  Reviewed HEP Educated about aquatics PT and provided handout   PATIENT EDUCATION:  Education details: Issued HEP Person educated: Patient Education method: Programmer, multimedia, Facilities manager, and Handouts Education comprehension: verbalized understanding and returned demonstration  HOME EXERCISE PROGRAM: Access Code: [email or web address] URL: [email or web address] Date: 09/09/2022 Prepared by: Reather Laurence  Exercises - Supine Posterior Pelvic Tilt  - 1 x daily - 7 x weekly - 2 sets - 10 reps - Supine Lower Trunk Rotation  - 1 x daily - 7 x weekly - 1 sets - 10 reps - Supine Piriformis Stretch with Foot on Ground  - 1 x daily - 7 x weekly - 1 sets - 2 reps - 20 sec hold  ASSESSMENT:  CLINICAL IMPRESSION: Pt presents with Lt hip and ankle pain to her first aquatic PT treatment. Pt moved slowly due to nerve symptoms in her hip. Pt did have an increase in her  LT foot while walking but was reduced with decompression.Pt has difficulty finding her core muscles.   OBJECTIVE IMPAIRMENTS: decreased balance, difficulty walking, decreased strength, increased muscle spasms, and pain.   ACTIVITY LIMITATIONS: standing, squatting, and stairs  PARTICIPATION LIMITATIONS: cleaning, community activity, and yard work  PERSONAL FACTORS: Past/current experiences, Time since onset of injury/illness/exacerbation, and 3+ comorbidities: L4-5 decompression and fusion on 08/26/21, osteoporosis, hypertension  are also affecting patient's functional outcome.   REHAB POTENTIAL: Good  CLINICAL DECISION MAKING: Evolving/moderate complexity  EVALUATION COMPLEXITY: Moderate   GOALS: Goals reviewed with patient? Yes  SHORT TERM GOALS: Target date: 09/30/2022 Patient will be independent with initial HEP. Baseline: Goal status: INITIAL  2.  Patient will report at least a 30% improvement in pain and functional tasks since initial evaluation. Baseline:  Goal status: INITIAL   LONG TERM GOALS: Target date: 10/31/2022  Patient will be independent with advanced HEP and self progression in preparation for discharge. Baseline:  Goal status: INITIAL  2.  Patient will increase FOTO score to at least 61% to demonstrate improvements in functional mobilty. Baseline: 46% Goal status: INITIAL  3.  Patient will increase left hip strength to at least 5-/5 to allow for easier ability to negotiate stairs with reciprocal pattern. Baseline:  Goal status: INITIAL  4.  Patient will be able to resume daily ambulation program with pain no greater than 3/10. Baseline:  Goal status: INITIAL  5.  Patient will be able to maintain left single leg stance for at least 30 seconds with good stability to decrease risk of falling. Baseline: 24 sec Goal status: INITIAL  PLAN:  PT FREQUENCY: 2x/week  PT DURATION: 8 weeks  PLANNED INTERVENTIONS: Therapeutic exercises, Therapeutic activity,  Neuromuscular re-education, Balance training, Gait training, Patient/Family education, Self Care, Joint mobilization, Joint manipulation, Stair training, Aquatic Therapy, Dry Needling, Electrical stimulation, Spinal manipulation, Spinal mobilization, Cryotherapy, Moist heat, Taping, Traction, Ultrasound, Ionotophoresis 4mg /ml Dexamethasone, Manual therapy, and Re-evaluation  PLAN FOR NEXT SESSION: assess first aquatic session, continue to strengthen her core avoiding nerve irritation.   Ane Payment, PTA 09/17/22 9:33 PM

## 2022-09-17 ENCOUNTER — Ambulatory Visit: Payer: Medicare Other | Admitting: Physical Therapy

## 2022-09-17 ENCOUNTER — Encounter: Payer: Self-pay | Admitting: Physical Therapy

## 2022-09-17 DIAGNOSIS — R262 Difficulty in walking, not elsewhere classified: Secondary | ICD-10-CM

## 2022-09-17 DIAGNOSIS — R252 Cramp and spasm: Secondary | ICD-10-CM | POA: Diagnosis not present

## 2022-09-17 DIAGNOSIS — R2689 Other abnormalities of gait and mobility: Secondary | ICD-10-CM | POA: Diagnosis not present

## 2022-09-17 DIAGNOSIS — M5459 Other low back pain: Secondary | ICD-10-CM | POA: Diagnosis not present

## 2022-09-17 DIAGNOSIS — M6281 Muscle weakness (generalized): Secondary | ICD-10-CM | POA: Diagnosis not present

## 2022-09-18 IMAGING — CT CT ABDOMEN WO/W CM
3 of 12 series · 11 of 46 positions shown, 17 images · IV contrast (OMNIPAQUE 300)
Comparison: MRI lumbar spine January 15, 2021.

CLINICAL DATA: Concern for adrenal mass, elevated norepinephrine
and dopamine with medication resistant hypertension.

EXAM:
CT ABDOMEN WITHOUT AND WITH CONTRAST
TECHNIQUE: Multidetector CT imaging of the abdomen was performed following the
standard protocol before and following the bolus administration of
intravenous contrast.
CONTRAST:  100mL OMNIPAQUE IOHEXOL 300 MG/ML  SOLN

[Series 5: coronal wo · coronal · 0.37mm/px · 2 of 107 slices shown, 3 images]
[im 36/107  soft-tissue]
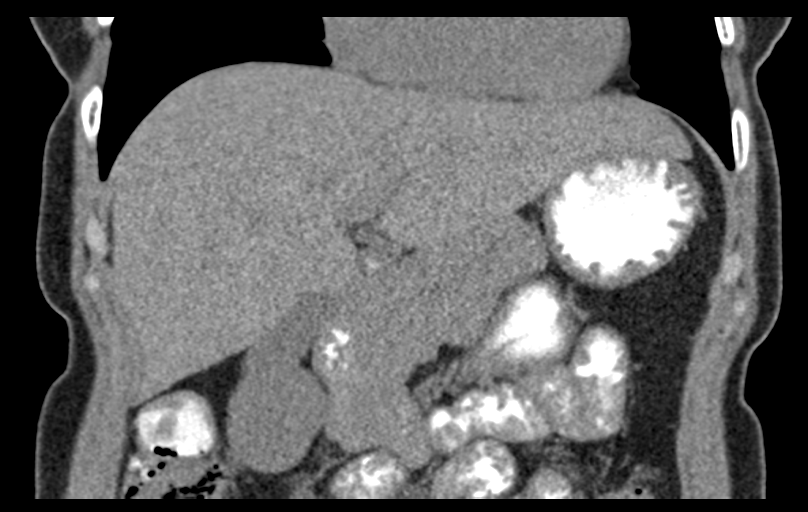
[im 36/107  bone]
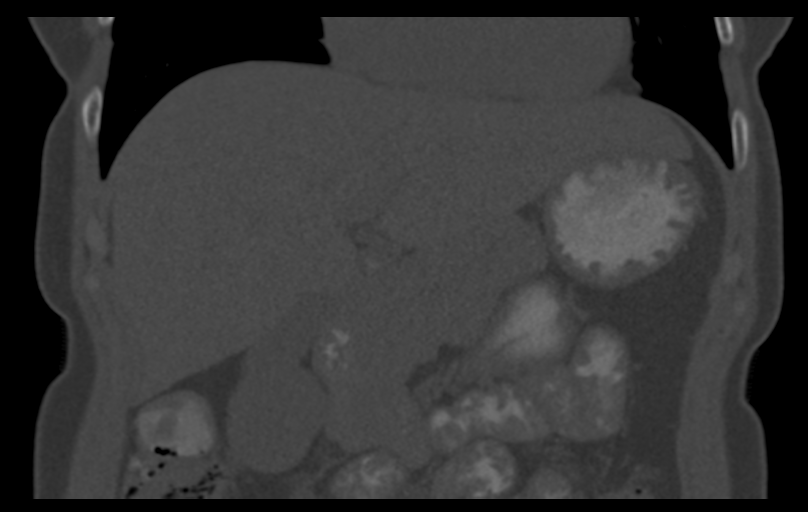
[im 71/107  soft-tissue]
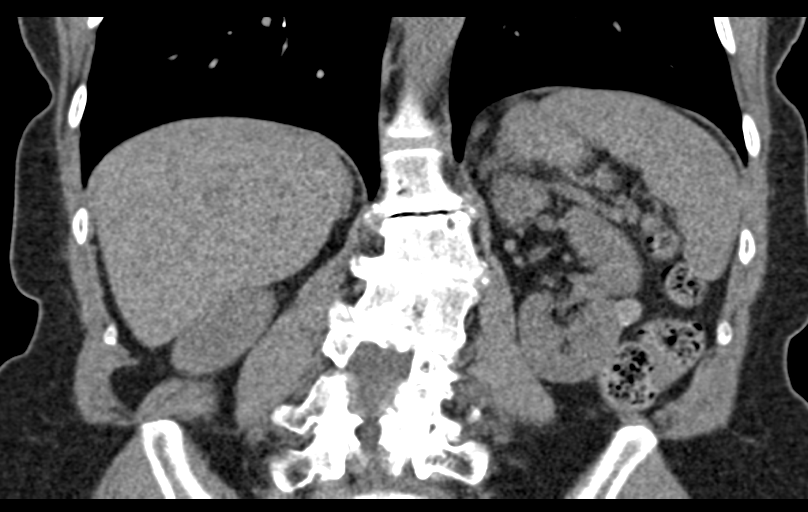

[Series 7: abdomen · axial · 0.62mm/px · z∈[-347,-227]mm · 6 of 58 slices shown, 11 images]
[im 9/58  soft-tissue]
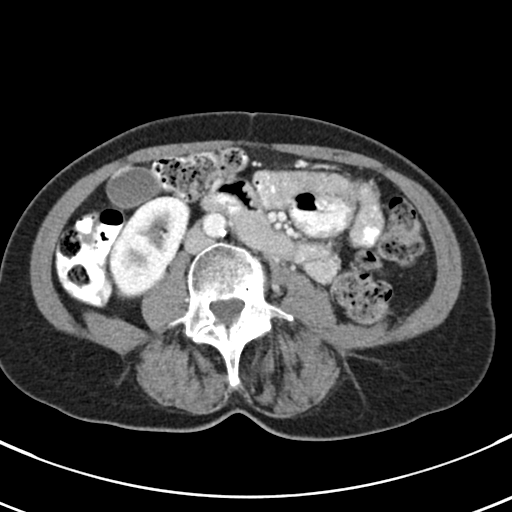
[im 9/58  bone]
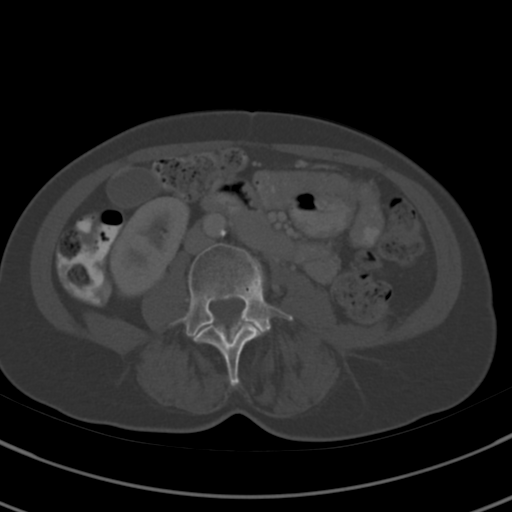
[im 17/58  soft-tissue]
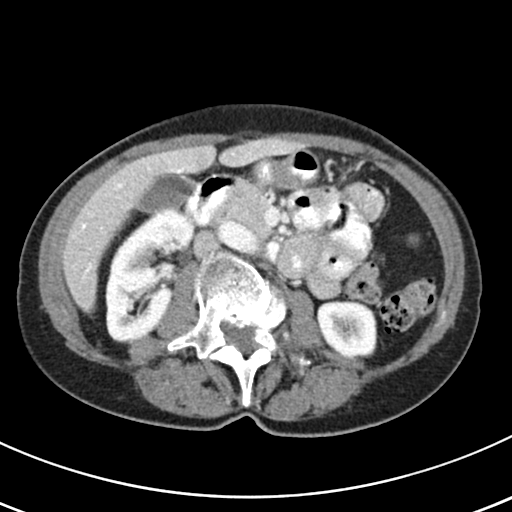
[im 25/58  soft-tissue]
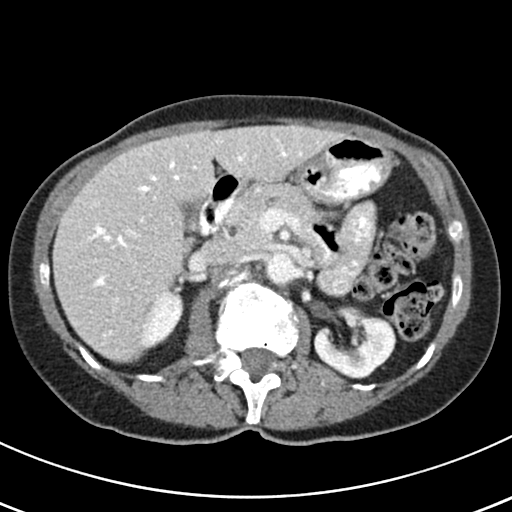
[im 25/58  lung]
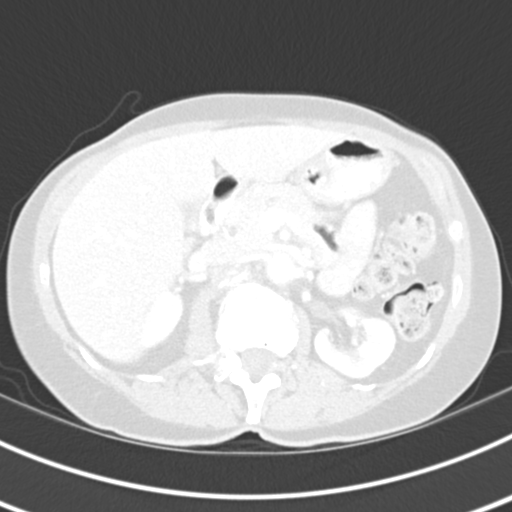
[im 33/58  soft-tissue]
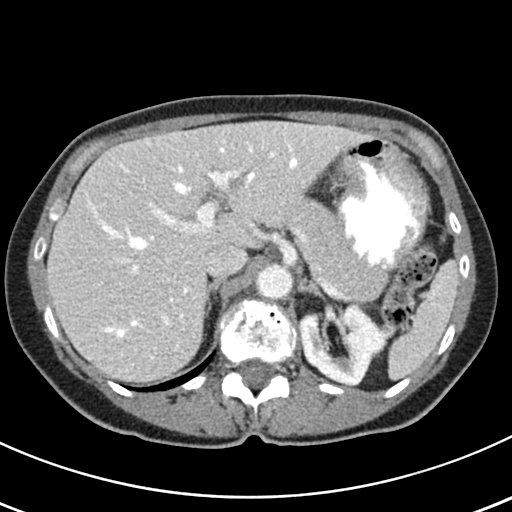
[im 33/58  lung]
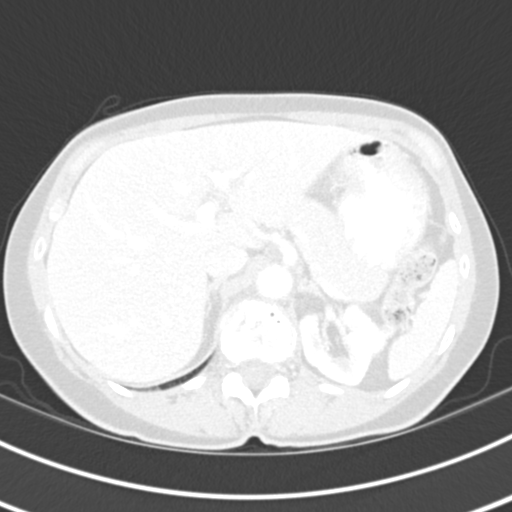
[im 41/58  soft-tissue]
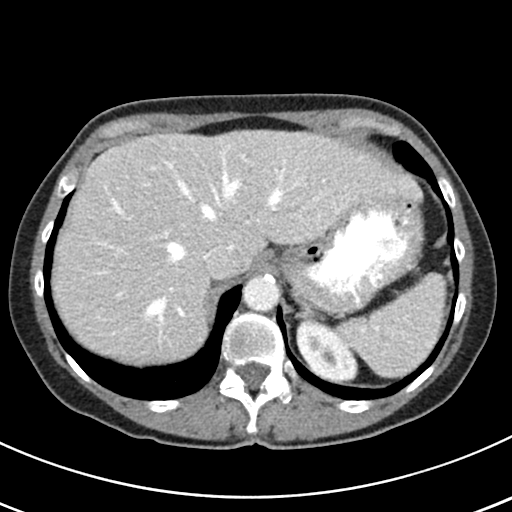
[im 41/58  lung]
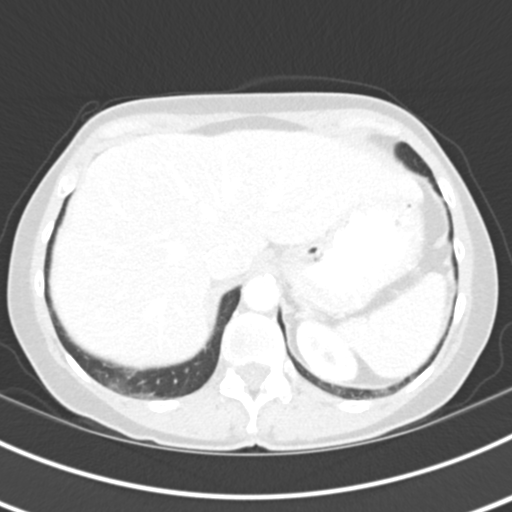
[im 49/58  soft-tissue]
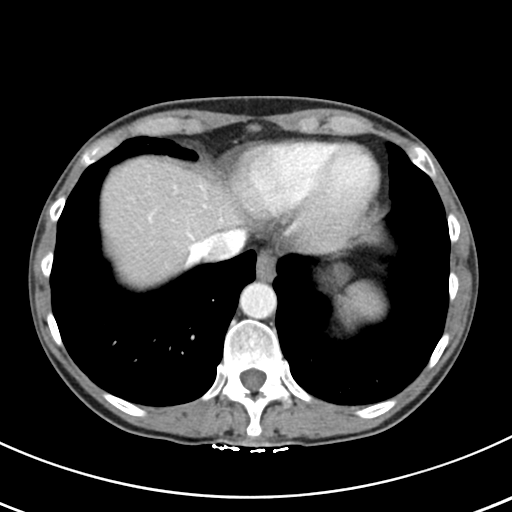
[im 49/58  lung]
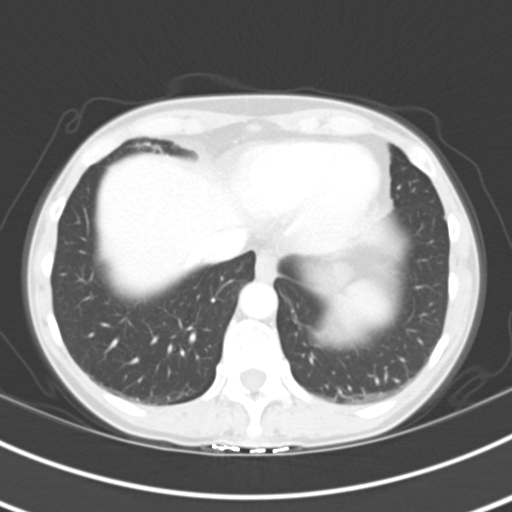

[Series 12: abd delay 15 min · axial · delayed · 0.62mm/px · z∈[-347,-299]mm · 3 of 58 slices shown]
[im 9/58  soft-tissue]
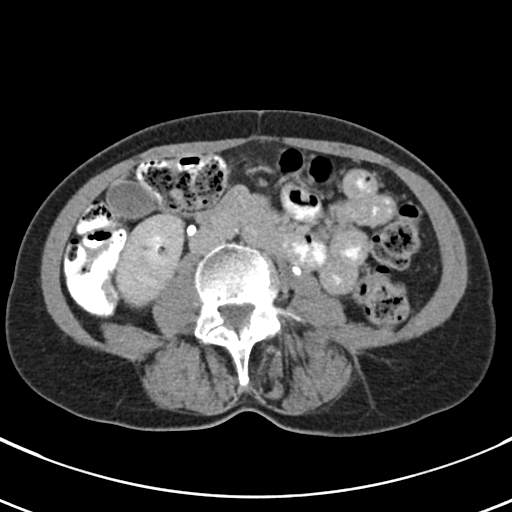
[im 17/58  soft-tissue]
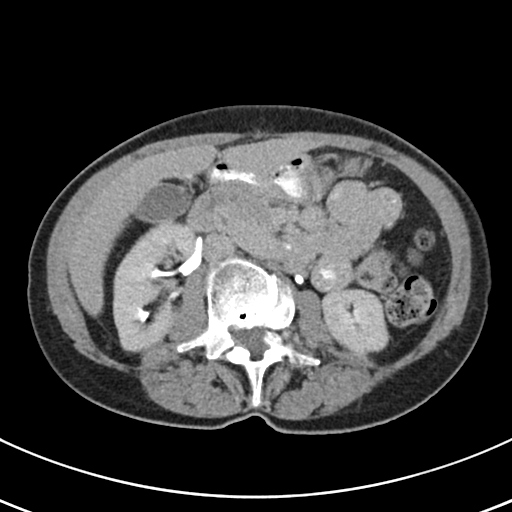
[im 25/58  soft-tissue]
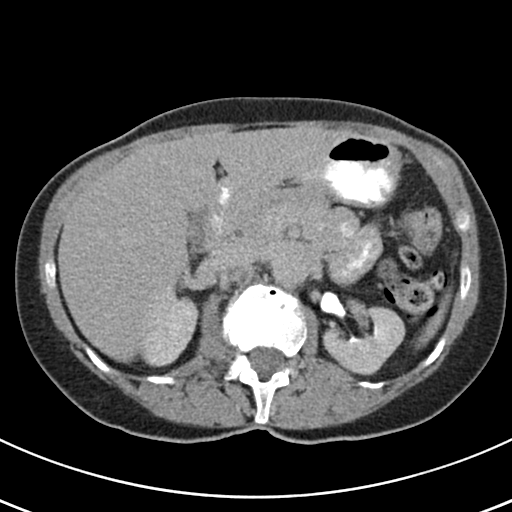

[11 of 46 positions shown; findings below may reference images not displayed]

FINDINGS: Lower chest: Right middle lobe scarring. Normal size heart. No
significant pericardial effusion/thickening.

Hepatobiliary: No suspicious hepatic lesion. Gallbladder is
unremarkable. No biliary ductal dilation.

Pancreas: No pancreatic ductal dilation or evidence of acute
inflammation. No solid hyperenhancing pancreatic lesion identified.

Spleen: Normal in size without focal abnormality.

Adrenals/Urinary Tract: Bilateral adrenal glands appear normal. No
hydronephrosis. Exophytic intrinsically hyperdense nonenhancing left
lower pole renal lesion measuring 12 mm on image [DATE] consistent
with a benign hemorrhagic/proteinaceous renal cyst. No solid
enhancing renal mass.

Stomach/Bowel: Radiopaque enteric contrast material traverses the
cecum. Stomach is unremarkable for degree of distension. No
pathologic dilation or evidence of acute inflammation involving
loops of large or small bowel in the abdomen. Colonic diverticulosis
without findings of acute diverticulitis.

Vascular/Lymphatic: Scattered aortic and branch vessel
atherosclerosis without abdominal aortic aneurysm. No pathologically
enlarged abdominal lymph nodes.

Other: No significant abdominal free fluid.

Musculoskeletal: Advanced multilevel degenerative changes spine. No
acute osseous abnormality.
IMPRESSION: 1. Bilateral adrenal glands appear normal without identifiable
nodularity/mass.
2. Colonic diverticulosis without findings of acute diverticulitis.
3.  Aortic Atherosclerosis ([id number]).

## 2022-09-23 ENCOUNTER — Ambulatory Visit (HOSPITAL_BASED_OUTPATIENT_CLINIC_OR_DEPARTMENT_OTHER): Payer: Medicare Other | Attending: Student | Admitting: Physical Therapy

## 2022-09-23 ENCOUNTER — Encounter (HOSPITAL_BASED_OUTPATIENT_CLINIC_OR_DEPARTMENT_OTHER): Payer: Self-pay | Admitting: Physical Therapy

## 2022-09-23 DIAGNOSIS — R262 Difficulty in walking, not elsewhere classified: Secondary | ICD-10-CM | POA: Diagnosis not present

## 2022-09-23 DIAGNOSIS — R2689 Other abnormalities of gait and mobility: Secondary | ICD-10-CM | POA: Insufficient documentation

## 2022-09-23 DIAGNOSIS — M5459 Other low back pain: Secondary | ICD-10-CM | POA: Diagnosis not present

## 2022-09-23 DIAGNOSIS — R252 Cramp and spasm: Secondary | ICD-10-CM | POA: Insufficient documentation

## 2022-09-23 NOTE — Therapy (Signed)
OUTPATIENT PHYSICAL THERAPY LOWER EXTREMITY TREATMENT    Patient Name: Susan Woodward MRN: [id number] DOB:09/04/1955, 67 y.o., female Today's Date: 09/23/2022  END OF SESSION:  PT End of Session - 09/23/22 1130     Visit Number 4    Date for PT Re-Evaluation 10/31/22    Authorization Type BC/BS Medicare    Progress Note Due on Visit 10    PT Start Time 1117    PT Stop Time 1156    PT Time Calculation (min) 39 min    Behavior During Therapy [facility] for tasks assessed/performed              Past Medical History:  Diagnosis Date   Hypertension    Osteoporosis    Past Surgical History:  Procedure Laterality Date   WRIST SURGERY Right    12 years ago   Patient Active Problem List   Diagnosis Date Noted   Degenerative spondylolisthesis 08/26/2021   Spinal stenosis of lumbar region 07/13/2017   Trochanteric bursitis, right hip 10/01/2016    PCP: Soundra Pilon, FNP  REFERRING PROVIDER: Floreen Comber, NP  REFERRING DIAG: [phone] (ICD-10-CM) - Spinal stenosis, lumbar region with neurogenic claudication  THERAPY DIAG:  Cramp and spasm  Difficulty in walking, not elsewhere classified  Other low back pain  Other abnormalities of gait and mobility  Rationale for Evaluation and Treatment: Rehabilitation  ONSET DATE: Early December 2023  SUBJECTIVE:   SUBJECTIVE STATEMENT: "I think it's been a better week, but I still can't walk....even to the mailbox and back". Pt reports compliance with HEP.  She reports she noticed she can't raise her arms over shoulder height without pain shooting down her leg.   She thinks that holding the noodle at shoulder height may have been what increased her LLE pain last visit.   PERTINENT HISTORY: lumbar spondylolisthesis s/p L4-5 decompression and fusion on 08/26/2021, osteoporosis, HTN  PAIN:  Are you having pain? yes: NPRS scale: 6/10 Pain location:  Lt hip down LLE to ankle Pain description: it is constant, "like a tooth  ache" Aggravating factors: standing and walking Relieving factors: sitting  PRECAUTIONS: None  WEIGHT BEARING RESTRICTIONS: No  FALLS:  Has patient fallen in last 6 months? No  LIVING ENVIRONMENT: Lives with: lives with their spouse Lives in: House/apartment Stairs: Yes: External: 3 steps; none Has following equipment at home: None  OCCUPATION: retired  PLOF: Independent and Leisure: enjoys outdoor activities, walking  PATIENT GOALS: To be able to return to her 2 mile walking program  NEXT MD VISIT: Mid-March  OBJECTIVE:   DIAGNOSTIC FINDINGS:  Pt reports that radiographs were performed for her back and it revealed that hardware was intact.  Pt states that she has not had any imaging of her left hip yet.  PATIENT SURVEYS:  FOTO 46% (projected 61% by visit 12)  COGNITION: Overall cognitive status: Within functional limits for tasks assessed     SENSATION: Pt reports numbness and tingling down left leg   MUSCLE LENGTH: 09/09/2022: Hamstrings: Right  82 deg; Left 75 deg (with pain on left side)  POSTURE: No Significant postural limitations  PALPATION: Tenderness to palpation along left hip  LOWER EXTREMITY ROM:  WFL  LOWER EXTREMITY MMT:  Eval: Right LE strength is WFL Left hip strength is 4/5 and left knee is WFL  LOWER EXTREMITY SPECIAL TESTS:  Eval: Hip special tests: Luisa Hart (FABER) test: positive , SI compression test: positive , SI distraction test: positive , and Hip scouring test:  positive  Slump test slight positive on left, negative right  FUNCTIONAL TESTS:  09/09/2022: 5 times sit to stand: 10.6 secs with pain of 8/10 Single leg stance:  Right- 30 sec, Left- 24 sec. Noted to nearly lose balance at multiple occasions during SLS on left LE  GAIT: Distance walked: >500 ft Assistive device utilized: None Level of assistance: Complete Independence Comments: Pt reports ability to walk for at least 10 min before she needs to stop.  Pt reports  that pain starts immediately upon starting to walk.   TODAY'S TREATMENT:     Pt seen for aquatic therapy today.  Treatment took place in water 3.25-4.5 ft in depth at the [facility] pool. Temp of water was 91.  Pt entered/exited the pool via stairs independently with bilat rail. * at 57ft 6" side stepping 2 laps, forward/ backward walking 2 laps, arms relaxed at side * straddling yellow noodle in deepest water:  gentle cycling, cc ski, hip abdct/addct with LE suspended,  LE suspended with arm abdct/ addct and reciprocal motion; cycling with breast stroke arms  * trial of aqua jogger belt and cycling; then with added UE on hand floats but arms relaxed * holding wall at 4 ft:  Lt / Rt hip ext to toe touch x 8 each LE * once dried off:  at bench, Lt hip flexor stretch (sitting on Rt buttock) no pain,  and then with added Lt arm raise  over head (no pain) x 15s x 2.    Pt requires the buoyancy and hydrostatic pressure of water for support, and to offload joints by unweighting joint load by at least 50 % in navel deep water and by at least 75-80% in chest to neck deep water.  Viscosity of the water is needed for resistance of strengthening. Water current perturbations provides challenge to standing balance requiring increased core activation.                                                                                                                              DATE: 09/09/2022  Reviewed HEP Educated about aquatics PT and provided handout   PATIENT EDUCATION:  Education details: aquatic modifications  Person educated: Patient Education method: Programmer, multimedia, Facilities manager, and Handouts Education comprehension: verbalized understanding and returned demonstration  HOME EXERCISE PROGRAM: Access Code: [email or web address] URL: [email or web address] Date: 09/09/2022 Prepared by: Clydie Braun Menke  Exercises - Supine Posterior Pelvic Tilt  - 1 x daily - 7 x weekly - 2 sets - 10  reps - Supine Lower Trunk Rotation  - 1 x daily - 7 x weekly - 1 sets - 10 reps - Supine Piriformis Stretch with Foot on Ground  - 1 x daily - 7 x weekly - 1 sets - 2 reps - 20 sec hold  ASSESSMENT:  CLINICAL IMPRESSION: Pt had significant relief of symptoms while exercising while suspended on noodle (not as much with aqua jogger). At one point, she reported only  sensation of "tightness" at left ankle and no pain.  Pain had decreased to 3/10 in LLE upon exiting the water.  Trialed seated hip flexor stretch with good tolerance.  Pt will be at beach next week; will progress water and land exercises as tolerated. Goals are ongoing.   OBJECTIVE IMPAIRMENTS: decreased balance, difficulty walking, decreased strength, increased muscle spasms, and pain.   ACTIVITY LIMITATIONS: standing, squatting, and stairs  PARTICIPATION LIMITATIONS: cleaning, community activity, and yard work  PERSONAL FACTORS: Past/current experiences, Time since onset of injury/illness/exacerbation, and 3+ comorbidities: L4-5 decompression and fusion on 08/26/21, osteoporosis, hypertension  are also affecting patient's functional outcome.   REHAB POTENTIAL: Good  CLINICAL DECISION MAKING: Evolving/moderate complexity  EVALUATION COMPLEXITY: Moderate   GOALS: Goals reviewed with patient? Yes  SHORT TERM GOALS: Target date: 09/30/2022 Patient will be independent with initial HEP. Baseline: Goal status: INITIAL  2.  Patient will report at least a 30% improvement in pain and functional tasks since initial evaluation. Baseline:  Goal status: INITIAL   LONG TERM GOALS: Target date: 10/31/2022  Patient will be independent with advanced HEP and self progression in preparation for discharge. Baseline:  Goal status: INITIAL  2.  Patient will increase FOTO score to at least 61% to demonstrate improvements in functional mobilty. Baseline: 46% Goal status: INITIAL  3.  Patient will increase left hip strength to at least  5-/5 to allow for easier ability to negotiate stairs with reciprocal pattern. Baseline:  Goal status: INITIAL  4.  Patient will be able to resume daily ambulation program with pain no greater than 3/10. Baseline:  Goal status: INITIAL  5.  Patient will be able to maintain left single leg stance for at least 30 seconds with good stability to decrease risk of falling. Baseline: 24 sec Goal status: INITIAL  PLAN:  PT FREQUENCY: 2x/week  PT DURATION: 8 weeks  PLANNED INTERVENTIONS: Therapeutic exercises, Therapeutic activity, Neuromuscular re-education, Balance training, Gait training, Patient/Family education, Self Care, Joint mobilization, Joint manipulation, Stair training, Aquatic Therapy, Dry Needling, Electrical stimulation, Spinal manipulation, Spinal mobilization, Cryotherapy, Moist heat, Taping, Traction, Ultrasound, Ionotophoresis 4mg /ml Dexamethasone, Manual therapy, and Re-evaluation  PLAN FOR NEXT SESSION: assess response to aquatic session, continue to strengthen her core avoiding nerve irritation.   Mayer Camel, PTA 09/23/22 1:48 PM [facility] [address] [place], [place], [address] Phone: [phone]   Fax:  [phone]

## 2022-09-24 ENCOUNTER — Ambulatory Visit: Payer: Medicare Other | Admitting: Orthopaedic Surgery

## 2022-09-24 ENCOUNTER — Ambulatory Visit: Payer: Medicare Other

## 2022-09-24 DIAGNOSIS — R252 Cramp and spasm: Secondary | ICD-10-CM | POA: Diagnosis not present

## 2022-09-24 DIAGNOSIS — M5459 Other low back pain: Secondary | ICD-10-CM

## 2022-09-24 DIAGNOSIS — R262 Difficulty in walking, not elsewhere classified: Secondary | ICD-10-CM

## 2022-09-24 DIAGNOSIS — M6281 Muscle weakness (generalized): Secondary | ICD-10-CM

## 2022-09-24 DIAGNOSIS — R2689 Other abnormalities of gait and mobility: Secondary | ICD-10-CM | POA: Diagnosis not present

## 2022-09-24 NOTE — Therapy (Signed)
OUTPATIENT PHYSICAL THERAPY LOWER EXTREMITY TREATMENT    Patient Name: Susan Woodward MRN: [id number] DOB:03/28/1956, 67 y.o., female Today's Date: 09/24/2022  END OF SESSION:  PT End of Session - 09/24/22 0933     Visit Number 5    Date for PT Re-Evaluation 10/31/22    Authorization Type BC/BS Medicare    Progress Note Due on Visit 10    PT Start Time 0933    PT Stop Time 1021    PT Time Calculation (min) 48 min    Activity Tolerance Patient tolerated treatment well    Behavior During Therapy [facility] for tasks assessed/performed              Past Medical History:  Diagnosis Date   Hypertension    Osteoporosis    Past Surgical History:  Procedure Laterality Date   WRIST SURGERY Right    12 years ago   Patient Active Problem List   Diagnosis Date Noted   Degenerative spondylolisthesis 08/26/2021   Spinal stenosis of lumbar region 07/13/2017   Trochanteric bursitis, right hip 10/01/2016    PCP: Soundra Pilon, FNP  REFERRING PROVIDER: Floreen Comber, NP  REFERRING DIAG: [phone] (ICD-10-CM) - Spinal stenosis, lumbar region with neurogenic claudication  THERAPY DIAG:  Cramp and spasm  Difficulty in walking, not elsewhere classified  Other low back pain  Muscle weakness (generalized)  Rationale for Evaluation and Treatment: Rehabilitation  ONSET DATE: Early December 2023  SUBJECTIVE:   SUBJECTIVE STATEMENT: Patient reports left leg is tingling and irritated just from walking from parking lot into clinic. Overall about 30% better.   PERTINENT HISTORY: lumbar spondylolisthesis s/p L4-5 decompression and fusion on 08/26/2021, osteoporosis, HTN  PAIN:  Are you having pain? yes: NPRS scale: 6/10 Pain location:  Lt hip down LLE to ankle Pain description: it is constant, "like a tooth ache" Aggravating factors: standing and walking Relieving factors: sitting  PRECAUTIONS: None  WEIGHT BEARING RESTRICTIONS: No  FALLS:  Has patient fallen in last 6  months? No  LIVING ENVIRONMENT: Lives with: lives with their spouse Lives in: House/apartment Stairs: Yes: External: 3 steps; none Has following equipment at home: None  OCCUPATION: retired  PLOF: Independent and Leisure: enjoys outdoor activities, walking  PATIENT GOALS: To be able to return to her 2 mile walking program  NEXT MD VISIT: Mid-March  OBJECTIVE:   DIAGNOSTIC FINDINGS:  Pt reports that radiographs were performed for her back and it revealed that hardware was intact.  Pt states that she has not had any imaging of her left hip yet.  PATIENT SURVEYS:  FOTO 46% (projected 61% by visit 12)  COGNITION: Overall cognitive status: Within functional limits for tasks assessed     SENSATION: Pt reports numbness and tingling down left leg   MUSCLE LENGTH: 09/09/2022: Hamstrings: Right  82 deg; Left 75 deg (with pain on left side)  POSTURE: No Significant postural limitations  PALPATION: Tenderness to palpation along left hip  LOWER EXTREMITY ROM:  WFL  LOWER EXTREMITY MMT:  Eval: Right LE strength is WFL Left hip strength is 4/5 and left knee is WFL  LOWER EXTREMITY SPECIAL TESTS:  Eval: Hip special tests: Luisa Hart (FABER) test: positive , SI compression test: positive , SI distraction test: positive , and Hip scouring test: positive  Slump test slight positive on left, negative right  FUNCTIONAL TESTS:  09/09/2022: 5 times sit to stand: 10.6 secs with pain of 8/10 Single leg stance:  Right- 30 sec, Left- 24 sec.  Noted to nearly lose balance at multiple occasions during SLS on left LE  GAIT: Distance walked: >500 ft Assistive device utilized: None Level of assistance: Complete Independence Comments: Pt reports ability to walk for at least 10 min before she needs to stop.  Pt reports that pain starts immediately upon starting to walk.   TODAY'S TREATMENT:  09/24/22   Prone lying x 2 min (leg symptoms eliminated) Prone on elbows attempted but after  approx 1 min patient felt very mild return of symptoms. Nu step x 5 min level 5 Supine hamstring, IT band and piriformis stretches 2 x 20 sec each PPT x 20 PPT with 90/90 heel tap x 20 PPT with dying bug x 20 Ice to lumbar spine in supine hook lying with bolster under knees x 10 min  Pt seen for aquatic therapy today.  Treatment took place in water 3.25-4.5 ft in depth at the [facility] pool. Temp of water was 91.  Pt entered/exited the pool via stairs independently with bilat rail. * at 76ft 6" side stepping 2 laps, forward/ backward walking 2 laps, arms relaxed at side * straddling yellow noodle in deepest water:  gentle cycling, cc ski, hip abdct/addct with LE suspended,  LE suspended with arm abdct/ addct and reciprocal motion; cycling with breast stroke arms  * trial of aqua jogger belt and cycling; then with added UE on hand floats but arms relaxed * holding wall at 4 ft:  Lt / Rt hip ext to toe touch x 8 each LE * once dried off:  at bench, Lt hip flexor stretch (sitting on Rt buttock) no pain,  and then with added Lt arm raise  over head (no pain) x 15s x 2.    Pt requires the buoyancy and hydrostatic pressure of water for support, and to offload joints by unweighting joint load by at least 50 % in navel deep water and by at least 75-80% in chest to neck deep water.  Viscosity of the water is needed for resistance of strengthening. Water current perturbations provides challenge to standing balance requiring increased core activation.                                                                                                                              DATE: 09/09/2022  Reviewed HEP Educated about aquatics PT and provided handout   PATIENT EDUCATION:  Education details: aquatic modifications  Person educated: Patient Education method: Programmer, multimedia, Facilities manager, and Handouts Education comprehension: verbalized understanding and returned demonstration  HOME EXERCISE  PROGRAM: Access Code: [email or web address] URL: [email or web address] Date: 09/24/2022 Prepared by: Mikey Kirschner  Exercises - Supine Posterior Pelvic Tilt  - 1 x daily - 7 x weekly - 2 sets - 10 reps - Supine Lower Trunk Rotation  - 1 x daily - 7 x weekly - 1 sets - 10 reps - Supine Piriformis Stretch with Foot on Ground  - 1 x daily - 7 x weekly -  1 sets - 2 reps - 20 sec hold - Lying Prone  - 1 x daily - 7 x weekly - 3 sets - 10 reps - Supine Hamstring Stretch with Strap  - 1 x daily - 7 x weekly - 1 sets - 3 reps - 20 sec hold - Supine ITB Stretch with Strap  - 1 x daily - 7 x weekly - 1 sets - 3 reps - 20 sec hold - Supine Piriformis Stretch  - 1 x daily - 7 x weekly - 1 sets - 3 reps - 20 sec hold - Supine 90/90 Alternating Heel Touches with Posterior Pelvic Tilt  - 1 x daily - 7 x weekly - 1 sets - 20 reps - Dead Bug  - 1 x daily - 7 x weekly - 1 sets - 20 reps  ASSESSMENT:  CLINICAL IMPRESSION: Dashae had some relief and centralization of radicular symptoms with prone lying but symptoms seem to have slowly returned with prone on elbows.  Suspect possible bulge at L5-S1 but will need to proceed with caution with prone activity, monitoring symptoms closely. She was able to tolerate all other activities today with no issues.  She would benefit from continuing skilled PT to address core weakness and LE flexibility issues along with centralizing radicular symptoms.     OBJECTIVE IMPAIRMENTS: decreased balance, difficulty walking, decreased strength, increased muscle spasms, and pain.   ACTIVITY LIMITATIONS: standing, squatting, and stairs  PARTICIPATION LIMITATIONS: cleaning, community activity, and yard work  PERSONAL FACTORS: Past/current experiences, Time since onset of injury/illness/exacerbation, and 3+ comorbidities: L4-5 decompression and fusion on 08/26/21, osteoporosis, hypertension  are also affecting patient's functional outcome.   REHAB POTENTIAL: Good  CLINICAL  DECISION MAKING: Evolving/moderate complexity  EVALUATION COMPLEXITY: Moderate   GOALS: Goals reviewed with patient? Yes  SHORT TERM GOALS: Target date: 09/30/2022 Patient will be independent with initial HEP. Baseline: Goal status: INITIAL  2.  Patient will report at least a 30% improvement in pain and functional tasks since initial evaluation. Baseline:  Goal status: INITIAL   LONG TERM GOALS: Target date: 10/31/2022  Patient will be independent with advanced HEP and self progression in preparation for discharge. Baseline:  Goal status: INITIAL  2.  Patient will increase FOTO score to at least 61% to demonstrate improvements in functional mobilty. Baseline: 46% Goal status: INITIAL  3.  Patient will increase left hip strength to at least 5-/5 to allow for easier ability to negotiate stairs with reciprocal pattern. Baseline:  Goal status: INITIAL  4.  Patient will be able to resume daily ambulation program with pain no greater than 3/10. Baseline:  Goal status: INITIAL  5.  Patient will be able to maintain left single leg stance for at least 30 seconds with good stability to decrease risk of falling. Baseline: 24 sec Goal status: INITIAL  PLAN:  PT FREQUENCY: 2x/week  PT DURATION: 8 weeks  PLANNED INTERVENTIONS: Therapeutic exercises, Therapeutic activity, Neuromuscular re-education, Balance training, Gait training, Patient/Family education, Self Care, Joint mobilization, Joint manipulation, Stair training, Aquatic Therapy, Dry Needling, Electrical stimulation, Spinal manipulation, Spinal mobilization, Cryotherapy, Moist heat, Taping, Traction, Ultrasound, Ionotophoresis 4mg /ml Dexamethasone, Manual therapy, and Re-evaluation  PLAN FOR NEXT SESSION: assess response to aquatic session, continue to strengthen her core avoiding nerve irritation.   Mayer Camel, PTA 09/24/22 10:31 AM [facility] [address] [place], [place], [address] Phone: [phone]   Fax:  [phone]

## 2022-10-01 ENCOUNTER — Ambulatory Visit: Payer: Medicare Other | Admitting: Physical Therapy

## 2022-10-03 ENCOUNTER — Encounter: Payer: Medicare Other | Admitting: Rehabilitative and Restorative Service Providers"

## 2022-10-06 ENCOUNTER — Encounter: Payer: Self-pay | Admitting: Rehabilitative and Restorative Service Providers"

## 2022-10-06 ENCOUNTER — Ambulatory Visit: Payer: Medicare Other | Attending: Student | Admitting: Rehabilitative and Restorative Service Providers"

## 2022-10-06 DIAGNOSIS — M5459 Other low back pain: Secondary | ICD-10-CM | POA: Diagnosis not present

## 2022-10-06 DIAGNOSIS — R252 Cramp and spasm: Secondary | ICD-10-CM

## 2022-10-06 DIAGNOSIS — R2689 Other abnormalities of gait and mobility: Secondary | ICD-10-CM

## 2022-10-06 DIAGNOSIS — M6281 Muscle weakness (generalized): Secondary | ICD-10-CM | POA: Diagnosis not present

## 2022-10-06 DIAGNOSIS — R262 Difficulty in walking, not elsewhere classified: Secondary | ICD-10-CM

## 2022-10-06 NOTE — Therapy (Signed)
OUTPATIENT PHYSICAL THERAPY TREATMENT NOTE   Patient Name: Susan Woodward MRN: [id number] DOB:[birth year], 67 y.o., female Today's Date: 10/06/2022  END OF SESSION:  PT End of Session - 10/06/22 1107     Visit Number 6    Date for PT Re-Evaluation 10/31/22    Authorization Type BC/BS Medicare    Progress Note Due on Visit 10    PT Start Time 1100    PT Stop Time 1140    PT Time Calculation (min) 40 min    Activity Tolerance Patient tolerated treatment well    Behavior During Therapy [facility] for tasks assessed/performed              Past Medical History:  Diagnosis Date   Hypertension    Osteoporosis    Past Surgical History:  Procedure Laterality Date   WRIST SURGERY Right    12 years ago   Patient Active Problem List   Diagnosis Date Noted   Degenerative spondylolisthesis 08/26/2021   Spinal stenosis of lumbar region 07/13/2017   Trochanteric bursitis, right hip 10/01/2016    PCP: Soundra Pilon, FNP  REFERRING PROVIDER: Floreen Comber, NP  REFERRING DIAG: [phone] (ICD-10-CM) - Spinal stenosis, lumbar region with neurogenic claudication  THERAPY DIAG:  Cramp and spasm  Difficulty in walking, not elsewhere classified  Other low back pain  Muscle weakness (generalized)  Other abnormalities of gait and mobility  Rationale for Evaluation and Treatment: Rehabilitation  ONSET DATE: Early December 2023  SUBJECTIVE:   SUBJECTIVE STATEMENT:  Pt reports that she was compliant with her HEP over the past week at the beach, but she is having increased pain today.  States that she was standing and on her feet for  a long time yesterday cooking.  PERTINENT HISTORY: lumbar spondylolisthesis s/p L4-5 decompression and fusion on 08/26/2021, osteoporosis, HTN  PAIN:  Are you having pain? yes: NPRS scale: 6-7/10 Pain location:  Lt hip down LLE to ankle Pain description: it is constant, "like a tooth ache" Aggravating factors: standing and walking Relieving  factors: sitting  PRECAUTIONS: None  WEIGHT BEARING RESTRICTIONS: No  FALLS:  Has patient fallen in last 6 months? No  LIVING ENVIRONMENT: Lives with: lives with their spouse Lives in: House/apartment Stairs: Yes: External: 3 steps; none Has following equipment at home: None  OCCUPATION: retired  PLOF: Independent and Leisure: enjoys outdoor activities, walking  PATIENT GOALS: To be able to return to her 2 mile walking program  NEXT MD VISIT: Mid-March  OBJECTIVE:   DIAGNOSTIC FINDINGS:  Pt reports that radiographs were performed for her back and it revealed that hardware was intact.  Pt states that she has not had any imaging of her left hip yet.  PATIENT SURVEYS:  FOTO 46% (projected 61% by visit 12)  COGNITION: Overall cognitive status: Within functional limits for tasks assessed     SENSATION: Pt reports numbness and tingling down left leg   MUSCLE LENGTH: 09/09/2022: Hamstrings: Right  82 deg; Left 75 deg (with pain on left side)  POSTURE: No Significant postural limitations  PALPATION: Tenderness to palpation along left hip  LOWER EXTREMITY ROM:  WFL  LOWER EXTREMITY MMT:  Eval: Right LE strength is WFL Left hip strength is 4/5 and left knee is WFL  LOWER EXTREMITY SPECIAL TESTS:  Eval: Hip special tests: Luisa Hart (FABER) test: positive , SI compression test: positive , SI distraction test: positive , and Hip scouring test: positive  Slump test slight positive on left, negative right  FUNCTIONAL TESTS:  09/09/2022: 5 times sit to stand: 10.6 secs with pain of 8/10 Single leg stance:  Right- 30 sec, Left- 24 sec. Noted to nearly lose balance at multiple occasions during SLS on left LE  GAIT: Distance walked: >500 ft Assistive device utilized: None Level of assistance: Complete Independence Comments: Pt reports ability to walk for at least 10 min before she needs to stop.  Pt reports that pain starts immediately upon starting to  walk.   TODAY'S TREATMENT:   DATE: 10/06/2022  Nustep level 5 x6 min with PT present to discuss status Seated hamstring stretch 2x20 sec bilat Seated piriformis stretch 2x20 sec bilat Seated on green pball:  CW and CCW pelvic circles, marching, LAQ. X20 bilat Prone on elbows x2 min with some symptom resolution of LLE pain Hooklying posterior pelvic tilt (PPT) 2x10 PPT with 90/90 heel tap  2x10 PPT with dying bug 2x10 (with cuing for increased TA contraction to decrease strain on back) Supine bent knee fallouts x10   09/24/22   Prone lying x 2 min (leg symptoms eliminated) Prone on elbows attempted but after approx 1 min patient felt very mild return of symptoms. Nu step x 5 min level 5 Supine hamstring, IT band and piriformis stretches 2 x 20 sec each PPT x 20 PPT with 90/90 heel tap x 20 PPT with dying bug x 20 Ice to lumbar spine in supine hook lying with bolster under knees x 10 min  DATE: 09/23/2022  Pt seen for aquatic therapy today.  Treatment took place in water 3.25-4.5 ft in depth at the [facility] pool. Temp of water was 91.  Pt entered/exited the pool via stairs independently with bilat rail. * at 65ft 6" side stepping 2 laps, forward/ backward walking 2 laps, arms relaxed at side * straddling yellow noodle in deepest water:  gentle cycling, cc ski, hip abdct/addct with LE suspended,  LE suspended with arm abdct/ addct and reciprocal motion; cycling with breast stroke arms  * trial of aqua jogger belt and cycling; then with added UE on hand floats but arms relaxed * holding wall at 4 ft:  Lt / Rt hip ext to toe touch x 8 each LE * once dried off:  at bench, Lt hip flexor stretch (sitting on Rt buttock) no pain,  and then with added Lt arm raise  over head (no pain) x 15s x 2.    Pt requires the buoyancy and hydrostatic pressure of water for support, and to offload joints by unweighting joint load by at least 50 % in navel deep water and by at least 75-80% in  chest to neck deep water.  Viscosity of the water is needed for resistance of strengthening. Water current perturbations provides challenge to standing balance requiring increased core activation.  PATIENT EDUCATION:  Education details: aquatic modifications  Person educated: Patient Education method: Explanation, Facilities manager, and Handouts Education comprehension: verbalized understanding and returned demonstration  HOME EXERCISE PROGRAM: Access Code: [email or web address] URL: [email or web address] Date: 09/24/2022 Prepared by: Mikey Kirschner  Exercises - Supine Posterior Pelvic Tilt  - 1 x daily - 7 x weekly - 2 sets - 10 reps - Supine Lower Trunk Rotation  - 1 x daily - 7 x weekly - 1 sets - 10 reps - Supine Piriformis Stretch with Foot on Ground  - 1 x daily - 7 x weekly - 1 sets - 2 reps - 20 sec hold - Lying Prone  - 1 x daily - 7 x weekly - 3 sets - 10 reps - Supine Hamstring Stretch with Strap  - 1 x daily - 7 x weekly - 1 sets - 3 reps - 20 sec hold - Supine ITB Stretch with Strap  - 1 x daily - 7 x weekly - 1 sets - 3 reps - 20 sec hold - Supine Piriformis Stretch  - 1 x daily - 7 x weekly - 1 sets - 3 reps - 20 sec hold - Supine 90/90 Alternating Heel Touches with Posterior Pelvic Tilt  - 1 x daily - 7 x weekly - 1 sets - 20 reps - Dead Bug  - 1 x daily - 7 x weekly - 1 sets - 20 reps  ASSESSMENT:  CLINICAL IMPRESSION: Ms Margolis presents to skilled PT reporting that she is having some increased pain after having a week off to go to the beach.  Pt does continues to have a centralization of symptoms with prone positioning, but does have left hip pain during many exercises.  Patient required cuing for TA contraction and maintaining of posterior pelvic tilt during dying bug.  Patient continues to report a relief of symptoms with aquatic PT.  Patient  continues to require skilled PT to progress towards goal related activities.  OBJECTIVE IMPAIRMENTS: decreased balance, difficulty walking, decreased strength, increased muscle spasms, and pain.   ACTIVITY LIMITATIONS: standing, squatting, and stairs  PARTICIPATION LIMITATIONS: cleaning, community activity, and yard work  PERSONAL FACTORS: Past/current experiences, Time since onset of injury/illness/exacerbation, and 3+ comorbidities: L4-5 decompression and fusion on 08/26/21, osteoporosis, hypertension  are also affecting patient's functional outcome.   REHAB POTENTIAL: Good  CLINICAL DECISION MAKING: Evolving/moderate complexity  EVALUATION COMPLEXITY: Moderate   GOALS: Goals reviewed with patient? Yes  SHORT TERM GOALS: Target date: 09/30/2022 Patient will be independent with initial HEP. Baseline: Goal status: MET on 09/24/2022  2.  Patient will report at least a 30% improvement in pain and functional tasks since initial evaluation. Baseline:  Goal status: MET on 09/24/2022   LONG TERM GOALS: Target date: 10/31/2022  Patient will be independent with advanced HEP and self progression in preparation for discharge. Baseline:  Goal status: IN PROGRESS  2.  Patient will increase FOTO score to at least 61% to demonstrate improvements in functional mobilty. Baseline: 46% Goal status: INITIAL  3.  Patient will increase left hip strength to at least 5-/5 to allow for easier ability to negotiate stairs with reciprocal pattern. Baseline:  Goal status: INITIAL  4.  Patient will be able to resume daily ambulation program with pain no greater than 3/10. Baseline:  Goal status: INITIAL  5.  Patient will be able to maintain left single leg stance for at least 30 seconds with good stability to decrease risk of falling. Baseline: 24 sec Goal status: INITIAL  PLAN:  PT FREQUENCY: 2x/week  PT DURATION: 8 weeks  PLANNED INTERVENTIONS: Therapeutic exercises, Therapeutic activity,  Neuromuscular re-education, Balance training, Gait training, Patient/Family education, Self Care, Joint mobilization, Joint manipulation, Stair training, Aquatic Therapy, Dry Needling, Electrical stimulation, Spinal manipulation, Spinal mobilization, Cryotherapy, Moist heat, Taping, Traction, Ultrasound, Ionotophoresis 4mg /ml Dexamethasone, Manual therapy, and Re-evaluation  PLAN FOR NEXT SESSION: assess response to aquatic session, continue to strengthen her core avoiding nerve irritation.     Reather Laurence, PT 10/06/22 11:57 AM  [facility] [address] [place], [place] [address] Phone # [phone] Fax [phone]

## 2022-10-08 ENCOUNTER — Encounter (HOSPITAL_BASED_OUTPATIENT_CLINIC_OR_DEPARTMENT_OTHER): Payer: Self-pay | Admitting: Physical Therapy

## 2022-10-08 ENCOUNTER — Ambulatory Visit (HOSPITAL_BASED_OUTPATIENT_CLINIC_OR_DEPARTMENT_OTHER): Payer: Medicare Other | Attending: Student | Admitting: Physical Therapy

## 2022-10-08 DIAGNOSIS — M5459 Other low back pain: Secondary | ICD-10-CM | POA: Diagnosis not present

## 2022-10-08 DIAGNOSIS — M6281 Muscle weakness (generalized): Secondary | ICD-10-CM | POA: Insufficient documentation

## 2022-10-08 DIAGNOSIS — R252 Cramp and spasm: Secondary | ICD-10-CM | POA: Insufficient documentation

## 2022-10-08 DIAGNOSIS — R262 Difficulty in walking, not elsewhere classified: Secondary | ICD-10-CM | POA: Insufficient documentation

## 2022-10-08 NOTE — Therapy (Signed)
OUTPATIENT PHYSICAL THERAPY TREATMENT NOTE   Patient Name: Susan Woodward MRN: [id number] DOB:01/31/1956, 67 y.o., female Today's Date: 10/08/2022  END OF SESSION:  PT End of Session - 10/08/22 1043     Visit Number 7    Date for PT Re-Evaluation 10/31/22    Authorization Type BC/BS Medicare    Progress Note Due on Visit 10    PT Start Time 1032    PT Stop Time 1110    PT Time Calculation (min) 38 min    Activity Tolerance Patient tolerated treatment well    Behavior During Therapy [facility] for tasks assessed/performed              Past Medical History:  Diagnosis Date   Hypertension    Osteoporosis    Past Surgical History:  Procedure Laterality Date   WRIST SURGERY Right    12 years ago   Patient Active Problem List   Diagnosis Date Noted   Degenerative spondylolisthesis 08/26/2021   Spinal stenosis of lumbar region 07/13/2017   Trochanteric bursitis, right hip 10/01/2016    PCP: Soundra Pilon, FNP  REFERRING PROVIDER: Floreen Comber, NP  REFERRING DIAG: [phone] (ICD-10-CM) - Spinal stenosis, lumbar region with neurogenic claudication  THERAPY DIAG:  Difficulty in walking, not elsewhere classified  Other low back pain  Cramp and spasm  Muscle weakness (generalized)  Rationale for Evaluation and Treatment: Rehabilitation  ONSET DATE: Early December 2023  SUBJECTIVE:   SUBJECTIVE STATEMENT:  Pt reports she has felt worse since Sunday "when I cooked".   "Sitting on ball at therapy made things a little worse".   Walking to pool today increased pain to 7/10.    PERTINENT HISTORY: lumbar spondylolisthesis s/p L4-5 decompression and fusion on 08/26/2021, osteoporosis, HTN  PAIN:  Are you having pain? yes: NPRS scale: 6-7/10 Pain location:  Lt hip down LLE to ankle Pain description: it is constant, "like a tooth ache" Aggravating factors: standing and walking Relieving factors: sitting  PRECAUTIONS: None  WEIGHT BEARING RESTRICTIONS:  No  FALLS:  Has patient fallen in last 6 months? No  LIVING ENVIRONMENT: Lives with: lives with their spouse Lives in: House/apartment Stairs: Yes: External: 3 steps; none Has following equipment at home: None  OCCUPATION: retired  PLOF: Independent and Leisure: enjoys outdoor activities, walking  PATIENT GOALS: To be able to return to her 2 mile walking program  NEXT MD VISIT: Mid-March  OBJECTIVE:   DIAGNOSTIC FINDINGS:     PATIENT SURVEYS:  FOTO 46% (projected 61% by visit 12)  COGNITION: Overall cognitive status: Within functional limits for tasks assessed     SENSATION: Pt reports numbness and tingling down left leg   MUSCLE LENGTH: 09/09/2022: Hamstrings: Right  82 deg; Left 75 deg (with pain on left side)  POSTURE: No Significant postural limitations  PALPATION: Tenderness to palpation along left hip  LOWER EXTREMITY ROM:  WFL  LOWER EXTREMITY MMT:  Eval: Right LE strength is WFL Left hip strength is 4/5 and left knee is WFL  LOWER EXTREMITY SPECIAL TESTS:  Eval: Hip special tests: Luisa Hart (FABER) test: positive , SI compression test: positive , SI distraction test: positive , and Hip scouring test: positive  Slump test slight positive on left, negative right  FUNCTIONAL TESTS:  09/09/2022: 5 times sit to stand: 10.6 secs with pain of 8/10 Single leg stance:  Right- 30 sec, Left- 24 sec. Noted to nearly lose balance at multiple occasions during SLS on left LE  GAIT: Distance  walked: >500 ft Assistive device utilized: None Level of assistance: Complete Independence Comments: Pt reports ability to walk for at least 10 min before she needs to stop.  Pt reports that pain starts immediately upon starting to walk.   TODAY'S TREATMENT:  DATE: 10/08/2022  Pt seen for aquatic therapy today.  Treatment took place in water 3.5-4.75 ft in depth at the [facility] pool. Temp of water was 91.  Pt entered/exited the pool via stairs independently  with bilat rail. * at 16ft 6" side stepping 1 laps, forward/ backward walking 2 laps, arms relaxed at side -( increase in LLE symptoms regardless of direction) * relaxed squat at wall,  (symptoms subside) * noodle under arms at 4 ft with walking forward/backward 1/2 width (continued LLE symptoms) * straddling yellow noodle in deepest water:  gentle cycling, cc ski (with RUE on wall), hip abdct/addct with LE suspended; LE suspended with ankle circles, with arm horiz abdct/ addct (ok) and abdct/add (increased symptoms) ; return to cycling with breast stroke arms  * holding wall at 4 ft:  Lt hip abdct x 3,  LLE circle x 2 (increased pain to toes )  * once dried off:  at bench, Lt hip flexor stretch (sitting on Rt buttock) no pain,  and then with added Lt arm raise  over head (no pain) x 15s x 2; Lt piriformis stretch in sitting.  Pt requires the buoyancy and hydrostatic pressure of water for support, and to offload joints by unweighting joint load by at least 50 % in navel deep water and by at least 75-80% in chest to neck deep water.  Viscosity of the water is needed for resistance of strengthening. Water current perturbations provides challenge to standing balance requiring increased core activation  DATE: 10/06/2022  Nustep level 5 x6 min with PT present to discuss status Seated hamstring stretch 2x20 sec bilat Seated piriformis stretch 2x20 sec bilat Seated on green pball:  CW and CCW pelvic circles, marching, LAQ. X20 bilat Prone on elbows x2 min with some symptom resolution of LLE pain Hooklying posterior pelvic tilt (PPT) 2x10 PPT with 90/90 heel tap  2x10 PPT with dying bug 2x10 (with cuing for increased TA contraction to decrease strain on back) Supine bent knee fallouts x10   09/24/22   Prone lying x 2 min (leg symptoms eliminated) Prone on elbows attempted but after approx 1 min patient felt very mild return of symptoms. Nu step x 5 min level 5 Supine hamstring, IT band and piriformis  stretches 2 x 20 sec each PPT x 20 PPT with 90/90 heel tap x 20 PPT with dying bug x 20 Ice to lumbar spine in supine hook lying with bolster under knees x 10 min  PATIENT EDUCATION:  Education details: aquatic modifications  Person educated: Patient Education method: Programmer, multimedia, Facilities manager, and Handouts Education comprehension: verbalized understanding and returned demonstration  HOME EXERCISE PROGRAM: Access Code: [email or web address] URL: [email or web address] Date: 09/24/2022 Prepared by: Mikey Kirschner  Exercises - Supine Posterior Pelvic Tilt  - 1 x daily - 7 x weekly - 2 sets - 10 reps - Supine Lower Trunk Rotation  - 1 x daily - 7 x weekly - 1 sets - 10 reps - Supine Piriformis Stretch with Foot on Ground  - 1 x daily - 7 x weekly - 1 sets - 2 reps - 20 sec hold - Lying Prone  - 1 x daily - 7 x weekly - 3 sets - 10 reps - Supine Hamstring  Stretch with Strap  - 1 x daily - 7 x weekly - 1 sets - 3 reps - 20 sec hold - Supine ITB Stretch with Strap  - 1 x daily - 7 x weekly - 1 sets - 3 reps - 20 sec hold - Supine Piriformis Stretch  - 1 x daily - 7 x weekly - 1 sets - 3 reps - 20 sec hold - Supine 90/90 Alternating Heel Touches with Posterior Pelvic Tilt  - 1 x daily - 7 x weekly - 1 sets - 20 reps - Dead Bug  - 1 x daily - 7 x weekly - 1 sets - 20 reps  ASSESSMENT:  CLINICAL IMPRESSION: Pt with limited tolerance for gait and exercises with WB in water at 55ft6" and 67ft; increased radicular symptoms to Lt ankle.  She reports the most relief when in relaxed squat position (with hip flexed) and also suspended on noodle in deeper water (between legs) and cycling.  Radicular symptoms slowly decreased with cycling; symptoms return with LLE in neutral or slight hip ext.   Patient continues to require skilled PT to progress towards goal related activities.  OBJECTIVE IMPAIRMENTS: decreased balance, difficulty walking, decreased strength, increased muscle spasms, and pain.    ACTIVITY LIMITATIONS: standing, squatting, and stairs  PARTICIPATION LIMITATIONS: cleaning, community activity, and yard work  PERSONAL FACTORS: Past/current experiences, Time since onset of injury/illness/exacerbation, and 3+ comorbidities: L4-5 decompression and fusion on 08/26/21, osteoporosis, hypertension  are also affecting patient's functional outcome.   REHAB POTENTIAL: Good  CLINICAL DECISION MAKING: Evolving/moderate complexity  EVALUATION COMPLEXITY: Moderate   GOALS: Goals reviewed with patient? Yes  SHORT TERM GOALS: Target date: 09/30/2022 Patient will be independent with initial HEP. Baseline: Goal status: MET on 09/24/2022  2.  Patient will report at least a 30% improvement in pain and functional tasks since initial evaluation. Baseline:  Goal status: MET on 09/24/2022   LONG TERM GOALS: Target date: 10/31/2022  Patient will be independent with advanced HEP and self progression in preparation for discharge. Baseline:  Goal status: IN PROGRESS  2.  Patient will increase FOTO score to at least 61% to demonstrate improvements in functional mobilty. Baseline: 46% Goal status: INITIAL  3.  Patient will increase left hip strength to at least 5-/5 to allow for easier ability to negotiate stairs with reciprocal pattern. Baseline:  Goal status: INITIAL  4.  Patient will be able to resume daily ambulation program with pain no greater than 3/10. Baseline:  Goal status: INITIAL  5.  Patient will be able to maintain left single leg stance for at least 30 seconds with good stability to decrease risk of falling. Baseline: 24 sec Goal status: INITIAL  PLAN:  PT FREQUENCY: 2x/week  PT DURATION: 8 weeks  PLANNED INTERVENTIONS: Therapeutic exercises, Therapeutic activity, Neuromuscular re-education, Balance training, Gait training, Patient/Family education, Self Care, Joint mobilization, Joint manipulation, Stair training, Aquatic Therapy, Dry Needling, Electrical  stimulation, Spinal manipulation, Spinal mobilization, Cryotherapy, Moist heat, Taping, Traction, Ultrasound, Ionotophoresis 4mg /ml Dexamethasone, Manual therapy, and Re-evaluation  PLAN FOR NEXT SESSION: assess response to aquatic session, continue to strengthen her core avoiding nerve irritation.   Mayer Camel, PTA 10/08/22 10:59 AM [facility] [address] [place], [place], [address] Phone: [phone]   Fax:  [phone]

## 2022-10-13 ENCOUNTER — Encounter: Payer: Self-pay | Admitting: Rehabilitative and Restorative Service Providers"

## 2022-10-13 ENCOUNTER — Ambulatory Visit: Payer: Medicare Other | Admitting: Rehabilitative and Restorative Service Providers"

## 2022-10-13 DIAGNOSIS — M6281 Muscle weakness (generalized): Secondary | ICD-10-CM | POA: Diagnosis not present

## 2022-10-13 DIAGNOSIS — R262 Difficulty in walking, not elsewhere classified: Secondary | ICD-10-CM

## 2022-10-13 DIAGNOSIS — M5459 Other low back pain: Secondary | ICD-10-CM

## 2022-10-13 DIAGNOSIS — R2689 Other abnormalities of gait and mobility: Secondary | ICD-10-CM | POA: Diagnosis not present

## 2022-10-13 DIAGNOSIS — R252 Cramp and spasm: Secondary | ICD-10-CM

## 2022-10-13 NOTE — Therapy (Signed)
OUTPATIENT PHYSICAL THERAPY TREATMENT NOTE   Patient Name: Susan Woodward MRN: [id number] DOB:10/29/1955, 67 y.o., female Today's Date: 10/13/2022   Progress Note Reporting Period 09/09/2022 to 10/13/2022  See note below for Objective Data and Assessment of Progress/Goals.       END OF SESSION:  PT End of Session - 10/13/22 1103     Visit Number 8    Date for PT Re-Evaluation 10/31/22    Authorization Type BC/BS Medicare    Progress Note Due on Visit 18    PT Start Time 1102    PT Stop Time 1140    PT Time Calculation (min) 38 min    Activity Tolerance Patient tolerated treatment well    Behavior During Therapy WFL for tasks assessed/performed              Past Medical History:  Diagnosis Date   Hypertension    Osteoporosis    Past Surgical History:  Procedure Laterality Date   WRIST SURGERY Right    12 years ago   Patient Active Problem List   Diagnosis Date Noted   Degenerative spondylolisthesis 08/26/2021   Spinal stenosis of lumbar region 07/13/2017   Trochanteric bursitis, right hip 10/01/2016    PCP: Soundra Pilon, FNP  REFERRING PROVIDER: Floreen Comber, NP  REFERRING DIAG: [phone] (ICD-10-CM) - Spinal stenosis, lumbar region with neurogenic claudication  THERAPY DIAG:  Difficulty in walking, not elsewhere classified  Other low back pain  Cramp and spasm  Muscle weakness (generalized)  Other abnormalities of gait and mobility  Rationale for Evaluation and Treatment: Rehabilitation  ONSET DATE: Early December 2023  SUBJECTIVE:   SUBJECTIVE STATEMENT:  Pt reports she did some yard work over the weekend and is having some increased pain today.  Pt reports that she goes back to Dr Lindalou Hose office on Thursday for follow up.   PERTINENT HISTORY: lumbar spondylolisthesis s/p L4-5 decompression and fusion on 08/26/2021, osteoporosis, HTN  PAIN:  Are you having pain? yes: NPRS scale: 6-7/10 Pain location:  Lt hip down LLE to  ankle Pain description: it is constant, "like a tooth ache" Aggravating factors: standing and walking Relieving factors: sitting  PRECAUTIONS: None  WEIGHT BEARING RESTRICTIONS: No  FALLS:  Has patient fallen in last 6 months? No  LIVING ENVIRONMENT: Lives with: lives with their spouse Lives in: House/apartment Stairs: Yes: External: 3 steps; none Has following equipment at home: None  OCCUPATION: retired  PLOF: Independent and Leisure: enjoys outdoor activities, walking  PATIENT GOALS: To be able to return to her 2 mile walking program  NEXT MD VISIT: Mid-March  OBJECTIVE:   DIAGNOSTIC FINDINGS:     PATIENT SURVEYS:  Eval:  FOTO 46% (projected 61% by visit 12) 10/13/2022:  FOTO 42%  COGNITION: Overall cognitive status: Within functional limits for tasks assessed     SENSATION: Pt reports numbness and tingling down left leg   MUSCLE LENGTH: 09/09/2022: Hamstrings: Right  82 deg; Left 75 deg (with pain on left side)  POSTURE: No Significant postural limitations  PALPATION: Tenderness to palpation along left hip  LOWER EXTREMITY ROM:  WFL  LOWER EXTREMITY MMT:  Eval: Right LE strength is WFL Left hip strength is 4/5 and left knee is WFL  LOWER EXTREMITY SPECIAL TESTS:  Eval: Hip special tests: Luisa Hart (FABER) test: positive , SI compression test: positive , SI distraction test: positive , and Hip scouring test: positive  Slump test slight positive on left, negative right  FUNCTIONAL TESTS:  09/09/2022:  5 times sit to stand: 10.6 secs with pain of 8/10 Single leg stance:  Right- 30 sec, Left- 24 sec. Noted to nearly lose balance at multiple occasions during SLS on left LE  10/13/2022: 5 times sit to stand: 8.55 sec without any reports of pain  GAIT: Distance walked: >500 ft Assistive device utilized: None Level of assistance: Complete Independence Comments: Pt reports ability to walk for at least 10 min before she needs to stop.  Pt reports that  pain starts immediately upon starting to walk.   TODAY'S TREATMENT:   DATE: 10/13/2022  Decompression Series:  Decompression pose, cervical retraction, shoulder press, leg lengthening.  2x10 each Supine TA contraction with pushing through thighs 2x10 Nustep level 5 x6 min with PT present to discuss status Sit to/from stand 2x5 Trigger Point Dry-Needling  Treatment instructions: Expect mild to moderate muscle soreness. S/S of pneumothorax if dry needled over a lung field, and to seek immediate medical attention should they occur. Patient verbalized understanding of these instructions and education. Patient Consent Given: Yes Education handout provided: Yes Muscles treated: bilateral lumbar multifidi Electrical stimulation performed: No Parameters: N/A Treatment response/outcome: Utilized skilled palpation using bony landmarks to identify trigger points.  Able to illicit twitch response and muscle elongation. Manual Therapy:  soft tissue mobilization to bilateral lumbar paraspinals to further promote tissue elongation.   DATE: 10/08/2022  Pt seen for aquatic therapy today.  Treatment took place in water 3.5-4.75 ft in depth at the [facility] pool. Temp of water was 91.  Pt entered/exited the pool via stairs independently with bilat rail. * at 84ft 6" side stepping 1 laps, forward/ backward walking 2 laps, arms relaxed at side -( increase in LLE symptoms regardless of direction) * relaxed squat at wall,  (symptoms subside) * noodle under arms at 4 ft with walking forward/backward 1/2 width (continued LLE symptoms) * straddling yellow noodle in deepest water:  gentle cycling, cc ski (with RUE on wall), hip abdct/addct with LE suspended; LE suspended with ankle circles, with arm horiz abdct/ addct (ok) and abdct/add (increased symptoms) ; return to cycling with breast stroke arms  * holding wall at 4 ft:  Lt hip abdct x 3,  LLE circle x 2 (increased pain to toes )  * once dried off:   at bench, Lt hip flexor stretch (sitting on Rt buttock) no pain,  and then with added Lt arm raise  over head (no pain) x 15s x 2; Lt piriformis stretch in sitting.  Pt requires the buoyancy and hydrostatic pressure of water for support, and to offload joints by unweighting joint load by at least 50 % in navel deep water and by at least 75-80% in chest to neck deep water.  Viscosity of the water is needed for resistance of strengthening. Water current perturbations provides challenge to standing balance requiring increased core activation  DATE: 10/06/2022  Nustep level 5 x6 min with PT present to discuss status Seated hamstring stretch 2x20 sec bilat Seated piriformis stretch 2x20 sec bilat Seated on green pball:  CW and CCW pelvic circles, marching, LAQ. X20 bilat Prone on elbows x2 min with some symptom resolution of LLE pain Hooklying posterior pelvic tilt (PPT) 2x10 PPT with 90/90 heel tap  2x10 PPT with dying bug 2x10 (with cuing for increased TA contraction to decrease strain on back) Supine bent knee fallouts x10    PATIENT EDUCATION:  Education details: aquatic modifications  Person educated: Patient Education method: Programmer, multimedia, Facilities manager, and Handouts Education  comprehension: verbalized understanding and returned demonstration  HOME EXERCISE PROGRAM: Access Code: [email or web address] URL: [email or web address] Date: 09/24/2022 Prepared by: Mikey Kirschner  Exercises - Supine Posterior Pelvic Tilt  - 1 x daily - 7 x weekly - 2 sets - 10 reps - Supine Lower Trunk Rotation  - 1 x daily - 7 x weekly - 1 sets - 10 reps - Supine Piriformis Stretch with Foot on Ground  - 1 x daily - 7 x weekly - 1 sets - 2 reps - 20 sec hold - Lying Prone  - 1 x daily - 7 x weekly - 3 sets - 10 reps - Supine Hamstring Stretch with Strap  - 1 x daily - 7 x weekly - 1 sets - 3 reps - 20 sec hold - Supine ITB Stretch with Strap  - 1 x daily - 7 x weekly - 1 sets - 3 reps - 20 sec hold -  Supine Piriformis Stretch  - 1 x daily - 7 x weekly - 1 sets - 3 reps - 20 sec hold - Supine 90/90 Alternating Heel Touches with Posterior Pelvic Tilt  - 1 x daily - 7 x weekly - 1 sets - 20 reps - Dead Bug  - 1 x daily - 7 x weekly - 1 sets - 20 reps  ASSESSMENT:  CLINICAL IMPRESSION: Susan Woodward presents to skilled PT with continued reports of increased pain.  Pt with decreased FOTO score, but did have improved 5 times sit to stand with decreased pain noted.  Patient does admit that she has noted improved strengthening overall, but states her main complaint is of pain.  Patient to see Dr Lindalou Hose office on Thursday and is hoping that she can get a back and hip MRI ordered.  Patient continues to progress towards goal related activities.  OBJECTIVE IMPAIRMENTS: decreased balance, difficulty walking, decreased strength, increased muscle spasms, and pain.   ACTIVITY LIMITATIONS: standing, squatting, and stairs  PARTICIPATION LIMITATIONS: cleaning, community activity, and yard work  PERSONAL FACTORS: Past/current experiences, Time since onset of injury/illness/exacerbation, and 3+ comorbidities: L4-5 decompression and fusion on 08/26/21, osteoporosis, hypertension  are also affecting patient's functional outcome.   REHAB POTENTIAL: Good  CLINICAL DECISION MAKING: Evolving/moderate complexity  EVALUATION COMPLEXITY: Moderate   GOALS: Goals reviewed with patient? Yes  SHORT TERM GOALS: Target date: 09/30/2022 Patient will be independent with initial HEP. Baseline: Goal status: MET on 09/24/2022  2.  Patient will report at least a 30% improvement in pain and functional tasks since initial evaluation. Baseline:  Goal status: MET on 09/24/2022   LONG TERM GOALS: Target date: 10/31/2022  Patient will be independent with advanced HEP and self progression in preparation for discharge. Baseline:  Goal status: IN PROGRESS  2.  Patient will increase FOTO score to at least 61% to demonstrate  improvements in functional mobilty. Baseline: 46% Goal status: IN PROGRESS  3.  Patient will increase left hip strength to at least 5-/5 to allow for easier ability to negotiate stairs with reciprocal pattern. Baseline:  Goal status: IN PROGRESS  4.  Patient will be able to resume daily ambulation program with pain no greater than 3/10. Baseline:  Goal status: IN PROGRESS  5.  Patient will be able to maintain left single leg stance for at least 30 seconds with good stability to decrease risk of falling. Baseline: 24 sec Goal status: INITIAL  PLAN:  PT FREQUENCY: 2x/week  PT DURATION: 8 weeks  PLANNED INTERVENTIONS: Therapeutic exercises, Therapeutic activity, Neuromuscular re-education, Balance  training, Gait training, Patient/Family education, Self Care, Joint mobilization, Joint manipulation, Stair training, Aquatic Therapy, Dry Needling, Electrical stimulation, Spinal manipulation, Spinal mobilization, Cryotherapy, Moist heat, Taping, Traction, Ultrasound, Ionotophoresis 4mg /ml Dexamethasone, Manual therapy, and Re-evaluation  PLAN FOR NEXT SESSION: assess response to aquatic session, continue to strengthen her core avoiding nerve irritation.   Reather Laurence, PT 10/13/22 11:46 AM  [facility] [address], [address] [place], [place] [address] Phone # [phone] Fax [phone]

## 2022-10-13 NOTE — Patient Instructions (Signed)

## 2022-10-15 ENCOUNTER — Encounter (HOSPITAL_BASED_OUTPATIENT_CLINIC_OR_DEPARTMENT_OTHER): Payer: Self-pay | Admitting: Physical Therapy

## 2022-10-15 ENCOUNTER — Ambulatory Visit (HOSPITAL_BASED_OUTPATIENT_CLINIC_OR_DEPARTMENT_OTHER): Payer: Medicare Other | Admitting: Physical Therapy

## 2022-10-15 DIAGNOSIS — R252 Cramp and spasm: Secondary | ICD-10-CM | POA: Diagnosis not present

## 2022-10-15 DIAGNOSIS — M6281 Muscle weakness (generalized): Secondary | ICD-10-CM

## 2022-10-15 DIAGNOSIS — M5459 Other low back pain: Secondary | ICD-10-CM

## 2022-10-15 DIAGNOSIS — R262 Difficulty in walking, not elsewhere classified: Secondary | ICD-10-CM | POA: Diagnosis not present
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
## 2022-10-16 DIAGNOSIS — M48062 Spinal stenosis, lumbar region with neurogenic claudication: Secondary | ICD-10-CM | POA: Diagnosis not present

## 2022-10-16 DIAGNOSIS — R1032 Left lower quadrant pain: Secondary | ICD-10-CM | POA: Diagnosis not present

## 2022-10-20 ENCOUNTER — Encounter: Payer: Medicare Other | Admitting: Rehabilitative and Restorative Service Providers"

## 2022-10-22 ENCOUNTER — Ambulatory Visit: Payer: Medicare Other | Admitting: Physical Therapy
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
## 2022-10-23 ENCOUNTER — Ambulatory Visit: Payer: Medicare Other

## 2022-10-23 DIAGNOSIS — R2689 Other abnormalities of gait and mobility: Secondary | ICD-10-CM

## 2022-10-23 DIAGNOSIS — R252 Cramp and spasm: Secondary | ICD-10-CM | POA: Diagnosis not present

## 2022-10-23 DIAGNOSIS — R262 Difficulty in walking, not elsewhere classified: Secondary | ICD-10-CM

## 2022-10-23 DIAGNOSIS — M6281 Muscle weakness (generalized): Secondary | ICD-10-CM | POA: Diagnosis not present

## 2022-10-23 DIAGNOSIS — M5459 Other low back pain: Secondary | ICD-10-CM

## 2022-10-23 NOTE — Therapy (Signed)
OUTPATIENT PHYSICAL THERAPY TREATMENT NOTE   Patient Name: Susan Woodward MRN: [id number] DOB:1956-04-03, 67 y.o., female Today's Date: 10/23/2022   END OF SESSION:  PT End of Session - 10/23/22 1231     Visit Number 10    Date for PT Re-Evaluation 10/31/22    Authorization Type BC/BS Medicare    Progress Note Due on Visit 18    PT Start Time 1232    PT Stop Time 1310    PT Time Calculation (min) 38 min    Activity Tolerance Patient tolerated treatment well    Behavior During Therapy [facility] for tasks assessed/performed              Past Medical History:  Diagnosis Date   Hypertension    Osteoporosis    Past Surgical History:  Procedure Laterality Date   WRIST SURGERY Right    12 years ago   Patient Active Problem List   Diagnosis Date Noted   Degenerative spondylolisthesis 08/26/2021   Spinal stenosis of lumbar region 07/13/2017   Trochanteric bursitis, right hip 10/01/2016    PCP: Soundra Pilon, FNP  REFERRING PROVIDER: Floreen Comber, NP  REFERRING DIAG: [phone] (ICD-10-CM) - Spinal stenosis, lumbar region with neurogenic claudication  THERAPY DIAG:  Difficulty in walking, not elsewhere classified  Other low back pain  Cramp and spasm  Muscle weakness (generalized)  Other abnormalities of gait and mobility  Rationale for Evaluation and Treatment: Rehabilitation  ONSET DATE: Early December 2023  SUBJECTIVE:   SUBJECTIVE STATEMENT:  Pt reports "no improvement".  MD will be scheduling MRI soon.  MD would like for patient to continue PT for now in the event she requires surgery on her left hip, as this will help prepare her for surgery.    PERTINENT HISTORY: lumbar spondylolisthesis s/p L4-5 decompression and fusion on 08/26/2021, osteoporosis, HTN  PAIN:  Are you having pain? yes: NPRS scale: 3/10 Pain location:  Lt hip down LLE to ankle Pain description: it is constant, "like a tooth ache" Aggravating factors: standing and  walking Relieving factors: sitting  PRECAUTIONS: None  WEIGHT BEARING RESTRICTIONS: No  FALLS:  Has patient fallen in last 6 months? No  LIVING ENVIRONMENT: Lives with: lives with their spouse Lives in: House/apartment Stairs: Yes: External: 3 steps; none Has following equipment at home: None  OCCUPATION: retired  PLOF: Independent and Leisure: enjoys outdoor activities, walking  PATIENT GOALS: To be able to return to her 2 mile walking program  NEXT MD VISIT: Mid-March  OBJECTIVE:   DIAGNOSTIC FINDINGS:     PATIENT SURVEYS:  Eval:  FOTO 46% (projected 61% by visit 12) 10/13/2022:  FOTO 42%  COGNITION: Overall cognitive status: Within functional limits for tasks assessed     SENSATION: Pt reports numbness and tingling down left leg   MUSCLE LENGTH: 09/09/2022: Hamstrings: Right  82 deg; Left 75 deg (with pain on left side)  POSTURE: No Significant postural limitations  PALPATION: Tenderness to palpation along left hip  LOWER EXTREMITY ROM:  WFL  LOWER EXTREMITY MMT:  Eval: Right LE strength is WFL Left hip strength is 4/5 and left knee is WFL  LOWER EXTREMITY SPECIAL TESTS:  Eval: Hip special tests: Luisa Hart (FABER) test: positive , SI compression test: positive , SI distraction test: positive , and Hip scouring test: positive  Slump test slight positive on left, negative right  FUNCTIONAL TESTS:  09/09/2022: 5 times sit to stand: 10.6 secs with pain of 8/10 Single leg stance:  Right-  30 sec, Left- 24 sec. Noted to nearly lose balance at multiple occasions during SLS on left LE  10/13/2022: 5 times sit to stand: 8.55 sec without any reports of pain  GAIT: Distance walked: >500 ft Assistive device utilized: None Level of assistance: Complete Independence Comments: Pt reports ability to walk for at least 10 min before she needs to stop.  Pt reports that pain starts immediately upon starting to walk.   TODAY'S TREATMENT:  DATE: 10/23/2022   Nustep x 5 min level 3 (PT present throughout to discuss progress and status) Supine heel slides x 20 with towel under heel Supine hip abduction x 20 with towel under heel Supine hamstring stretch 3 x 30 sec left Supine IT band stretch 3 x 30 sec left Supine hip IR's stretch 3 x 30 sec left Supine hip adductor stretch 3 x 30 sec left Seated piriformis stretch 3 x 30 sec left Suggested for home pain control, "heat for stiffness, ice for pain"  DATE: 10/15/2022  Pt seen for aquatic therapy today.  Treatment took place in water 3.5-4.75 ft in depth at the [facility] pool. Temp of water was 91.  Pt entered/exited the pool via stairs independently with bilat rail.  * at bench prior to entering pool, Lt hip flexor stretch (sitting on Rt buttock) - pain in Lt buttocks * straddling yellow noodle in deepest water:  gentle cycling, cc ski (with RUE on wall), hip abdct/addct with LE suspended * supported supine (noodle behind back, under arms/ at waist/ at ankles/ nekdoodle) for decompression of spine: alternating hip ext with glute squeeze and DF (like leg lengthener from decompression land series) x 10 each; bilat hip abdct/addct (sliding legs over ankle noodle);  bad ragaz with Long leg distraction, alternating LEs;  assisted knee to chest with gentle push off with utilizing UE/core to return to wall;  ankle circles;  manual stretch to R/L ankle DF muscles when cramping   DATE: 10/13/2022  Decompression Series:  Decompression pose, cervical retraction, shoulder press, leg lengthening.  2x10 each Supine TA contraction with pushing through thighs 2x10 Nustep level 5 x6 min with PT present to discuss status Sit to/from stand 2x5 Trigger Point Dry-Needling  Treatment instructions: Expect mild to moderate muscle soreness. S/S of pneumothorax if dry needled over a lung field, and to seek immediate medical attention should they occur. Patient verbalized understanding of these instructions and  education. Patient Consent Given: Yes Education handout provided: Yes Muscles treated: bilateral lumbar multifidi Electrical stimulation performed: No Parameters: N/A Treatment response/outcome: Utilized skilled palpation using bony landmarks to identify trigger points.  Able to illicit twitch response and muscle elongation. Manual Therapy:  soft tissue mobilization to bilateral lumbar paraspinals to further promote tissue elongation.   DATE: 10/08/2022  Pt seen for aquatic therapy today.  Treatment took place in water 3.5-4.75 ft in depth at the [facility] pool. Temp of water was 91.  Pt entered/exited the pool via stairs independently with bilat rail. * at 28ft 6" side stepping 1 laps, forward/ backward walking 2 laps, arms relaxed at side -( increase in LLE symptoms regardless of direction) * relaxed squat at wall,  (symptoms subside) * noodle under arms at 4 ft with walking forward/backward 1/2 width (continued LLE symptoms) * straddling yellow noodle in deepest water:  gentle cycling, cc ski (with RUE on wall), hip abdct/addct with LE suspended; LE suspended with ankle circles, with arm horiz abdct/ addct (ok) and abdct/add (increased symptoms) ; return to cycling  with breast stroke arms  * holding wall at 4 ft:  Lt hip abdct x 3,  LLE circle x 2 (increased pain to toes )  * once dried off:  at bench, Lt hip flexor stretch (sitting on Rt buttock) no pain,  and then with added Lt arm raise  over head (no pain) x 15s x 2; Lt piriformis stretch in sitting.  Pt requires the buoyancy and hydrostatic pressure of water for support, and to offload joints by unweighting joint load by at least 50 % in navel deep water and by at least 75-80% in chest to neck deep water.  Viscosity of the water is needed for resistance of strengthening. Water current perturbations provides challenge to standing balance requiring increased core activation   PATIENT EDUCATION:  Education details: aquatic  modifications  Person educated: Patient Education method: Programmer, multimedia, Demonstration, and Handouts Education comprehension: verbalized understanding and returned demonstration  HOME EXERCISE PROGRAM: Access Code: [email or web address] URL: [email or web address] Date: 09/24/2022 Prepared by: Mikey Kirschner  Exercises - Supine Posterior Pelvic Tilt  - 1 x daily - 7 x weekly - 2 sets - 10 reps - Supine Lower Trunk Rotation  - 1 x daily - 7 x weekly - 1 sets - 10 reps - Supine Piriformis Stretch with Foot on Ground  - 1 x daily - 7 x weekly - 1 sets - 2 reps - 20 sec hold - Lying Prone  - 1 x daily - 7 x weekly - 3 sets - 10 reps - Supine Hamstring Stretch with Strap  - 1 x daily - 7 x weekly - 1 sets - 3 reps - 20 sec hold - Supine ITB Stretch with Strap  - 1 x daily - 7 x weekly - 1 sets - 3 reps - 20 sec hold - Supine Piriformis Stretch  - 1 x daily - 7 x weekly - 1 sets - 3 reps - 20 sec hold - Supine 90/90 Alternating Heel Touches with Posterior Pelvic Tilt  - 1 x daily - 7 x weekly - 1 sets - 20 reps - Dead Bug  - 1 x daily - 7 x weekly - 1 sets - 20 reps  ASSESSMENT:  CLINICAL IMPRESSION: Omia is having left hip issues that are interfering with her lumbar rehab.  She has been approved for MRI and will be scheduling this soon.  She responded well to focused left hip stretching and ice but did have some tingling in the left foot when stretching left hip adductors.  We modified this and the tingling resolved.  We will proceed based on MRI findings but emphasized to patient that flexibility would be key to being prepared in the event she needs hip surgery.    OBJECTIVE IMPAIRMENTS: decreased balance, difficulty walking, decreased strength, increased muscle spasms, and pain.   ACTIVITY LIMITATIONS: standing, squatting, and stairs  PARTICIPATION LIMITATIONS: cleaning, community activity, and yard work  PERSONAL FACTORS: Past/current experiences, Time since onset of  injury/illness/exacerbation, and 3+ comorbidities: L4-5 decompression and fusion on 08/26/21, osteoporosis, hypertension  are also affecting patient's functional outcome.   REHAB POTENTIAL: Good  CLINICAL DECISION MAKING: Evolving/moderate complexity  EVALUATION COMPLEXITY: Moderate   GOALS: Goals reviewed with patient? Yes  SHORT TERM GOALS: Target date: 09/30/2022 Patient will be independent with initial HEP. Baseline: Goal status: MET on 09/24/2022  2.  Patient will report at least a 30% improvement in pain and functional tasks since initial evaluation. Baseline:  Goal status: MET on 09/24/2022   LONG  TERM GOALS: Target date: 10/31/2022  Patient will be independent with advanced HEP and self progression in preparation for discharge. Baseline:  Goal status: IN PROGRESS  2.  Patient will increase FOTO score to at least 61% to demonstrate improvements in functional mobilty. Baseline: 46% Goal status: IN PROGRESS  3.  Patient will increase left hip strength to at least 5-/5 to allow for easier ability to negotiate stairs with reciprocal pattern. Baseline:  Goal status: IN PROGRESS  4.  Patient will be able to resume daily ambulation program with pain no greater than 3/10. Baseline:  Goal status: IN PROGRESS  5.  Patient will be able to maintain left single leg stance for at least 30 seconds with good stability to decrease risk of falling. Baseline: 24 sec Goal status: INITIAL  PLAN:  PT FREQUENCY: 2x/week  PT DURATION: 8 weeks  PLANNED INTERVENTIONS: Therapeutic exercises, Therapeutic activity, Neuromuscular re-education, Balance training, Gait training, Patient/Family education, Self Care, Joint mobilization, Joint manipulation, Stair training, Aquatic Therapy, Dry Needling, Electrical stimulation, Spinal manipulation, Spinal mobilization, Cryotherapy, Moist heat, Taping, Traction, Ultrasound, Ionotophoresis 4mg /ml Dexamethasone, Manual therapy, and Re-evaluation  PLAN  FOR NEXT SESSION: assess response to aquatic session, continue to strengthen her core avoiding nerve irritation, hip flexibility exercises.   Victorino Dike B. Dedee Liss, PT 10/23/22 6:35 PM  [facility] [address], [address] [place], [place] [address] Phone # [phone] Fax [phone]

## 2022-10-27 ENCOUNTER — Ambulatory Visit: Payer: Medicare Other | Attending: Student | Admitting: Rehabilitative and Restorative Service Providers"

## 2022-10-27 ENCOUNTER — Encounter: Payer: Self-pay | Admitting: Rehabilitative and Restorative Service Providers"

## 2022-10-27 DIAGNOSIS — M5459 Other low back pain: Secondary | ICD-10-CM | POA: Diagnosis not present

## 2022-10-27 DIAGNOSIS — R2689 Other abnormalities of gait and mobility: Secondary | ICD-10-CM | POA: Insufficient documentation

## 2022-10-27 DIAGNOSIS — R262 Difficulty in walking, not elsewhere classified: Secondary | ICD-10-CM | POA: Insufficient documentation

## 2022-10-27 DIAGNOSIS — M6281 Muscle weakness (generalized): Secondary | ICD-10-CM | POA: Diagnosis not present

## 2022-10-27 DIAGNOSIS — R252 Cramp and spasm: Secondary | ICD-10-CM | POA: Diagnosis not present

## 2022-10-27 NOTE — Therapy (Signed)
OUTPATIENT PHYSICAL THERAPY TREATMENT NOTE   Patient Name: Susan Woodward MRN: [id number] DOB:08/16/1955, 67 y.o., female Today's Date: 10/27/2022   END OF SESSION:  PT End of Session - 10/27/22 1021     Visit Number 11    Date for PT Re-Evaluation 10/31/22    Authorization Type BC/BS Medicare    Progress Note Due on Visit 18    PT Start Time 1015    PT Stop Time 1055    PT Time Calculation (min) 40 min    Activity Tolerance Patient tolerated treatment well    Behavior During Therapy [facility] for tasks assessed/performed              Past Medical History:  Diagnosis Date   Hypertension    Osteoporosis    Past Surgical History:  Procedure Laterality Date   WRIST SURGERY Right    12 years ago   Patient Active Problem List   Diagnosis Date Noted   Degenerative spondylolisthesis 08/26/2021   Spinal stenosis of lumbar region 07/13/2017   Trochanteric bursitis, right hip 10/01/2016    PCP: Soundra Pilon, FNP  REFERRING PROVIDER: Floreen Comber, NP  REFERRING DIAG: [phone] (ICD-10-CM) - Spinal stenosis, lumbar region with neurogenic claudication  THERAPY DIAG:  Difficulty in walking, not elsewhere classified  Other low back pain  Cramp and spasm  Muscle weakness (generalized)  Other abnormalities of gait and mobility  Rationale for Evaluation and Treatment: Rehabilitation  ONSET DATE: Early December 2023  SUBJECTIVE:   SUBJECTIVE STATEMENT:  Patient reports that she has her MRI scheduled for her back and hip next week.  States that she rested this weekend and is feeling some better today.   PERTINENT HISTORY: lumbar spondylolisthesis s/p L4-5 decompression and fusion on 08/26/2021, osteoporosis, HTN  PAIN:  Are you having pain? yes: NPRS scale: 3/10 Pain location:  Lt hip down LLE to ankle Pain description: it is constant, "like a tooth ache" Aggravating factors: standing and walking Relieving factors: sitting  PRECAUTIONS: None  WEIGHT  BEARING RESTRICTIONS: No  FALLS:  Has patient fallen in last 6 months? No  LIVING ENVIRONMENT: Lives with: lives with their spouse Lives in: House/apartment Stairs: Yes: External: 3 steps; none Has following equipment at home: None  OCCUPATION: retired  PLOF: Independent and Leisure: enjoys outdoor activities, walking  PATIENT GOALS: To be able to return to her 2 mile walking program  NEXT MD VISIT: Mid-March  OBJECTIVE:   DIAGNOSTIC FINDINGS:     PATIENT SURVEYS:  Eval:  FOTO 46% (projected 61% by visit 12) 10/13/2022:  FOTO 42% 10/27/2022:  FOTO 51%  COGNITION: Overall cognitive status: Within functional limits for tasks assessed     SENSATION: Pt reports numbness and tingling down left leg   MUSCLE LENGTH: 09/09/2022: Hamstrings: Right  82 deg; Left 75 deg (with pain on left side)  POSTURE: No Significant postural limitations  PALPATION: Tenderness to palpation along left hip  LOWER EXTREMITY ROM:  WFL  LOWER EXTREMITY MMT:  Eval: Right LE strength is WFL Left hip strength is 4/5 and left knee is [facility]  10/27/2022: Left hip strength of 5-/5  LOWER EXTREMITY SPECIAL TESTS:  Eval: Hip special tests: Luisa Hart (FABER) test: positive , SI compression test: positive , SI distraction test: positive , and Hip scouring test: positive  Slump test slight positive on left, negative right  FUNCTIONAL TESTS:  09/09/2022: 5 times sit to stand: 10.6 secs with pain of 8/10 Single leg stance:  Right- 30 sec,  Left- 24 sec. Noted to nearly lose balance at multiple occasions during SLS on left LE  10/13/2022: 5 times sit to stand: 8.55 sec without any reports of pain  10/27/2022: Single leg stance:  Right 30 sec, left 30 sec  GAIT: Distance walked: >500 ft Assistive device utilized: None Level of assistance: Complete Independence Comments: Pt reports ability to walk for at least 10 min before she needs to stop.  Pt reports that pain starts immediately upon starting to  walk.   TODAY'S TREATMENT:   DATE: 10/27/2022  Nustep level 5 x6 min with PT present to discuss status Seated piriformis stretch 2x20 sec bilat Supine heel slides and hip abduction with foot on slider x20 each bilat Supine hamstring stretch with strap 2x20 sec bilat Supine hip adductor and IT band with strap 2x20 sec each bilat Supine hip IR's stretch 2x20 sec bilat Supine posterior pelvic tilt 2x10 Supine heel tap down 2x10 bilat Supine dead bug with red pball 2x10 Supine UE/LE red pball pass 2x10   DATE: 10/23/2022  Nustep x 5 min level 3 (PT present throughout to discuss progress and status) Supine heel slides x 20 with towel under heel Supine hip abduction x 20 with towel under heel Supine hamstring stretch 3 x 30 sec left Supine IT band stretch 3 x 30 sec left Supine hip IR's stretch 3 x 30 sec left Supine hip adductor stretch 3 x 30 sec left Seated piriformis stretch 3 x 30 sec left Suggested for home pain control, "heat for stiffness, ice for pain"  DATE: 10/15/2022  Pt seen for aquatic therapy today.  Treatment took place in water 3.5-4.75 ft in depth at the [facility] pool. Temp of water was 91.  Pt entered/exited the pool via stairs independently with bilat rail.  * at bench prior to entering pool, Lt hip flexor stretch (sitting on Rt buttock) - pain in Lt buttocks * straddling yellow noodle in deepest water:  gentle cycling, cc ski (with RUE on wall), hip abdct/addct with LE suspended * supported supine (noodle behind back, under arms/ at waist/ at ankles/ nekdoodle) for decompression of spine: alternating hip ext with glute squeeze and DF (like leg lengthener from decompression land series) x 10 each; bilat hip abdct/addct (sliding legs over ankle noodle);  bad ragaz with Long leg distraction, alternating LEs;  assisted knee to chest with gentle push off with utilizing UE/core to return to wall;  ankle circles;  manual stretch to R/L ankle DF muscles when  cramping     PATIENT EDUCATION:  Education details: aquatic modifications  Person educated: Patient Education method: Programmer, multimedia, Facilities manager, and Handouts Education comprehension: verbalized understanding and returned demonstration  HOME EXERCISE PROGRAM: Access Code: [email or web address] URL: [email or web address] Date: 09/24/2022 Prepared by: Mikey Kirschner  Exercises - Supine Posterior Pelvic Tilt  - 1 x daily - 7 x weekly - 2 sets - 10 reps - Supine Lower Trunk Rotation  - 1 x daily - 7 x weekly - 1 sets - 10 reps - Supine Piriformis Stretch with Foot on Ground  - 1 x daily - 7 x weekly - 1 sets - 2 reps - 20 sec hold - Lying Prone  - 1 x daily - 7 x weekly - 3 sets - 10 reps - Supine Hamstring Stretch with Strap  - 1 x daily - 7 x weekly - 1 sets - 3 reps - 20 sec hold - Supine ITB Stretch with Strap  - 1 x daily - 7 x  weekly - 1 sets - 3 reps - 20 sec hold - Supine Piriformis Stretch  - 1 x daily - 7 x weekly - 1 sets - 3 reps - 20 sec hold - Supine 90/90 Alternating Heel Touches with Posterior Pelvic Tilt  - 1 x daily - 7 x weekly - 1 sets - 20 reps - Dead Bug  - 1 x daily - 7 x weekly - 1 sets - 20 reps  ASSESSMENT:  CLINICAL IMPRESSION: Ms Misher presents to skilled PT stating that she wants to go to her last scheduled aquatics PT session and then hold on further services until after she obtains the MRI results.  Patient has improved with her left hip strength and her single leg stance on left side.  Patient has improved on FOTO score since initial evaluation.  Patient continues to report pain with walking.  OBJECTIVE IMPAIRMENTS: decreased balance, difficulty walking, decreased strength, increased muscle spasms, and pain.   ACTIVITY LIMITATIONS: standing, squatting, and stairs  PARTICIPATION LIMITATIONS: cleaning, community activity, and yard work  PERSONAL FACTORS: Past/current experiences, Time since onset of injury/illness/exacerbation, and 3+ comorbidities:  L4-5 decompression and fusion on 08/26/21, osteoporosis, hypertension  are also affecting patient's functional outcome.   REHAB POTENTIAL: Good  CLINICAL DECISION MAKING: Evolving/moderate complexity  EVALUATION COMPLEXITY: Moderate   GOALS: Goals reviewed with patient? Yes  SHORT TERM GOALS: Target date: 09/30/2022 Patient will be independent with initial HEP. Baseline: Goal status: MET on 09/24/2022  2.  Patient will report at least a 30% improvement in pain and functional tasks since initial evaluation. Baseline:  Goal status: MET on 09/24/2022   LONG TERM GOALS: Target date: 10/31/2022  Patient will be independent with advanced HEP and self progression in preparation for discharge. Baseline:  Goal status: MET  2.  Patient will increase FOTO score to at least 61% to demonstrate improvements in functional mobilty. Baseline: 46% Goal status: IN PROGRESS  3.  Patient will increase left hip strength to at least 5-/5 to allow for easier ability to negotiate stairs with reciprocal pattern. Baseline:  Goal status: MET met on 10/27/2022  4.  Patient will be able to resume daily ambulation program with pain no greater than 3/10. Baseline:  Goal status: IN PROGRESS  5.  Patient will be able to maintain left single leg stance for at least 30 seconds with good stability to decrease risk of falling. Baseline: 24 sec Goal status: MET on 10/27/2022  PLAN:  PT FREQUENCY: 2x/week  PT DURATION: 8 weeks  PLANNED INTERVENTIONS: Therapeutic exercises, Therapeutic activity, Neuromuscular re-education, Balance training, Gait training, Patient/Family education, Self Care, Joint mobilization, Joint manipulation, Stair training, Aquatic Therapy, Dry Needling, Electrical stimulation, Spinal manipulation, Spinal mobilization, Cryotherapy, Moist heat, Taping, Traction, Ultrasound, Ionotophoresis 4mg /ml Dexamethasone, Manual therapy, and Re-evaluation  PLAN FOR NEXT SESSION: possible discharge vs place  hold pending imaging at next pool session   Reather Laurence, PT 10/27/22 11:13 AM   [facility] [address], [address] [place], [place] [address] Phone # [phone] Fax [phone]

## 2022-10-29 ENCOUNTER — Ambulatory Visit: Payer: Medicare Other | Admitting: Physical Therapy

## 2022-10-29 ENCOUNTER — Encounter: Payer: Self-pay | Admitting: Physical Therapy

## 2022-10-29 DIAGNOSIS — M5459 Other low back pain: Secondary | ICD-10-CM | POA: Diagnosis not present

## 2022-10-29 DIAGNOSIS — R2689 Other abnormalities of gait and mobility: Secondary | ICD-10-CM

## 2022-10-29 DIAGNOSIS — M6281 Muscle weakness (generalized): Secondary | ICD-10-CM

## 2022-10-29 DIAGNOSIS — R262 Difficulty in walking, not elsewhere classified: Secondary | ICD-10-CM | POA: Diagnosis not present

## 2022-10-29 DIAGNOSIS — R252 Cramp and spasm: Secondary | ICD-10-CM | POA: Diagnosis not present

## 2022-10-29 NOTE — Therapy (Addendum)
OUTPATIENT PHYSICAL THERAPY TREATMENT NOTE AND LATE ENTRY DISCHARGE SUMMARY   Patient Name: Susan Woodward MRN: [id number] DOB:1956/04/12, 67 y.o., female Today's Date: 10/29/2022   END OF SESSION:  PT End of Session - 10/29/22 1041     Visit Number 12    Date for PT Re-Evaluation 10/31/22    Authorization Type BC/BS Medicare    Progress Note Due on Visit 18    PT Start Time 1041    PT Stop Time 1130    PT Time Calculation (min) 49 min    Activity Tolerance Patient tolerated treatment well    Behavior During Therapy [facility] for tasks assessed/performed              Past Medical History:  Diagnosis Date   Hypertension    Osteoporosis    Past Surgical History:  Procedure Laterality Date   WRIST SURGERY Right    12 years ago   Patient Active Problem List   Diagnosis Date Noted   Degenerative spondylolisthesis 08/26/2021   Spinal stenosis of lumbar region 07/13/2017   Trochanteric bursitis, right hip 10/01/2016    PCP: Soundra Pilon, FNP  REFERRING PROVIDER: Floreen Comber, NP  REFERRING DIAG: [phone] (ICD-10-CM) - Spinal stenosis, lumbar region with neurogenic claudication  THERAPY DIAG:  Difficulty in walking, not elsewhere classified  Other low back pain  Cramp and spasm  Muscle weakness (generalized)  Other abnormalities of gait and mobility  Rationale for Evaluation and Treatment: Rehabilitation  ONSET DATE: Early December 2023  SUBJECTIVE:   SUBJECTIVE STATEMENT:  Pt MRI show significant Lt hip OA. Pt will have back MRI on the 10th to see where she may need a surgical intervention: back or hip. She feels getting her core and back stronger has definitely helped.   PERTINENT HISTORY: lumbar spondylolisthesis s/p L4-5 decompression and fusion on 08/26/2021, osteoporosis, HTN  PAIN:  Are you having pain? yes: NPRS scale: 3/10 Pain location:  Lt hip down LLE to ankle Pain description: it is constant, "like a tooth ache" Aggravating factors:  standing and walking Relieving factors: sitting  PRECAUTIONS: None  WEIGHT BEARING RESTRICTIONS: No  FALLS:  Has patient fallen in last 6 months? No  LIVING ENVIRONMENT: Lives with: lives with their spouse Lives in: House/apartment Stairs: Yes: External: 3 steps; none Has following equipment at home: None  OCCUPATION: retired  PLOF: Independent and Leisure: enjoys outdoor activities, walking  PATIENT GOALS: To be able to return to her 2 mile walking program  NEXT MD VISIT: Mid-March  OBJECTIVE:   DIAGNOSTIC FINDINGS:     PATIENT SURVEYS:  Eval:  FOTO 46% (projected 61% by visit 12) 10/13/2022:  FOTO 42% 10/27/2022:  FOTO 51%  COGNITION: Overall cognitive status: Within functional limits for tasks assessed     SENSATION: Pt reports numbness and tingling down left leg   MUSCLE LENGTH: 09/09/2022: Hamstrings: Right  82 deg; Left 75 deg (with pain on left side)  POSTURE: No Significant postural limitations  PALPATION: Tenderness to palpation along left hip  LOWER EXTREMITY ROM:  WFL  LOWER EXTREMITY MMT:  Eval: Right LE strength is WFL Left hip strength is 4/5 and left knee is [facility]  10/27/2022: Left hip strength of 5-/5  LOWER EXTREMITY SPECIAL TESTS:  Eval: Hip special tests: Luisa Hart (FABER) test: positive , SI compression test: positive , SI distraction test: positive , and Hip scouring test: positive  Slump test slight positive on left, negative right  FUNCTIONAL TESTS:  09/09/2022: 5 times sit  to stand: 10.6 secs with pain of 8/10 Single leg stance:  Right- 30 sec, Left- 24 sec. Noted to nearly lose balance at multiple occasions during SLS on left LE  10/13/2022: 5 times sit to stand: 8.55 sec without any reports of pain  10/27/2022: Single leg stance:  Right 30 sec, left 30 sec  GAIT: Distance walked: >500 ft Assistive device utilized: None Level of assistance: Complete Independence Comments: Pt reports ability to walk for at least 10 min  before she needs to stop.  Pt reports that pain starts immediately upon starting to walk.   TODAY'S TREATMENT:   10/29/22:Pt arrives for aquatic physical therapy. Treatment took place in 3.5-5.5 feet of water. Water temperature was 91 degrees F. Pt entered the pool via with Bil hands rails, step to step slowly. Pt requires buoyancy of water for support and to offload joints with strengthening exercises.   Seated water bench with 75% submersion Pt performed seated LE AROM exercises 20x in all planes, with concurrent discussion of current status and MRI results. Added ankle fins for LAQ resistance 2x10. Semi-sit on bench with 1 multi colored UE for lat/core press 3x10, VC to contract glutes more.  Vertical decompression hang 2 min with large noodle: LE movements including bicycle 2 min, scissor legs 10x then a 2 min rest and repeat for second set. Standing kickboard push/pull 15x, VC to contract glutes/core. Ended session with 3 min seated decompression with yellow noodle.   DATE: 10/27/2022  Nustep level 5 x6 min with PT present to discuss status Seated piriformis stretch 2x20 sec bilat Supine heel slides and hip abduction with foot on slider x20 each bilat Supine hamstring stretch with strap 2x20 sec bilat Supine hip adductor and IT band with strap 2x20 sec each bilat Supine hip IR's stretch 2x20 sec bilat Supine posterior pelvic tilt 2x10 Supine heel tap down 2x10 bilat Supine dead bug with red pball 2x10 Supine UE/LE red pball pass 2x10   DATE: 10/23/2022  Nustep x 5 min level 3 (PT present throughout to discuss progress and status) Supine heel slides x 20 with towel under heel Supine hip abduction x 20 with towel under heel Supine hamstring stretch 3 x 30 sec left Supine IT band stretch 3 x 30 sec left Supine hip IR's stretch 3 x 30 sec left Supine hip adductor stretch 3 x 30 sec left Seated piriformis stretch 3 x 30 sec left Suggested for home pain control, "heat for stiffness, ice  for pain"  DATE: 10/15/2022  Pt seen for aquatic therapy today.  Treatment took place in water 3.5-4.75 ft in depth at the [facility] pool. Temp of water was 91.  Pt entered/exited the pool via stairs independently with bilat rail.  * at bench prior to entering pool, Lt hip flexor stretch (sitting on Rt buttock) - pain in Lt buttocks * straddling yellow noodle in deepest water:  gentle cycling, cc ski (with RUE on wall), hip abdct/addct with LE suspended * supported supine (noodle behind back, under arms/ at waist/ at ankles/ nekdoodle) for decompression of spine: alternating hip ext with glute squeeze and DF (like leg lengthener from decompression land series) x 10 each; bilat hip abdct/addct (sliding legs over ankle noodle);  bad ragaz with Long leg distraction, alternating LEs;  assisted knee to chest with gentle push off with utilizing UE/core to return to wall;  ankle circles;  manual stretch to R/L ankle DF muscles when cramping     PATIENT EDUCATION:  Education  details: aquatic modifications  Person educated: Patient Education method: Explanation, Demonstration, and Handouts Education comprehension: verbalized understanding and returned demonstration  HOME EXERCISE PROGRAM: Access Code: [email or web address] URL: [email or web address] Date: 09/24/2022 Prepared by: Mikey Kirschner  Exercises - Supine Posterior Pelvic Tilt  - 1 x daily - 7 x weekly - 2 sets - 10 reps - Supine Lower Trunk Rotation  - 1 x daily - 7 x weekly - 1 sets - 10 reps - Supine Piriformis Stretch with Foot on Ground  - 1 x daily - 7 x weekly - 1 sets - 2 reps - 20 sec hold - Lying Prone  - 1 x daily - 7 x weekly - 3 sets - 10 reps - Supine Hamstring Stretch with Strap  - 1 x daily - 7 x weekly - 1 sets - 3 reps - 20 sec hold - Supine ITB Stretch with Strap  - 1 x daily - 7 x weekly - 1 sets - 3 reps - 20 sec hold - Supine Piriformis Stretch  - 1 x daily - 7 x weekly - 1 sets - 3 reps - 20 sec  hold - Supine 90/90 Alternating Heel Touches with Posterior Pelvic Tilt  - 1 x daily - 7 x weekly - 1 sets - 20 reps - Dead Bug  - 1 x daily - 7 x weekly - 1 sets - 20 reps  ASSESSMENT:  CLINICAL IMPRESSION: Pt arrived for aquatic PT treatment with little LTLE pain. Pt reports her ankle symptoms are less and she feels stronger in her core and back. Her biggest concern is her hip and hip pain. She is open to discussing hip replacement with orthopedic surgeon. She responded very well to vertical, non-weightbearing exercises in the pool today, essentially no LTLE symptoms.  OBJECTIVE IMPAIRMENTS: decreased balance, difficulty walking, decreased strength, increased muscle spasms, and pain.   ACTIVITY LIMITATIONS: standing, squatting, and stairs  PARTICIPATION LIMITATIONS: cleaning, community activity, and yard work  PERSONAL FACTORS: Past/current experiences, Time since onset of injury/illness/exacerbation, and 3+ comorbidities: L4-5 decompression and fusion on 08/26/21, osteoporosis, hypertension  are also affecting patient's functional outcome.   REHAB POTENTIAL: Good  CLINICAL DECISION MAKING: Evolving/moderate complexity  EVALUATION COMPLEXITY: Moderate   GOALS: Goals reviewed with patient? Yes  SHORT TERM GOALS: Target date: 09/30/2022 Patient will be independent with initial HEP. Baseline: Goal status: MET on 09/24/2022  2.  Patient will report at least a 30% improvement in pain and functional tasks since initial evaluation. Baseline:  Goal status: MET on 09/24/2022   LONG TERM GOALS: Target date: 10/31/2022  Patient will be independent with advanced HEP and self progression in preparation for discharge. Baseline:  Goal status: MET  2.  Patient will increase FOTO score to at least 61% to demonstrate improvements in functional mobilty. Baseline: 46% Goal status: IN PROGRESS  3.  Patient will increase left hip strength to at least 5-/5 to allow for easier ability to negotiate  stairs with reciprocal pattern. Baseline:  Goal status: MET met on 10/27/2022  4.  Patient will be able to resume daily ambulation program with pain no greater than 3/10. Baseline:  Goal status: IN PROGRESS  5.  Patient will be able to maintain left single leg stance for at least 30 seconds with good stability to decrease risk of falling. Baseline: 24 sec Goal status: MET on 10/27/2022  PLAN:  PT FREQUENCY: 2x/week  PT DURATION: 8 weeks  PLANNED INTERVENTIONS: Therapeutic exercises, Therapeutic activity, Neuromuscular re-education, Balance training, Gait training,  Patient/Family education, Self Care, Joint mobilization, Joint manipulation, Stair training, Aquatic Therapy, Dry Needling, Electrical stimulation, Spinal manipulation, Spinal mobilization, Cryotherapy, Moist heat, Taping, Traction, Ultrasound, Ionotophoresis 4mg /ml Dexamethasone, Manual therapy, and Re-evaluation  PLAN FOR NEXT SESSION: Pt put on hold as she does not have any further appts scheduled. She will have MRI done on her back on the 10th and will discuss options with her doctor when they get the results. DC if we do not hear back from her 1-2 weeks after the 10th.   Ane Payment, PTA 10/29/22 9:05 PM   [facility] [address], [address] [place], [place] [address] Phone # [phone] Fax [phone]   PHYSICAL THERAPY DISCHARGE SUMMARY  As of 11/17/2022, patient has not returned for further visits.  Patient followed up with MD following results from MRI and has received a referral to Dr Magnus Ivan for further work up.  Patient discharged at this time to continue follow up with MD for further assessment.  Patient agrees to discharge. Patient goals were partially met. Patient is being discharged due to not returning since the last visit.  Shaunda Menke, PT 11/17/22 11:09 AM

## 2022-11-05 DIAGNOSIS — M48062 Spinal stenosis, lumbar region with neurogenic claudication: Secondary | ICD-10-CM | POA: Diagnosis not present

## 2022-11-05 DIAGNOSIS — M4316 Spondylolisthesis, lumbar region: Secondary | ICD-10-CM | POA: Diagnosis not present

## 2022-11-05 DIAGNOSIS — R1032 Left lower quadrant pain: Secondary | ICD-10-CM | POA: Diagnosis not present

## 2022-11-05 DIAGNOSIS — M4317 Spondylolisthesis, lumbosacral region: Secondary | ICD-10-CM | POA: Diagnosis not present

## 2022-11-05 DIAGNOSIS — M545 Low back pain, unspecified: Secondary | ICD-10-CM | POA: Diagnosis not present

## 2022-11-05 DIAGNOSIS — M1612 Unilateral primary osteoarthritis, left hip: Secondary | ICD-10-CM | POA: Diagnosis not present

## 2022-11-06 DIAGNOSIS — M810 Age-related osteoporosis without current pathological fracture: Secondary | ICD-10-CM | POA: Diagnosis not present

## 2022-11-06 DIAGNOSIS — Z1231 Encounter for screening mammogram for malignant neoplasm of breast: Secondary | ICD-10-CM | POA: Diagnosis not present

## 2022-11-06 DIAGNOSIS — Z6824 Body mass index (BMI) 24.0-24.9, adult: Secondary | ICD-10-CM | POA: Diagnosis not present

## 2022-11-06 DIAGNOSIS — I10 Essential (primary) hypertension: Secondary | ICD-10-CM | POA: Diagnosis not present

## 2022-11-06 DIAGNOSIS — M459 Ankylosing spondylitis of unspecified sites in spine: Secondary | ICD-10-CM | POA: Diagnosis not present

## 2022-11-13 DIAGNOSIS — S73192A Other sprain of left hip, initial encounter: Secondary | ICD-10-CM | POA: Diagnosis not present

## 2022-11-13 DIAGNOSIS — M160 Bilateral primary osteoarthritis of hip: Secondary | ICD-10-CM | POA: Diagnosis not present

## 2022-11-13 DIAGNOSIS — S73191A Other sprain of right hip, initial encounter: Secondary | ICD-10-CM | POA: Diagnosis not present

## 2022-11-27 ENCOUNTER — Ambulatory Visit: Payer: Medicare Other | Admitting: Orthopaedic Surgery

## 2022-11-27 DIAGNOSIS — M1612 Unilateral primary osteoarthritis, left hip: Secondary | ICD-10-CM | POA: Diagnosis not present

## 2022-11-27 DIAGNOSIS — M25552 Pain in left hip: Secondary | ICD-10-CM | POA: Diagnosis not present

## 2022-11-27 NOTE — Progress Notes (Signed)
The patient is a 67 year old female that I am seeing for the first time.  She is sent to me due to debilitating pain in her left hip and known arthritis of the left hip.  She has had extensive spine surgery and that is done well for her but she does have left hip and groin pain has been getting worse for some time now.  She is very active as well.  I was able to review all notes and medications within epic.  She deals with hip pain on a daily basis and it is definitely affecting her mobility, her quality of life and her actives daily living.  She has tried anti-inflammatories as well as activity modification.  She is a thin individual.  She is work on strengthening her hip as well.  On exam she has significant pain in the groin with internal and external rotation of the left hip.  It is definitely more stiff than the right side.  An AP pelvis and lateral of her left hip are reviewed as well as an MRI on the canopy system.  She has areas of full-thickness cartilage loss of the weightbearing surface of her left hip.  There are degenerative labral tear and osteophytes around the hip.  There is also cystic changes and subchondral changes in terms of edema.  This is consistent with end-stage arthritis of the left hip.  We had a long and thorough discussion about hip replacement surgery.  We discussed the risks and benefits of the surgery and what to expect from an intraoperative and postoperative course.  I gave her handout about hip replacement surgery and went over all of her imaging studies with her.  All questions and concerns were answered and addressed.  We will work on getting this scheduled in the near future and she agrees with this as well.  We will be in touch.

## 2022-12-01 ENCOUNTER — Ambulatory Visit: Payer: Medicare Other | Admitting: Orthopaedic Surgery

## 2022-12-10 ENCOUNTER — Other Ambulatory Visit: Payer: Self-pay

## 2022-12-10 DIAGNOSIS — H2513 Age-related nuclear cataract, bilateral: Secondary | ICD-10-CM | POA: Diagnosis not present

## 2022-12-10 DIAGNOSIS — H40033 Anatomical narrow angle, bilateral: Secondary | ICD-10-CM | POA: Diagnosis not present

## 2022-12-24 ENCOUNTER — Encounter (HOSPITAL_COMMUNITY): Payer: Self-pay

## 2022-12-25 NOTE — Progress Notes (Addendum)
COVID Vaccine received:  []  No [x]  Yes Date of any COVID positive Test in last 35 days:none  PCP - Peri Maris, FNP  at  [facility]  [phone] Cardiologist - None  Chest x-ray -  EKG -  (08-22-2021  Epic)  will repeat at PST Stress Test -  ECHO -  Cardiac Cath -   PCR screen: [x]  Ordered & Completed           []   No Order but Needs PROFEND           []   N/A for this surgery  Surgery Plan:  []  Ambulatory                            [x]  Outpatient in bed                            []  Admit  Anesthesia:    []  General  [x]  Spinal                           []   Choice []   MAC  Pacemaker / ICD device [x]  No []  Yes   Spinal Cord Stimulator:[x]  No []  Yes       History of Sleep Apnea? [x]  No []  Yes   CPAP used?- [x]  No []  Yes    Does the patient monitor blood sugar?          []  No []  Yes  [x]  N/A  Patient has: [x]  NO Hx DM   []  Pre-DM                 []  DM1  []   DM2  Blood Thinner / Instructions:  None Aspirin Instructions:  None  ERAS Protocol Ordered: []  No  [x]  Yes PRE-SURGERY [x]  ENSURE  []  G2  Patient is to be NPO after: 10:00 am  Patient was given the 5 CHG shower / bath instructions for THA / TKA / Total or Reverse Shoulder arthroplasty surgery along with 2 bottles of the CHG soap. Patient will start this on:  Monday  December 29, 2022 (same day as PST interview)  All questions were asked and answered, Patient voiced understanding of this process.   Activity level: Patient is able to climb a flight of stairs without difficulty; [x]  No CP  [x]  No SOB, but would have Leg pain.  Patient can perform ADLs without assistance.   Anesthesia review: HTN  Patient denies shortness of breath, fever, cough and chest pain at PAT appointment.  Patient verbalized understanding and agreement to the Pre-Surgical Instructions that were given to them at this PAT appointment. Patient was also educated of the need to review these PAT instructions again prior to her surgery.I reviewed  the appropriate phone numbers to call if they have any and questions or concerns.

## 2022-12-25 NOTE — Patient Instructions (Addendum)
SURGICAL WAITING ROOM VISITATION Patients having surgery or a procedure may have no more than 2 support people in the waiting area - these visitors may rotate in the visitor waiting room.   Due to an increase in RSV and influenza rates and associated hospitalizations, children ages 61 and under may not visit patients in [facility] hospitals. If the patient needs to stay at the hospital during part of their recovery, the visitor guidelines for inpatient rooms apply.  PRE-OP VISITATION  Pre-op nurse will coordinate an appropriate time for 1 support person to accompany the patient in pre-op.  This support person may not rotate.  This visitor will be contacted when the time is appropriate for the visitor to come back in the pre-op area.  Please refer to the [facility] website for the visitor guidelines for Inpatients (after your surgery is over and you are in a regular room).  You are not required to quarantine at this time prior to your surgery. However, you must do this: Hand Hygiene often Do NOT share personal items Notify your provider if you are in close contact with someone who has COVID or you develop fever 100.4 or greater, new onset of sneezing, cough, sore throat, shortness of breath or body aches.  If you test positive for Covid or have been in contact with anyone that has tested positive in the last 10 days please notify you surgeon.    Your procedure is scheduled on:  Friday  January 02, 2023  Report to [facility] Main Entrance: [place] entrance where the [employer] is available.   Report to admitting at:   10:30  AM  +++++Call this number if you have any questions or problems the morning of surgery [phone]  Do not eat food after Midnight the night prior to your surgery/procedure.  After Midnight you may have the following liquids until   10:00  AM DAY OF SURGERY  Clear Liquid Diet Water Black Coffee (sugar ok, NO MILK/CREAM OR CREAMERS)  Tea (sugar ok, NO  MILK/CREAM OR CREAMERS) regular and decaf                             Plain Jell-O  with no fruit (NO RED)                                           Fruit ices (not with fruit pulp, NO RED)                                     Popsicles (NO RED)                                                                  Juice: apple, WHITE grape, WHITE cranberry Sports drinks like Gatorade or Powerade (NO RED)                    The day of surgery:  Drink ONE (1) Pre-Surgery Clear Ensure at 10:00  AM the morning of surgery. Drink in  one sitting. Do not sip.  This drink was given to you during your hospital pre-op appointment visit. Nothing else to drink after completing the Pre-Surgery Clear Ensure  : No candy, chewing gum or throat lozenges.    FOLLOW  ANY ADDITIONAL PRE OP INSTRUCTIONS YOU RECEIVED FROM YOUR SURGEON'S OFFICE!!!   Oral Hygiene is also important to reduce your risk of infection.        Remember - BRUSH YOUR TEETH THE MORNING OF SURGERY WITH YOUR REGULAR TOOTHPASTE   Take ONLY these medicines the morning of surgery with A SIP OF WATER: omeprazole (Prilosec)   You may not have any metal on your body including hair pins, jewelry, and body piercing  Do not wear make-up, lotions, powders, perfumes  or deodorant  Do not wear nail polish including gel and S&S, artificial / acrylic nails, or any other type of covering on natural nails including finger and toenails. If you have artificial nails, gel coating, etc., that needs to be removed by a nail salon, Please have this removed prior to surgery. Not doing so may mean that your surgery could be cancelled or delayed if the Surgeon or anesthesia staff feels like they are unable to monitor you safely.   Do not shave 48 hours prior to surgery to avoid nicks in your skin which may contribute to postoperative infections.   You may bring a small overnight bag with you on the day of surgery, only pack items that are not valuable. [facility]  IS NOT RESPONSIBLE   FOR VALUABLES THAT ARE LOST OR STOLEN.   Do not bring your home medications to the hospital. The Pharmacy will dispense medications listed on your medication list to you during your admission in the Hospital.  Please read over the following fact sheets you were given: IF YOU HAVE QUESTIONS ABOUT YOUR PRE-OP INSTRUCTIONS, PLEASE CALL [phone].        Pre-operative 5 CHG Bath Instructions   You can play a key role in reducing the risk of infection after surgery. Your skin needs to be as free of germs as possible. You can reduce the number of germs on your skin by washing with CHG (chlorhexidine gluconate) soap before surgery. CHG is an antiseptic soap that kills germs and continues to kill germs even after washing.   DO NOT use if you have an allergy to chlorhexidine/CHG or antibacterial soaps. If your skin becomes reddened or irritated, stop using the CHG and notify one of our RNs at [phone]  Please shower with the CHG soap starting 4 days before surgery using the following schedule: START SHOWERS ON [facility] 12-29-2022  Please keep in mind the following:  DO NOT shave, including legs and underarms, starting the day of your first shower.   You may shave your face at any point before/day of surgery.   Place clean sheets on your bed the day you start using CHG soap. Use a clean washcloth (not used since being washed) for each shower. DO NOT sleep with pets once you start using the CHG.   CHG Shower Instructions:  If you choose to wash your hair and private area, wash first with your normal shampoo/soap.  After you use shampoo/soap, rinse your hair and body thoroughly to remove shampoo/soap residue.  Turn the water OFF and apply about 3 tablespoons (45 ml) of CHG soap to a CLEAN  washcloth.  Apply CHG soap ONLY FROM YOUR NECK DOWN TO YOUR TOES (washing for 3-5 minutes)  DO NOT use CHG soap on face, private areas, open wounds, or sores.  Pay special attention to the area where your surgery is being performed.  If you are having back surgery, having someone wash your back for you may be helpful.  Wait 2 minutes after CHG soap is applied, then you may rinse off the CHG soap.  Pat dry with a clean towel  Put on clean clothes/pajamas   If you choose to wear lotion, please use ONLY the CHG-compatible lotions on the back of this paper.     Additional instructions for the day of surgery: DO NOT APPLY any lotions, deodorants, cologne, or perfumes.   Put on clean/comfortable clothes.  Brush your teeth.  Ask your nurse before applying any prescription medications to the skin.      CHG Compatible Lotions   Aveeno Moisturizing lotion  Cetaphil Moisturizing Cream  Cetaphil Moisturizing Lotion  Clairol Herbal Essence Moisturizing Lotion, Dry Skin  Clairol Herbal Essence Moisturizing Lotion, Extra Dry Skin  Clairol Herbal Essence Moisturizing Lotion, Normal Skin  Curel Age Defying Therapeutic Moisturizing Lotion with Alpha Hydroxy  Curel Extreme Care Body Lotion  Curel Soothing Hands Moisturizing Hand Lotion  Curel Therapeutic Moisturizing Cream, Fragrance-Free  Curel Therapeutic Moisturizing Lotion, Fragrance-Free  Curel Therapeutic Moisturizing Lotion, Original Formula  Eucerin Daily Replenishing Lotion  Eucerin Dry Skin Therapy Plus Alpha Hydroxy Crme  Eucerin Dry Skin Therapy Plus Alpha Hydroxy Lotion  Eucerin Original Crme  Eucerin Original Lotion  Eucerin Plus Crme Eucerin Plus Lotion  Eucerin TriLipid Replenishing Lotion  Keri Anti-Bacterial Hand Lotion  Keri Deep Conditioning Original Lotion Dry Skin Formula Softly Scented  Keri Deep Conditioning Original Lotion, Fragrance Free Sensitive Skin Formula  Keri Lotion Fast Absorbing Fragrance Free Sensitive  Skin Formula  Keri Lotion Fast Absorbing Softly Scented Dry Skin Formula  Keri Original Lotion  Keri Skin Renewal Lotion Keri Silky Smooth Lotion  Keri Silky Smooth Sensitive Skin Lotion  Nivea Body Creamy Conditioning Oil  Nivea Body Extra Enriched Lotion  Nivea Body Original Lotion  Nivea Body Sheer Moisturizing Lotion Nivea Crme  Nivea Skin Firming Lotion  NutraDerm 30 Skin Lotion  NutraDerm Skin Lotion  NutraDerm Therapeutic Skin Cream  NutraDerm Therapeutic Skin Lotion  ProShield Protective Hand Cream  Provon moisturizing lotion  FAILURE TO FOLLOW THESE INSTRUCTIONS MAY RESULT IN THE CANCELLATION OF YOUR SURGERY  PATIENT SIGNATURE_________________________________  NURSE SIGNATURE__________________________________  ________________________________________________________________________        Susan Woodward    An incentive spirometer is a tool that can help keep your lungs clear and active. This tool measures how well you are filling your lungs with each breath. Taking  long deep breaths may help reverse or decrease the chance of developing breathing (pulmonary) problems (especially infection) following: A long period of time when you are unable to move or be active. BEFORE THE PROCEDURE  If the spirometer includes an indicator to show your best effort, your nurse or respiratory therapist will set it to a desired goal. If possible, sit up straight or lean slightly forward. Try not to slouch. Hold the incentive spirometer in an upright position. INSTRUCTIONS FOR USE  Sit on the edge of your bed if possible, or sit up as far as you can in bed or on a chair. Hold the incentive spirometer in an upright position. Breathe out normally. Place the mouthpiece in your mouth and seal your lips tightly around it. Breathe in slowly and as deeply as possible, raising the piston or the ball toward the top of the column. Hold your breath for 3-5 seconds or for as long as  possible. Allow the piston or ball to fall to the bottom of the column. Remove the mouthpiece from your mouth and breathe out normally. Rest for a few seconds and repeat Steps 1 through 7 at least 10 times every 1-2 hours when you are awake. Take your time and take a few normal breaths between deep breaths. The spirometer may include an indicator to show your best effort. Use the indicator as a goal to work toward during each repetition. After each set of 10 deep breaths, practice coughing to be sure your lungs are clear. If you have an incision (the cut made at the time of surgery), support your incision when coughing by placing a pillow or rolled up towels firmly against it. Once you are able to get out of bed, walk around indoors and cough well. You may stop using the incentive spirometer when instructed by your caregiver.  RISKS AND COMPLICATIONS Take your time so you do not get dizzy or light-headed. If you are in pain, you may need to take or ask for pain medication before doing incentive spirometry. It is harder to take a deep breath if you are having pain. AFTER USE Rest and breathe slowly and easily. It can be helpful to keep track of a log of your progress. Your caregiver can provide you with a simple table to help with this. If you are using the spirometer at home, follow these instructions: SEEK MEDICAL CARE IF:  You are having difficultly using the spirometer. You have trouble using the spirometer as often as instructed. Your pain medication is not giving enough relief while using the spirometer. You develop fever of 100.5 F (38.1 C) or higher.                                                                                                    SEEK IMMEDIATE MEDICAL CARE IF:  You cough up bloody sputum that had not been present before. You develop fever of 102 F (38.9 C) or greater. You develop worsening pain at or near the incision site. MAKE SURE YOU:  Understand these  instructions. Will watch your condition.  Will get help right away if you are not doing well or get worse. Document Released: 11/24/2006 Document Revised: 10/06/2011 Document Reviewed: 01/25/2007 [facility] Patient Information 2014 [place], [place].       WHAT IS A BLOOD TRANSFUSION? Blood Transfusion Information  A transfusion is the replacement of blood or some of its parts. Blood is made up of multiple cells which provide different functions. Red blood cells carry oxygen and are used for blood loss replacement. White blood cells fight against infection. Platelets control bleeding. Plasma helps clot blood. Other blood products are available for specialized needs, such as hemophilia or other clotting disorders. BEFORE THE TRANSFUSION  Who gives blood for transfusions?  Healthy volunteers who are fully evaluated to make sure their blood is safe. This is blood bank blood. Transfusion therapy is the safest it has ever been in the practice of medicine. Before blood is taken from a donor, a complete history is taken to make sure that person has no history of diseases nor engages in risky social behavior (examples are intravenous drug use or sexual activity with multiple partners). The donor's travel history is screened to minimize risk of transmitting infections, such as malaria. The donated blood is tested for signs of infectious diseases, such as HIV and hepatitis. The blood is then tested to be sure it is compatible with you in order to minimize the chance of a transfusion reaction. If you or a relative donates blood, this is often done in anticipation of surgery and is not appropriate for emergency situations. It takes many days to process the donated blood. RISKS AND COMPLICATIONS Although transfusion therapy is very safe and saves many lives, the main dangers of transfusion include:  Getting an infectious disease. Developing a transfusion reaction. This is an allergic reaction to something in  the blood you were given. Every precaution is taken to prevent this. The decision to have a blood transfusion has been considered carefully by your caregiver before blood is given. Blood is not given unless the benefits outweigh the risks. AFTER THE TRANSFUSION Right after receiving a blood transfusion, you will usually feel much better and more energetic. This is especially true if your red blood cells have gotten low (anemic). The transfusion raises the level of the red blood cells which carry oxygen, and this usually causes an energy increase. The nurse administering the transfusion will monitor you carefully for complications. HOME CARE INSTRUCTIONS  No special instructions are needed after a transfusion. You may find your energy is better. Speak with your caregiver about any limitations on activity for underlying diseases you may have. SEEK MEDICAL CARE IF:  Your condition is not improving after your transfusion. You develop redness or irritation at the intravenous (IV) site. SEEK IMMEDIATE MEDICAL CARE IF:  Any of the following symptoms occur over the next 12 hours: Shaking chills. You have a temperature by mouth above 102 F (38.9 C), not controlled by medicine. Chest, back, or muscle pain. People around you feel you are not acting correctly or are confused. Shortness of breath or difficulty breathing. Dizziness and fainting. You get a rash or develop hives. You have a decrease in urine output. Your urine turns a dark color or changes to pink, red, or brown. Any of the following symptoms occur over the next 10 days: You have a temperature by mouth above 102 F (38.9 C), not controlled by medicine. Shortness of breath. Weakness after normal activity. The white part of the eye turns yellow (jaundice). You have a  decrease in the amount of urine or are urinating less often. Your urine turns a dark color or changes to pink, red, or brown. Document Released: 07/11/2000 Document  Revised: 10/06/2011 Document Reviewed: 02/28/2008 [facility] Patient Information 2014 [place], [place].       _______________________________________________________________________

## 2022-12-29 ENCOUNTER — Other Ambulatory Visit: Payer: Self-pay

## 2022-12-29 ENCOUNTER — Encounter (HOSPITAL_COMMUNITY): Payer: Self-pay

## 2022-12-29 ENCOUNTER — Encounter (HOSPITAL_COMMUNITY)
Admission: RE | Admit: 2022-12-29 | Discharge: 2022-12-29 | Disposition: A | Discharge status: Home / Self Care | Payer: Medicare Other | Source: Ambulatory Visit | Attending: Orthopaedic Surgery | Admitting: Orthopaedic Surgery

## 2022-12-29 VITALS — BP 140/90 | HR 72 | Temp 98.1°F | Resp 16 | Ht 59.0 in | Wt 118.0 lb

## 2022-12-29 DIAGNOSIS — M1612 Unilateral primary osteoarthritis, left hip: Secondary | ICD-10-CM | POA: Diagnosis not present

## 2022-12-29 DIAGNOSIS — I10 Essential (primary) hypertension: Secondary | ICD-10-CM | POA: Insufficient documentation

## 2022-12-29 DIAGNOSIS — Z01818 Encounter for other preprocedural examination: Secondary | ICD-10-CM | POA: Insufficient documentation

## 2022-12-29 HISTORY — DX: Gastro-esophageal reflux disease without esophagitis: K21.9

## 2022-12-29 HISTORY — DX: Nausea with vomiting, unspecified: R11.2

## 2022-12-29 HISTORY — DX: Other specified postprocedural states: Z98.890

## 2022-12-29 LAB — SURGICAL PCR SCREEN
MRSA, PCR: NEGATIVE
Staphylococcus aureus: NEGATIVE

## 2022-12-29 LAB — CBC
HCT: 39.9 % (ref 36.0–46.0)
Hemoglobin: 12.6 g/dL (ref 12.0–15.0)
MCH: 30.7 pg (ref 26.0–34.0)
MCHC: 31.6 g/dL (ref 30.0–36.0)
MCV: 97.1 fL (ref 80.0–100.0)
Platelets: 339 10*3/uL (ref 150–400)
RBC: 4.11 MIL/uL (ref 3.87–5.11)
RDW: 13.1 % (ref 11.5–15.5)
WBC: 6.9 10*3/uL (ref 4.0–10.5)
nRBC: 0 % (ref 0.0–0.2)

## 2022-12-29 LAB — COMPREHENSIVE METABOLIC PANEL
ALT: 16 U/L (ref 0–44)
AST: 17 U/L (ref 15–41)
Albumin: 3.9 g/dL (ref 3.5–5.0)
Alkaline Phosphatase: 26 U/L — ABNORMAL LOW (ref 38–126)
Anion gap: 8 (ref 5–15)
BUN: 14 mg/dL (ref 8–23)
CO2: 26 mmol/L (ref 22–32)
Calcium: 8.8 mg/dL — ABNORMAL LOW (ref 8.9–10.3)
Chloride: 103 mmol/L (ref 98–111)
Creatinine, Ser: 0.8 mg/dL (ref 0.44–1.00)
GFR, Estimated: >60 mL/min (ref >60)
Glucose, Bld: 77 mg/dL (ref 70–99)
Potassium: 4.4 mmol/L (ref 3.5–5.1)
Sodium: 137 mmol/L (ref 135–145)
Total Bilirubin: 0.5 mg/dL (ref 0.3–1.2)
Total Protein: 7.4 g/dL (ref 6.5–8.1)

## 2022-12-29 LAB — TYPE AND SCREEN
ABO/RH(D): A POS
Antibody Screen: NEGATIVE

## 2023-01-01 NOTE — H&P (Signed)
TOTAL HIP ADMISSION H&P  Patient is admitted for left total hip arthroplasty.  Subjective:  Chief Complaint: left hip pain  HPI: Susan Woodward, 67 y.o. female, has a history of pain and functional disability in the left hip(s) due to arthritis and patient has failed non-surgical conservative treatments for greater than 12 weeks to include NSAID's and/or analgesics, flexibility and strengthening excercises, use of assistive devices, and activity modification.  Onset of symptoms was gradual starting 4 years ago with gradually worsening course since that time.The patient noted no past surgery on the left hip(s).  Patient currently rates pain in the left hip at 10 out of 10 with activity. Patient has night pain, worsening of pain with activity and weight bearing, trendelenberg gait, pain that interfers with activities of daily living, and pain with passive range of motion. Patient has evidence of subchondral cysts, subchondral sclerosis, periarticular osteophytes, and joint space narrowing by imaging studies. This condition presents safety issues increasing the risk of falls.  There is no current active infection.  Patient Active Problem List   Diagnosis Date Noted   Unilateral primary osteoarthritis, left hip 11/27/2022   Degenerative spondylolisthesis 08/26/2021   Spinal stenosis of lumbar region 07/13/2017   Trochanteric bursitis, right hip 10/01/2016   Past Medical History:  Diagnosis Date   GERD (gastroesophageal reflux disease)    Hypertension    Osteoporosis    PONV (postoperative nausea and vomiting)     Past Surgical History:  Procedure Laterality Date   BACK SURGERY  08/16/2020   L4-5 fusion by Dr. Lelon Perla   WRIST SURGERY Right    12 years ago    No current facility-administered medications for this encounter.   Current Outpatient Medications  Medication Sig Dispense Refill Last Dose   acetaminophen (TYLENOL) 650 MG CR tablet Take 1,300 mg by mouth in the morning, at  noon, and at bedtime.      Calcium Carbonate-Vit D-Min (CALCIUM 600+D3 PLUS MINERALS PO) Take 1 tablet by mouth daily.      colestipol (COLESTID) 1 g tablet Take 1 g by mouth daily.      denosumab (PROLIA) 60 MG/ML SOSY injection Inject 60 mg into the skin every 6 (six) months.      omeprazole (PRILOSEC) 20 MG capsule Take 20 mg by mouth daily.      TURMERIC PO Take 1,000 mg by mouth in the morning.      valsartan (DIOVAN) 80 MG tablet Take 80 mg by mouth in the morning.      meloxicam (MOBIC) 15 MG tablet TAKE 1 TABLET BY MOUTH WITH FOOD EVERY DAY AS NEEDED FOR PAIN (Patient not taking: Reported on 12/24/2022) 30 tablet 2 Not Taking   methocarbamol (ROBAXIN) 500 MG tablet Take 1 tablet (500 mg total) by mouth 4 (four) times daily. (Patient not taking: Reported on 09/09/2022) 40 tablet 0    oxyCODONE 10 MG TABS Take 1 tablet (10 mg total) by mouth every 3 (three) hours as needed for severe pain ((score 7 to 10)). (Patient not taking: Reported on 09/09/2022) 40 tablet 0    No Known Allergies  Social History   Tobacco Use   Smoking status: Never   Smokeless tobacco: Never  Substance Use Topics   Alcohol use: Not Currently    No family history on file.   Review of Systems  Objective:  Physical Exam Vitals reviewed.  Constitutional:      Appearance: Normal appearance. She is normal weight.  HENT:  Head: Normocephalic and atraumatic.  Eyes:     Extraocular Movements: Extraocular movements intact.     Pupils: Pupils are equal, round, and reactive to light.  Cardiovascular:     Rate and Rhythm: Normal rate and regular rhythm.     Pulses: Normal pulses.  Pulmonary:     Effort: Pulmonary effort is normal.     Breath sounds: Normal breath sounds.  Abdominal:     Palpations: Abdomen is soft.  Musculoskeletal:     Cervical back: Normal range of motion and neck supple.     Left hip: Tenderness and bony tenderness present. Decreased range of motion. Decreased strength.  Neurological:      Mental Status: She is alert and oriented to person, place, and time.  Psychiatric:        Behavior: Behavior normal.     Vital signs in last 24 hours:    Labs:   Estimated body mass index is 23.83 kg/m as calculated from the following:   Height as of 12/29/22: 4\' 11"  (1.499 m).   Weight as of 12/29/22: 53.5 kg.   Imaging Review Plain radiographs demonstrate severe degenerative joint disease of the left hip(s). The bone quality appears to be good for age and reported activity level.      Assessment/Plan:  End stage arthritis, left hip(s)  The patient history, physical examination, clinical judgement of the provider and imaging studies are consistent with end stage degenerative joint disease of the left hip(s) and total hip arthroplasty is deemed medically necessary. The treatment options including medical management, injection therapy, arthroscopy and arthroplasty were discussed at length. The risks and benefits of total hip arthroplasty were presented and reviewed. The risks due to aseptic loosening, infection, stiffness, dislocation/subluxation,  thromboembolic complications and other imponderables were discussed.  The patient acknowledged the explanation, agreed to proceed with the plan and consent was signed. Patient is being admitted for inpatient treatment for surgery, pain control, PT, OT, prophylactic antibiotics, VTE prophylaxis, progressive ambulation and ADL's and discharge planning.The patient is planning to be discharged home with home health services

## 2023-01-02 ENCOUNTER — Ambulatory Visit (HOSPITAL_BASED_OUTPATIENT_CLINIC_OR_DEPARTMENT_OTHER): Payer: Medicare Other | Admitting: Certified Registered Nurse Anesthetist

## 2023-01-02 ENCOUNTER — Inpatient Hospital Stay (HOSPITAL_COMMUNITY)
Admission: RE | Admit: 2023-01-02 | Discharge: 2023-01-04 | DRG: 470 | Disposition: A | Discharge status: Home Health | Payer: Medicare Other | Attending: Orthopaedic Surgery | Admitting: Orthopaedic Surgery

## 2023-01-02 ENCOUNTER — Encounter (HOSPITAL_COMMUNITY)
Admission: RE | Disposition: A | Discharge status: Home Health | Payer: Self-pay | Source: Home / Self Care | Attending: Orthopaedic Surgery

## 2023-01-02 ENCOUNTER — Ambulatory Visit (HOSPITAL_COMMUNITY): Payer: Medicare Other | Admitting: Certified Registered Nurse Anesthetist

## 2023-01-02 ENCOUNTER — Other Ambulatory Visit: Payer: Self-pay

## 2023-01-02 ENCOUNTER — Other Ambulatory Visit (HOSPITAL_COMMUNITY): Payer: Medicare Other

## 2023-01-02 ENCOUNTER — Ambulatory Visit (HOSPITAL_COMMUNITY): Payer: Medicare Other

## 2023-01-02 ENCOUNTER — Encounter (HOSPITAL_COMMUNITY): Payer: Self-pay | Admitting: Orthopaedic Surgery

## 2023-01-02 ENCOUNTER — Observation Stay (HOSPITAL_COMMUNITY): Payer: Medicare Other

## 2023-01-02 DIAGNOSIS — I10 Essential (primary) hypertension: Secondary | ICD-10-CM | POA: Diagnosis present

## 2023-01-02 DIAGNOSIS — K219 Gastro-esophageal reflux disease without esophagitis: Secondary | ICD-10-CM | POA: Diagnosis present

## 2023-01-02 DIAGNOSIS — M1612 Unilateral primary osteoarthritis, left hip: Principal | ICD-10-CM | POA: Diagnosis present

## 2023-01-02 DIAGNOSIS — Z981 Arthrodesis status: Secondary | ICD-10-CM

## 2023-01-02 DIAGNOSIS — Z79899 Other long term (current) drug therapy: Secondary | ICD-10-CM

## 2023-01-02 DIAGNOSIS — M810 Age-related osteoporosis without current pathological fracture: Secondary | ICD-10-CM | POA: Diagnosis not present

## 2023-01-02 DIAGNOSIS — Z96642 Presence of left artificial hip joint: Secondary | ICD-10-CM

## 2023-01-02 DIAGNOSIS — Z471 Aftercare following joint replacement surgery: Secondary | ICD-10-CM | POA: Diagnosis not present

## 2023-01-02 DIAGNOSIS — R110 Nausea: Secondary | ICD-10-CM | POA: Diagnosis present

## 2023-01-02 HISTORY — PX: TOTAL HIP ARTHROPLASTY: SHX124

## 2023-01-02 LAB — TYPE AND SCREEN

## 2023-01-02 SURGERY — ARTHROPLASTY, HIP, TOTAL, ANTERIOR APPROACH
Anesthesia: Spinal | Site: Hip | Laterality: Left

## 2023-01-02 MED ORDER — PHENOL 1.4 % MT LIQD: 1.0000 | OROMUCOSAL | Status: DC | PRN

## 2023-01-02 MED ORDER — ACETAMINOPHEN 10 MG/ML IV SOLN
INTRAVENOUS | Status: AC
  Administered 2023-01-02: 1000 mg via INTRAVENOUS
  Filled 2023-01-02: qty 100

## 2023-01-02 MED ORDER — POVIDONE-IODINE 10 % EX SWAB: 2.0000 | Freq: Once | CUTANEOUS | Status: AC

## 2023-01-02 MED ORDER — PHENYLEPHRINE HCL-NACL 20-0.9 MG/250ML-% IV SOLN
INTRAVENOUS | Status: DC | PRN
  Administered 2023-01-02: 25 ug/min via INTRAVENOUS

## 2023-01-02 MED ORDER — PROPOFOL 500 MG/50ML IV EMUL
INTRAVENOUS | Status: DC | PRN
  Administered 2023-01-02: 75 ug/kg/min via INTRAVENOUS

## 2023-01-02 MED ORDER — ONDANSETRON HCL 4 MG/2ML IJ SOLN
INTRAMUSCULAR | Status: DC | PRN
  Administered 2023-01-02: 4 mg via INTRAVENOUS

## 2023-01-02 MED ORDER — TRANEXAMIC ACID-NACL 1000-0.7 MG/100ML-% IV SOLN
1000.0000 mg | INTRAVENOUS | Status: AC
  Administered 2023-01-02: 1000 mg via INTRAVENOUS
  Filled 2023-01-02: qty 100

## 2023-01-02 MED ORDER — AMISULPRIDE (ANTIEMETIC) 5 MG/2ML IV SOLN: 10.0000 mg | Freq: Once | INTRAVENOUS | Status: DC | PRN

## 2023-01-02 MED ORDER — DOCUSATE SODIUM 100 MG PO CAPS
100.0000 mg | ORAL_CAPSULE | Freq: Two times a day (BID) | ORAL | Status: DC
  Filled 2023-01-02 (×4): qty 1

## 2023-01-02 MED ORDER — CEFAZOLIN SODIUM-DEXTROSE 1-4 GM/50ML-% IV SOLN
1.0000 g | Freq: Four times a day (QID) | INTRAVENOUS | Status: AC
  Administered 2023-01-02 – 2023-01-03 (×2): 1 g via INTRAVENOUS
  Filled 2023-01-02 (×2): qty 50

## 2023-01-02 MED ORDER — SODIUM CHLORIDE 0.9 % IR SOLN
Status: DC | PRN
  Administered 2023-01-02: 1000 mL

## 2023-01-02 MED ORDER — CEFAZOLIN SODIUM-DEXTROSE 2-4 GM/100ML-% IV SOLN
2.0000 g | INTRAVENOUS | Status: AC
  Administered 2023-01-02: 2 g via INTRAVENOUS
  Filled 2023-01-02: qty 100

## 2023-01-02 MED ORDER — OXYCODONE HCL 5 MG PO TABS
10.0000 mg | ORAL_TABLET | ORAL | Status: DC | PRN
  Administered 2023-01-03: 10 mg via ORAL

## 2023-01-02 MED ORDER — DEXAMETHASONE SODIUM PHOSPHATE 10 MG/ML IJ SOLN
INTRAMUSCULAR | Status: AC
  Filled 2023-01-02: qty 1

## 2023-01-02 MED ORDER — SODIUM CHLORIDE 0.9 % IV SOLN: INTRAVENOUS | Status: DC

## 2023-01-02 MED ORDER — DEXAMETHASONE SODIUM PHOSPHATE 10 MG/ML IJ SOLN
INTRAMUSCULAR | Status: DC | PRN
  Administered 2023-01-02: 10 mg via INTRAVENOUS

## 2023-01-02 MED ORDER — METHOCARBAMOL 500 MG IVPB - SIMPLE MED: 500.0000 mg | Freq: Four times a day (QID) | INTRAVENOUS | Status: DC | PRN

## 2023-01-02 MED ORDER — ONDANSETRON HCL 4 MG PO TABS
4.0000 mg | ORAL_TABLET | Freq: Four times a day (QID) | ORAL | Status: DC | PRN
  Administered 2023-01-03 – 2023-01-04 (×3): 4 mg via ORAL
  Filled 2023-01-02 (×3): qty 1

## 2023-01-02 MED ORDER — ACETAMINOPHEN 10 MG/ML IV SOLN: 1000.0000 mg | Freq: Once | INTRAVENOUS | Status: DC | PRN

## 2023-01-02 MED ORDER — ACETAMINOPHEN 325 MG PO TABS
325.0000 mg | ORAL_TABLET | Freq: Four times a day (QID) | ORAL | Status: DC | PRN
  Administered 2023-01-02: 650 mg via ORAL
  Filled 2023-01-02: qty 2

## 2023-01-02 MED ORDER — ONDANSETRON HCL 4 MG/2ML IJ SOLN: 4.0000 mg | Freq: Once | INTRAMUSCULAR | Status: DC | PRN

## 2023-01-02 MED ORDER — STERILE WATER FOR IRRIGATION IR SOLN
Status: DC | PRN
  Administered 2023-01-02: 2000 mL

## 2023-01-02 MED ORDER — HYDROMORPHONE HCL 1 MG/ML IJ SOLN: 0.5000 mg | INTRAMUSCULAR | Status: DC | PRN

## 2023-01-02 MED ORDER — ORAL CARE MOUTH RINSE: 15.0000 mL | Freq: Once | OROMUCOSAL | Status: AC

## 2023-01-02 MED ORDER — METOCLOPRAMIDE HCL 5 MG/ML IJ SOLN
5.0000 mg | Freq: Three times a day (TID) | INTRAMUSCULAR | Status: DC | PRN
  Administered 2023-01-03 – 2023-01-04 (×3): 10 mg via INTRAVENOUS
  Filled 2023-01-02 (×3): qty 2

## 2023-01-02 MED ORDER — LACTATED RINGERS IV SOLN: INTRAVENOUS | Status: DC

## 2023-01-02 MED ORDER — METHOCARBAMOL 500 MG IVPB - SIMPLE MED
INTRAVENOUS | Status: AC
  Administered 2023-01-02: 500 mg via INTRAVENOUS
  Filled 2023-01-02: qty 55

## 2023-01-02 MED ORDER — PROPOFOL 10 MG/ML IV BOLUS
INTRAVENOUS | Status: DC | PRN
  Administered 2023-01-02: 40 mg via INTRAVENOUS

## 2023-01-02 MED ORDER — DIPHENHYDRAMINE HCL 12.5 MG/5ML PO ELIX: 12.5000 mg | ORAL_SOLUTION | ORAL | Status: DC | PRN

## 2023-01-02 MED ORDER — ASPIRIN 81 MG PO CHEW
81.0000 mg | CHEWABLE_TABLET | Freq: Two times a day (BID) | ORAL | Status: DC
  Administered 2023-01-02 – 2023-01-04 (×4): 81 mg via ORAL
  Filled 2023-01-02 (×4): qty 1

## 2023-01-02 MED ORDER — BUPIVACAINE IN DEXTROSE 0.75-8.25 % IT SOLN
INTRATHECAL | Status: DC | PRN
  Administered 2023-01-02: 1.7 mL via INTRATHECAL

## 2023-01-02 MED ORDER — MENTHOL 3 MG MT LOZG: 1.0000 | LOZENGE | OROMUCOSAL | Status: DC | PRN

## 2023-01-02 MED ORDER — MIDAZOLAM HCL 2 MG/2ML IJ SOLN
INTRAMUSCULAR | Status: AC
  Filled 2023-01-02: qty 2

## 2023-01-02 MED ORDER — PANTOPRAZOLE SODIUM 40 MG PO TBEC
40.0000 mg | DELAYED_RELEASE_TABLET | Freq: Every day | ORAL | Status: DC
  Administered 2023-01-02 – 2023-01-04 (×3): 40 mg via ORAL
  Filled 2023-01-02 (×3): qty 1

## 2023-01-02 MED ORDER — OXYCODONE HCL 5 MG PO TABS
5.0000 mg | ORAL_TABLET | ORAL | Status: DC | PRN
  Administered 2023-01-03 (×2): 5 mg via ORAL
  Filled 2023-01-02: qty 1
  Filled 2023-01-02 (×2): qty 2
  Filled 2023-01-02: qty 1

## 2023-01-02 MED ORDER — 0.9 % SODIUM CHLORIDE (POUR BTL) OPTIME
TOPICAL | Status: DC | PRN
  Administered 2023-01-02: 1000 mL

## 2023-01-02 MED ORDER — METOCLOPRAMIDE HCL 5 MG PO TABS: 5.0000 mg | ORAL_TABLET | Freq: Three times a day (TID) | ORAL | Status: DC | PRN

## 2023-01-02 MED ORDER — MIDAZOLAM HCL 2 MG/2ML IJ SOLN
INTRAMUSCULAR | Status: DC | PRN
  Administered 2023-01-02: 2 mg via INTRAVENOUS

## 2023-01-02 MED ORDER — METHOCARBAMOL 500 MG PO TABS
500.0000 mg | ORAL_TABLET | Freq: Four times a day (QID) | ORAL | Status: DC | PRN
  Administered 2023-01-02 – 2023-01-04 (×4): 500 mg via ORAL
  Filled 2023-01-02 (×4): qty 1

## 2023-01-02 MED ORDER — ONDANSETRON HCL 4 MG/2ML IJ SOLN
INTRAMUSCULAR | Status: AC
  Filled 2023-01-02: qty 2

## 2023-01-02 MED ORDER — IRBESARTAN 75 MG PO TABS
75.0000 mg | ORAL_TABLET | Freq: Every day | ORAL | Status: DC
  Administered 2023-01-02 – 2023-01-04 (×3): 75 mg via ORAL
  Filled 2023-01-02 (×3): qty 1

## 2023-01-02 MED ORDER — ONDANSETRON HCL 4 MG/2ML IJ SOLN
4.0000 mg | Freq: Four times a day (QID) | INTRAMUSCULAR | Status: DC | PRN
  Administered 2023-01-03: 4 mg via INTRAVENOUS
  Filled 2023-01-02: qty 2

## 2023-01-02 MED ORDER — ALUM & MAG HYDROXIDE-SIMETH 200-200-20 MG/5ML PO SUSP: 30.0000 mL | ORAL | Status: DC | PRN

## 2023-01-02 MED ORDER — CHLORHEXIDINE GLUCONATE 0.12 % MT SOLN
15.0000 mL | Freq: Once | OROMUCOSAL | Status: AC
  Administered 2023-01-02: 15 mL via OROMUCOSAL

## 2023-01-02 MED ORDER — ORAL CARE MOUTH RINSE: 15.0000 mL | OROMUCOSAL | Status: DC | PRN

## 2023-01-02 MED ORDER — FENTANYL CITRATE PF 50 MCG/ML IJ SOSY: 25.0000 ug | PREFILLED_SYRINGE | INTRAMUSCULAR | Status: DC | PRN

## 2023-01-02 SURGICAL SUPPLY — 42 items
APL SKNCLS STERI-STRIP NONHPOA (GAUZE/BANDAGES/DRESSINGS)
BAG COUNTER SPONGE SURGICOUNT (BAG) ×1 IMPLANT
BAG SPEC THK2 15X12 ZIP CLS (MISCELLANEOUS)
BAG SPNG CNTER NS LX DISP (BAG) ×1
BAG ZIPLOCK 12X15 (MISCELLANEOUS) IMPLANT
BALL HIP CERAMIC (Hips) IMPLANT
BENZOIN TINCTURE PRP APPL 2/3 (GAUZE/BANDAGES/DRESSINGS) IMPLANT
BLADE SAW SGTL 18X1.27X75 (BLADE) ×1 IMPLANT
COVER PERINEAL POST (MISCELLANEOUS) ×1 IMPLANT
COVER SURGICAL LIGHT HANDLE (MISCELLANEOUS) ×1 IMPLANT
CUP SECTOR GRIPTON 50MM (Cup) IMPLANT
DRAPE FOOT SWITCH (DRAPES) ×1 IMPLANT
DRAPE STERI IOBAN 125X83 (DRAPES) ×1 IMPLANT
DRAPE U-SHAPE 47X51 STRL (DRAPES) ×2 IMPLANT
DRSG AQUACEL AG ADV 3.5X10 (GAUZE/BANDAGES/DRESSINGS) ×1 IMPLANT
DURAPREP 26ML APPLICATOR (WOUND CARE) ×1 IMPLANT
ELECT REM PT RETURN 15FT ADLT (MISCELLANEOUS) ×1 IMPLANT
GAUZE XEROFORM 1X8 LF (GAUZE/BANDAGES/DRESSINGS) IMPLANT
GLOVE BIO SURGEON STRL SZ7.5 (GLOVE) ×1 IMPLANT
GLOVE BIOGEL PI IND STRL 8 (GLOVE) ×2 IMPLANT
GLOVE ECLIPSE 8.0 STRL XLNG CF (GLOVE) ×1 IMPLANT
GOWN STRL REUS W/ TWL XL LVL3 (GOWN DISPOSABLE) ×2 IMPLANT
GOWN STRL REUS W/TWL XL LVL3 (GOWN DISPOSABLE) ×2
HANDPIECE INTERPULSE COAX TIP (DISPOSABLE) ×1
HIP BALL CERAMIC (Hips) ×1 IMPLANT
HOLDER FOLEY CATH W/STRAP (MISCELLANEOUS) ×1 IMPLANT
KIT TURNOVER KIT A (KITS) IMPLANT
LINER ACETABULAR 32X50 (Liner) IMPLANT
PACK ANTERIOR HIP CUSTOM (KITS) ×1 IMPLANT
SET HNDPC FAN SPRY TIP SCT (DISPOSABLE) ×1 IMPLANT
STAPLER VISISTAT 35W (STAPLE) IMPLANT
STEM FEM ACTIS HIGH SZ3 (Stem) IMPLANT
STRIP CLOSURE SKIN 1/2X4 (GAUZE/BANDAGES/DRESSINGS) IMPLANT
SUT ETHIBOND NAB CT1 #1 30IN (SUTURE) ×1 IMPLANT
SUT ETHILON 2 0 PS N (SUTURE) IMPLANT
SUT MNCRL AB 4-0 PS2 18 (SUTURE) IMPLANT
SUT VIC AB 0 CT1 36 (SUTURE) ×1 IMPLANT
SUT VIC AB 1 CT1 36 (SUTURE) ×1 IMPLANT
SUT VIC AB 2-0 CT1 27 (SUTURE) ×2
SUT VIC AB 2-0 CT1 TAPERPNT 27 (SUTURE) ×2 IMPLANT
TRAY FOLEY MTR SLVR 16FR STAT (SET/KITS/TRAYS/PACK) IMPLANT
YANKAUER SUCT BULB TIP NO VENT (SUCTIONS) ×1 IMPLANT

## 2023-01-02 NOTE — Anesthesia Preprocedure Evaluation (Addendum)
Anesthesia Evaluation  Patient identified by MRN, date of birth, ID band Patient awake    Reviewed: Allergy & Precautions, NPO status , Patient's Chart, lab work & pertinent test results  History of Anesthesia Complications (+) PONV and history of anesthetic complications  Airway Mallampati: II  TM Distance: >3 FB Neck ROM: Full    Dental no notable dental hx.    Pulmonary neg pulmonary ROS   Pulmonary exam normal        Cardiovascular hypertension, Normal cardiovascular exam     Neuro/Psych negative neurological ROS  negative psych ROS   GI/Hepatic Neg liver ROS,GERD  Medicated and Controlled,,  Endo/Other  negative endocrine ROS    Renal/GU negative Renal ROS     Musculoskeletal  (+) Arthritis ,  LUMBAR SPINE SURGERY   Abdominal   Peds  Hematology negative hematology ROS (+)   Anesthesia Other Findings OSTEOARTHRITIS  DEGENERATIVE JOINT DISEASE LEFT HIP  Reproductive/Obstetrics                             Anesthesia Physical Anesthesia Plan  ASA: 2  Anesthesia Plan: Spinal   Post-op Pain Management:    Induction: Intravenous  PONV Risk Score and Plan: 3 and Ondansetron, Dexamethasone, Midazolam, Propofol infusion and Treatment may vary due to age or medical condition  Airway Management Planned: Simple Face Mask  Additional Equipment:   Intra-op Plan:   Post-operative Plan: Extubation in OR  Informed Consent: I have reviewed the patients History and Physical, chart, labs and discussed the procedure including the risks, benefits and alternatives for the proposed anesthesia with the patient or authorized representative who has indicated his/her understanding and acceptance.     Dental advisory given  Plan Discussed with: CRNA  Anesthesia Plan Comments:        Anesthesia Quick Evaluation

## 2023-01-02 NOTE — Anesthesia Procedure Notes (Signed)
Spinal  Patient location during procedure: OR Start time: 01/02/2023 1:34 PM End time: 01/02/2023 1:38 PM Reason for block: surgical anesthesia Staffing Performed: anesthesiologist  Anesthesiologist: Leonides Grills, MD Performed by: Leonides Grills, MD Authorized by: Leonides Grills, MD   Preanesthetic Checklist Completed: patient identified, IV checked, risks and benefits discussed, surgical consent, monitors and equipment checked, pre-op evaluation and timeout performed Spinal Block Patient position: sitting Prep: DuraPrep Patient monitoring: cardiac monitor, continuous pulse ox and blood pressure Approach: midline Location: L3-4 Injection technique: single-shot Needle Needle type: Pencan  Needle gauge: 24 G Needle length: 9 cm Assessment Sensory level: T10 Events: CSF return Additional Notes Functioning IV was confirmed and monitors were applied. Sterile prep and drape, including hand hygiene and sterile gloves were used. The patient was positioned and the spine was prepped. The skin was anesthetized with lidocaine.  Free flow of clear CSF was obtained prior to injecting local anesthetic into the CSF.  The spinal needle aspirated freely following injection.  The needle was carefully withdrawn.  The patient tolerated the procedure well.

## 2023-01-02 NOTE — Anesthesia Postprocedure Evaluation (Signed)
Anesthesia Post Note  Patient: Susan Woodward  Procedure(s) Performed: LEFT TOTAL HIP ARTHROPLASTY ANTERIOR APPROACH (Left: Hip)     Patient location during evaluation: PACU Anesthesia Type: Spinal Level of consciousness: awake Pain management: pain level controlled Vital Signs Assessment: post-procedure vital signs reviewed and stable Respiratory status: spontaneous breathing, nonlabored ventilation and respiratory function stable Cardiovascular status: blood pressure returned to baseline and stable Postop Assessment: no apparent nausea or vomiting Anesthetic complications: no   No notable events documented.  Last Vitals:  Vitals:   01/02/23 1630 01/02/23 1646  BP: 139/81 133/81  Pulse: 73 73  Resp: 14 14  Temp:  36.8 C  SpO2: 96% 98%    Last Pain:  Vitals:   01/02/23 1646  TempSrc: Oral  PainSc:                  Vennie Salsbury P Cloyde Oregel

## 2023-01-02 NOTE — Plan of Care (Signed)
  Problem: Education: Goal: Knowledge of the prescribed therapeutic regimen will improve Outcome: Progressing   Problem: Pain Management: Goal: Pain level will decrease with appropriate interventions Outcome: Progressing   Problem: Nutrition: Goal: Adequate nutrition will be maintained Outcome: Progressing   

## 2023-01-02 NOTE — Interval H&P Note (Signed)
History and Physical Interval Note: The patient understands that she is here today for a left total hip replacement to treat her severe left hip arthritis.  There has been no acute or interval change in her medical status.  The risks and benefits of surgery been discussed in detail and informed consent is obtained.  The left operative hip has been marked.  01/02/2023 11:48 AM  Susan Woodward  has presented today for surgery, with the diagnosis of OSTEOARTHRITIS / DEGENERATIVE JOINT DISEASE LEFT HIP.  The various methods of treatment have been discussed with the patient and family. After consideration of risks, benefits and other options for treatment, the patient has consented to  Procedure(s): LEFT TOTAL HIP ARTHROPLASTY ANTERIOR APPROACH (Left) as a surgical intervention.  The patient's history has been reviewed, patient examined, no change in status, stable for surgery.  I have reviewed the patient's chart and labs.  Questions were answered to the patient's satisfaction.     Kathryne Hitch

## 2023-01-02 NOTE — Transfer of Care (Signed)
Immediate Anesthesia Transfer of Care Note  Patient: Susan Woodward  Procedure(s) Performed: LEFT TOTAL HIP ARTHROPLASTY ANTERIOR APPROACH (Left: Hip)  Patient Location: PACU  Anesthesia Type:Spinal  Level of Consciousness: drowsy  Airway & Oxygen Therapy: Patient Spontanous Breathing and Patient connected to face mask oxygen  Post-op Assessment: Report given to RN and Post -op Vital signs reviewed and stable  Post vital signs: Reviewed and stable  Last Vitals:  Vitals Value Taken Time  BP 85/54 01/02/23 1504  Temp    Pulse 80 01/02/23 1506  Resp 18 01/02/23 1506  SpO2 98 % 01/02/23 1506  Vitals shown include unvalidated device data.  Last Pain:  Vitals:   01/02/23 1045  TempSrc: Oral  PainSc: 6       Patients Stated Pain Goal: 4 (01/02/23 1045)  Complications: No notable events documented.

## 2023-01-02 NOTE — Anesthesia Procedure Notes (Signed)
Spinal  Staffing Performed by: Leonides Grills, MD Authorized by: Leonides Grills, MD

## 2023-01-02 NOTE — Evaluation (Signed)
Physical Therapy Evaluation Patient Details Name: Susan Woodward MRN: [id number] DOB: [birth year] Today's Date: 01/02/2023  History of Present Illness  67 yo female presents to therapy s/p L THA, anterior approach on 01/02/2023 due to failure of conservative measures. Pt PMH includes but is not limited to: degenerative spondylolisthesis, spinal stenosis of lumbar region s/p lumbar fusion (2022), R hip trochanteric bursitis, GERD, HTN and R wrist sx.  Clinical Impression    Susan Woodward is a 67 y.o. female POD 0 s/p L THA. Patient reports IND with mobility at baseline. Patient is now limited by functional impairments (see PT problem list below) and requires min A for bed mobility and unable to safely assess gait or transfers due to slow regression of anesthesia/spinal and pt reporting abn sensation, decreased motor control and coordination B LEs. Patient will benefit from continued skilled PT interventions to address impairments and progress towards PLOF. Acute PT will follow to progress mobility and stair training in preparation for safe discharge home with family support and [facility] services.      Recommendations for follow up therapy are one component of a multi-disciplinary discharge planning process, led by the attending physician.  Recommendations may be updated based on patient status, additional functional criteria and insurance authorization.  Follow Up Recommendations       Assistance Recommended at Discharge Intermittent Supervision/Assistance  Patient can return home with the following  A lot of help with walking and/or transfers;A lot of help with bathing/dressing/bathroom;Assistance with cooking/housework;Assist for transportation;Help with stairs or ramp for entrance    Equipment Recommendations None recommended by PT (pt reports DME in home setting)  Recommendations for Other Services       Functional Status Assessment Patient has had a recent decline in their functional status and  demonstrates the ability to make significant improvements in function in a reasonable and predictable amount of time.     Precautions / Restrictions Precautions Precautions: Fall Restrictions Weight Bearing Restrictions: No      Mobility  Bed Mobility Overal bed mobility: Needs Assistance Bed Mobility: Supine to Sit, Sit to Supine     Supine to sit: Min assist Sit to supine: Min assist   General bed mobility comments: min A for L LE to EOB, supine to sit with HOB elevated and use of bed rails, pt able to scoot laterally in sitting on EOB to L with min guard    Transfers                   General transfer comment: NT    Ambulation/Gait               General Gait Details: NT  Stairs            Wheelchair Mobility    Modified Rankin (Stroke Patients Only)       Balance Overall balance assessment: Needs assistance Sitting-balance support: Feet supported Sitting balance-Leahy Scale: Good                                       Pertinent Vitals/Pain Pain Assessment Pain Assessment: 0-10 Pain Score: 4  Pain Location: L hip Pain Descriptors / Indicators: Dull, Aching, Operative site guarding Pain Intervention(s): Limited activity within patient's tolerance, Monitored during session, Repositioned, Ice applied    Home Living Family/patient expects to be discharged to:: Private residence Living Arrangements: Spouse/significant other Available Help at Discharge: Family  Type of Home: House Home Access: Stairs to enter Entrance Stairs-Rails: None Entrance Stairs-Number of Steps: 3   Home Layout: One level Home Equipment: Agricultural consultant (2 wheels)      Prior Function Prior Level of Function : Independent/Modified Independent             Mobility Comments: IND with all ADLs, self care tasks, IADLs, driving No AD       Hand Dominance        Extremity/Trunk Assessment        Lower Extremity Assessment Lower  Extremity Assessment: LLE deficits/detail LLE Deficits / Details: ankle DF/PF 4+/5 LLE Sensation: decreased light touch;decreased proprioception    Cervical / Trunk Assessment Cervical / Trunk Assessment:  (wfl)  Communication   Communication: No difficulties  Cognition Arousal/Alertness: Awake/alert Behavior During Therapy: WFL for tasks assessed/performed Overall Cognitive Status: Within Functional Limits for tasks assessed                                          General Comments      Exercises     Assessment/Plan    PT Assessment Patient needs continued PT services  PT Problem List Decreased strength;Decreased range of motion;Decreased activity tolerance;Decreased balance;Decreased mobility;Decreased coordination;Pain       PT Treatment Interventions DME instruction;Gait training;Stair training;Functional mobility training;Therapeutic activities;Therapeutic exercise;Balance training;Neuromuscular re-education;Patient/family education;Modalities    PT Goals (Current goals can be found in the Care Plan section)  Acute Rehab PT Goals Patient Stated Goal: to walk without pain PT Goal Formulation: With patient Time For Goal Achievement: 01/16/23 Potential to Achieve Goals: Good    Frequency 7X/week     Co-evaluation               AM-PAC PT "6 Clicks" Mobility  Outcome Measure Help needed turning from your back to your side while in a flat bed without using bedrails?: A Little Help needed moving from lying on your back to sitting on the side of a flat bed without using bedrails?: A Little Help needed moving to and from a bed to a chair (including a wheelchair)?: A Little Help needed standing up from a chair using your arms (e.g., wheelchair or bedside chair)?: Total Help needed to walk in hospital room?: Total Help needed climbing 3-5 steps with a railing? : Total 6 Click Score: 12    End of Session   Activity Tolerance: Other (comment)  (limited due to slow regression of anesthesia/spinal and reports of abn LE/buttock sensation and motor control/coordination) Patient left: in bed;with call bell/phone within reach;with family/visitor present Nurse Communication: Mobility status;Patient requests pain meds PT Visit Diagnosis: Unsteadiness on feet (R26.81);Other abnormalities of gait and mobility (R26.89);Muscle weakness (generalized) (M62.81);Pain Pain - Right/Left: Left Pain - part of body: Hip    Time: 0865-7846 PT Time Calculation (min) (ACUTE ONLY): 23 min   Charges:   PT Evaluation $PT Eval Low Complexity: 1 Low PT Treatments $Therapeutic Activity: 8-22 mins        Johnny Bridge, PT Acute Rehab   Jacqualyn Posey 01/02/2023, 7:07 PM

## 2023-01-02 NOTE — Op Note (Signed)
Operative Note  Date of operation: 01/02/2023 Preoperative diagnosis: Left hip primary osteoarthritis Postoperative diagnosis: Same  Procedure: Left direct anterior total hip arthroplasty  Implants: Implant Name Type Inv. Item Serial No. Manufacturer Lot No. LRB No. Used Action  CUP SECTOR GRIPTON - [id number] Cup CUP SECTOR GRIPTON  [employer] [id number] Left 1 Implanted  LINER ACETABULAR 32X50 - [id number] Liner LINER ACETABULAR 32X50  DEPUY ORTH[employer] [id number] Left 1 Implanted  STEM FEM ACTIS HIGH SZ3 - [id number] Stem STEM FEM ACTIS HIGH SZ3  DEPUY ORTH[employer] [id number] Left 1 Implanted  HIP BALL CERAMIC - [id number] Hips HIP BALL CERAMIC  DEPU[employer]TH[employer] [id number] Left 1 Implanted   Surgeon: Vanita Panda. Magnus Ivan, MD Assistant: Darron Doom, RNFA  Anesthesia: Spinal EBL: 300 cc Antibiotics: IV Ancef Complications: None  Indications: The patient is a 67 year old female with debilitating arthritis involving her left hip this been well-documented with clinical exam and x-ray findings.  She has tried and failed conservative treatment for over a year now and at this point her left hip pain is daily and it is detrimentally affecting her mobility, her quality of life and her activities of daily living to the point she does wish to proceed with a total hip replacement on the left side.  We did have a long and thorough discussion about this and discussed the risk of acute blood loss anemia, nerve or vessel injury, fracture, infection, DVT, dislocation, implant failure, leg length differences and wound healing issues.  We understand that our goals are decreased pain, improve mobility and improve quality of life.  Procedure description: After informed consent was obtained and the appropriate left hip was marked, the patient was brought to the operating room and sat up on her stretcher where spinal anesthesia was obtained.  She was then laid in supine position on the stretcher and  a Foley catheter was placed.  Traction boots were placed on both her feet and she was placed supine on the Hana fracture table with a perineal post in place in both legs and inline skeletal traction devices but no traction applied.  Her left operative hip was prepped and draped with DuraPrep and sterile drapes.  Timeout was called and she was identified as a correct patient and the correct left hip.  An incision was then made just inferior and posterior to the ASIS and carried slightly obliquely down the leg.  Dissection was carried down the tensor fascia lata muscle and tensor fascia was then divided longitudinally to proceed with direct and approach the hip.  Circumflex vessels were identified cauterized and hip capsule identified and opened up in an L-type format finding a moderate joint effusion.  Cobra retractors were placed around the medial and lateral femoral neck and a femoral neck cut was made with an oscillating saw just proximal to the lesser trochanter and this Was completed with an osteotome.  A corkscrew guide was placed in the femoral head the femoral head was removed in its entirety.  There was a significant area of cartilage wear.  A bent Hohmann was placed over the medial acetabular rim and remnants of the acetabular labrum and other debris removed.  Reaming was initiated from a size 43 reamer and stepwise increments going up to a size 49 reamer with all reamers placed under direct visualization and the last reamer was placed under direct fluoroscopy in order to obtain the depth of reaming, the inclination and anteversion.  The real DePuy sector GRIPTION acetabular pendant size 50 placed without  difficulty and a 32+0 liner was also placed within that acetabular component.  Attention was then turned to the femur.  With the left leg externally rotated to 120 degrees, extended and adducted, a Mueller retractor was placed medially and a Hohmann retractor was placed behind the greater trochanter.  The  lateral joint capsule was released and a box cutting osteotome was used into the femoral canal.  Broaching was then initiated using the Actis broaching system from a size 0 going to a size 3.  With a size 3 in place we trialed a standard offset femoral neck and a 32+1 trial hip ball.  The leg was brought over and up and with traction internal rotation reduced in the pelvis.  Based on clinical and radiographic assessment we needed more offset and leg length.  The hip was then dislocated and we remove the trial components.  We placed the real Actis femoral component size 3 but 1 with high offset and the real 32+5 ceramic head ball.  This reduced into the pelvis and we are pleased with leg length, offset, range of motion and stability assessed both mechanically and radiographically.  The soft tissue was then irrigated with normal saline solution.  The joint capsule was closed with interrupted #1 Ethibond suture followed by #1 Vicryl close the tensor fascia.  0 Vicryl was used to close deep tissue and 2-0 Vicryl was used to close subcutaneous tissue.  The skin was closed with staples.  Well-padded sterile dressing was applied.  She was taken off on table taken recovery room in stable condition.

## 2023-01-03 DIAGNOSIS — Z79899 Other long term (current) drug therapy: Secondary | ICD-10-CM | POA: Diagnosis not present

## 2023-01-03 DIAGNOSIS — I10 Essential (primary) hypertension: Secondary | ICD-10-CM | POA: Diagnosis present

## 2023-01-03 DIAGNOSIS — M810 Age-related osteoporosis without current pathological fracture: Secondary | ICD-10-CM | POA: Diagnosis present

## 2023-01-03 DIAGNOSIS — M1612 Unilateral primary osteoarthritis, left hip: Secondary | ICD-10-CM | POA: Diagnosis present

## 2023-01-03 DIAGNOSIS — Z981 Arthrodesis status: Secondary | ICD-10-CM | POA: Diagnosis not present

## 2023-01-03 DIAGNOSIS — K219 Gastro-esophageal reflux disease without esophagitis: Secondary | ICD-10-CM | POA: Diagnosis present

## 2023-01-03 DIAGNOSIS — R110 Nausea: Secondary | ICD-10-CM | POA: Diagnosis present

## 2023-01-03 LAB — CBC
HCT: 32.8 % — ABNORMAL LOW (ref 36.0–46.0)
Hemoglobin: 10.6 g/dL — ABNORMAL LOW (ref 12.0–15.0)
MCH: 30.6 pg (ref 26.0–34.0)
MCHC: 32.3 g/dL (ref 30.0–36.0)
MCV: 94.8 fL (ref 80.0–100.0)
Platelets: 300 10*3/uL (ref 150–400)
RBC: 3.46 MIL/uL — ABNORMAL LOW (ref 3.87–5.11)
RDW: 12.5 % (ref 11.5–15.5)
WBC: 10.8 10*3/uL — ABNORMAL HIGH (ref 4.0–10.5)
nRBC: 0 % (ref 0.0–0.2)

## 2023-01-03 LAB — BASIC METABOLIC PANEL
Anion gap: 9 (ref 5–15)
BUN: 16 mg/dL (ref 8–23)
CO2: 23 mmol/L (ref 22–32)
Calcium: 8.2 mg/dL — ABNORMAL LOW (ref 8.9–10.3)
Chloride: 98 mmol/L (ref 98–111)
Creatinine, Ser: 0.69 mg/dL (ref 0.44–1.00)
GFR, Estimated: >60 mL/min (ref >60)
Glucose, Bld: 139 mg/dL — ABNORMAL HIGH (ref 70–99)
Potassium: 4.1 mmol/L (ref 3.5–5.1)
Sodium: 130 mmol/L — ABNORMAL LOW (ref 135–145)

## 2023-01-03 MED ORDER — HYDROCODONE-ACETAMINOPHEN 5-325 MG PO TABS
1.0000 | ORAL_TABLET | ORAL | Status: DC | PRN
  Administered 2023-01-03 – 2023-01-04 (×4): 1 via ORAL
  Filled 2023-01-03 (×4): qty 1

## 2023-01-03 NOTE — Progress Notes (Signed)
Physical Therapy Treatment Patient Details Name: Susan Woodward MRN: [id number] DOB: [birth year] Today's Date: 01/03/2023   History of Present Illness 67 yo female presents to therapy s/p L THA, anterior approach on 01/02/2023 due to failure of conservative measures. Pt PMH includes but is not limited to: degenerative spondylolisthesis, spinal stenosis of lumbar region s/p lumbar fusion (2022), R hip trochanteric bursitis, GERD, HTN and R wrist sx.    PT Comments    Pt agreeable to mobility however continues to be limited by nausea, despite meds.    Recommendations for follow up therapy are one component of a multi-disciplinary discharge planning process, led by the attending physician.  Recommendations may be updated based on patient status, additional functional criteria and insurance authorization.  Follow Up Recommendations       Assistance Recommended at Discharge Intermittent Supervision/Assistance  Patient can return home with the following A lot of help with bathing/dressing/bathroom;Assistance with cooking/housework;Assist for transportation;Help with stairs or ramp for entrance;A little help with walking and/or transfers   Equipment Recommendations  None recommended by PT (pt reports DME in home setting)    Recommendations for Other Services       Precautions / Restrictions Precautions Precautions: Fall Restrictions Weight Bearing Restrictions: No     Mobility  Bed Mobility Overal bed mobility: Needs Assistance Bed Mobility: Supine to Sit, Sit to Supine     Supine to sit: Min assist     General bed mobility comments: min A for L LE to EOB, supine to sit with HOB elevated and use of bed rails, pt able to scoot laterally in sitting on EOB to L with min guard    Transfers Overall transfer level: Needs assistance Equipment used: Rolling walker (2 wheels) Transfers: Sit to/from Stand Sit to Stand: Min assist, Min guard           General transfer comment: cues  for hand placement and LLE position    Ambulation/Gait Ambulation/Gait assistance: Min guard, Min assist Gait Distance (Feet): 5 Feet Assistive device: Rolling walker (2 wheels) Gait Pattern/deviations: Step-to pattern       General Gait Details: min/guard for safety, cues for sequence and RW position. ltd by nausea   Stairs             Wheelchair Mobility    Modified Rankin (Stroke Patients Only)       Balance                                            Cognition Arousal/Alertness: Awake/alert Behavior During Therapy: WFL for tasks assessed/performed Overall Cognitive Status: Within Functional Limits for tasks assessed                                          Exercises Total Joint Exercises Heel Slides: AAROM, Left (4 reps)    General Comments        Pertinent Vitals/Pain Pain Assessment Pain Assessment: 0-10 Pain Score: 4  Pain Location: L hip Pain Descriptors / Indicators: Dull, Aching, Operative site guarding Pain Intervention(s): Limited activity within patient's tolerance, Monitored during session, Premedicated before session, Repositioned    Home Living  Prior Function            PT Goals (current goals can now be found in the care plan section) Acute Rehab PT Goals Patient Stated Goal: to walk without pain PT Goal Formulation: With patient Time For Goal Achievement: 01/16/23 Potential to Achieve Goals: Good Progress towards PT goals: Progressing toward goals    Frequency    7X/week      PT Plan Current plan remains appropriate    Co-evaluation              AM-PAC PT "6 Clicks" Mobility   Outcome Measure  Help needed turning from your back to your side while in a flat bed without using bedrails?: A Little Help needed moving from lying on your back to sitting on the side of a flat bed without using bedrails?: A Little Help needed moving to and from a  bed to a chair (including a wheelchair)?: A Little Help needed standing up from a chair using your arms (e.g., wheelchair or bedside chair)?: A Little Help needed to walk in hospital room?: A Little Help needed climbing 3-5 steps with a railing? : Total 6 Click Score: 16    End of Session   Activity Tolerance: Other (comment);Treatment limited secondary to medical complications (Comment) (nausea) Patient left: in bed;with call bell/phone within reach;with family/visitor present Nurse Communication: Mobility status;Patient requests pain meds PT Visit Diagnosis: Unsteadiness on feet (R26.81);Other abnormalities of gait and mobility (R26.89);Muscle weakness (generalized) (M62.81);Pain Pain - Right/Left: Left Pain - part of body: Hip     Time: 1119-1130 PT Time Calculation (min) (ACUTE ONLY): 11 min  Charges:  $Therapeutic Activity: 8-22 mins                     Delice Bison, PT  Acute Rehab Dept [facility]) [phone]  01/03/2023    [facility] 01/03/2023, 11:44 AM

## 2023-01-03 NOTE — Plan of Care (Signed)
  Problem: Education: Goal: Knowledge of the prescribed therapeutic regimen will improve Outcome: Progressing   Problem: Activity: Goal: Ability to avoid complications of mobility impairment will improve Outcome: Progressing   Problem: Pain Management: Goal: Pain level will decrease with appropriate interventions Outcome: Progressing   

## 2023-01-03 NOTE — Discharge Instructions (Signed)

## 2023-01-03 NOTE — Progress Notes (Signed)
Physical Therapy Treatment Patient Details Name: Susan Woodward MRN: [id number] DOB: May 31, 1956 Today's Date: 01/03/2023   History of Present Illness 67 yo female presents to therapy s/p L THA, anterior approach on 01/02/2023 due to failure of conservative measures. Pt PMH includes but is not limited to: degenerative spondylolisthesis, spinal stenosis of lumbar region s/p lumbar fusion (2022), R hip trochanteric bursitis, GERD, HTN and R wrist sx.    PT Comments    Pt making progress however remains ltd by nausea. Incr gait distance tol this pm.  Continue PT in acute setting  Recommendations for follow up therapy are one component of a multi-disciplinary discharge planning process, led by the attending physician.  Recommendations may be updated based on patient status, additional functional criteria and insurance authorization.  Follow Up Recommendations       Assistance Recommended at Discharge Intermittent Supervision/Assistance  Patient can return home with the following A lot of help with bathing/dressing/bathroom;Assistance with cooking/housework;Assist for transportation;Help with stairs or ramp for entrance;A little help with walking and/or transfers   Equipment Recommendations  None recommended by PT (pt reports DME in home setting)    Recommendations for Other Services       Precautions / Restrictions Precautions Precautions: Fall Restrictions Weight Bearing Restrictions: No     Mobility  Bed Mobility Overal bed mobility: Needs Assistance Bed Mobility: Supine to Sit, Sit to Supine     Supine to sit: Min assist, Min guard     General bed mobility comments: min assist to initiate RLE off bed, min/guard for progression    Transfers Overall transfer level: Needs assistance Equipment used: Rolling walker (2 wheels) Transfers: Sit to/from Stand Sit to Stand: Min guard           General transfer comment: cues for hand placement and LLE position     Ambulation/Gait Ambulation/Gait assistance: Min guard Gait Distance (Feet): 30 Feet Assistive device: Rolling walker (2 wheels) Gait Pattern/deviations: Step-to pattern, Antalgic, Decreased stance time - left       General Gait Details: min/guard for safety, cues for sequence and RW position. ltd by nausea   Stairs             Wheelchair Mobility    Modified Rankin (Stroke Patients Only)       Balance   Sitting-balance support: Feet supported Sitting balance-Leahy Scale: Good     Standing balance support: During functional activity, Reliant on assistive device for balance Standing balance-Leahy Scale: Fair                              Cognition Arousal/Alertness: Awake/alert Behavior During Therapy: WFL for tasks assessed/performed Overall Cognitive Status: Within Functional Limits for tasks assessed                                          Exercises Total Joint Exercises Ankle Circles/Pumps: AROM, Both, 5 reps Quad Sets:  (reviewed, pt sitting upright in chair) Heel Slides: AAROM, Left (4 reps) Long Arc Quad: AROM, Left, Limitations Long Arc Quad Limitations: pain    General Comments        Pertinent Vitals/Pain Pain Assessment Pain Assessment: 0-10 Pain Score: 4  Pain Location: L hip Pain Descriptors / Indicators: Dull, Aching, Operative site guarding Pain Intervention(s): Limited activity within patient's tolerance, Monitored during session, Premedicated before session, Repositioned  Home Living                          Prior Function            PT Goals (current goals can now be found in the care plan section) Acute Rehab PT Goals Patient Stated Goal: to walk without pain PT Goal Formulation: With patient Time For Goal Achievement: 01/16/23 Potential to Achieve Goals: Good Progress towards PT goals: Progressing toward goals    Frequency    7X/week      PT Plan Current plan remains  appropriate    Co-evaluation              AM-PAC PT "6 Clicks" Mobility   Outcome Measure  Help needed turning from your back to your side while in a flat bed without using bedrails?: A Little Help needed moving from lying on your back to sitting on the side of a flat bed without using bedrails?: A Little Help needed moving to and from a bed to a chair (including a wheelchair)?: A Little Help needed standing up from a chair using your arms (e.g., wheelchair or bedside chair)?: A Little Help needed to walk in hospital room?: A Little Help needed climbing 3-5 steps with a railing? : Total 6 Click Score: 16    End of Session Equipment Utilized During Treatment: Gait belt Activity Tolerance: Patient tolerated treatment well Patient left: with call bell/phone within reach;in chair Nurse Communication: Mobility status;Patient requests pain meds PT Visit Diagnosis: Unsteadiness on feet (R26.81);Other abnormalities of gait and mobility (R26.89);Muscle weakness (generalized) (M62.81);Pain Pain - Right/Left: Left Pain - part of body: Hip     Time: 9147-8295 PT Time Calculation (min) (ACUTE ONLY): 24 min  Charges:  $Gait Training: 8-22 mins $Therapeutic Activity: 8-22 mins                     Delice Bison, PT  Acute Rehab Dept ([facility]) [phone]  01/03/2023    [facility] 01/03/2023, 3:04 PM

## 2023-01-03 NOTE — Progress Notes (Signed)
Subjective: 1 Day Post-Op Procedure(s) (LRB): LEFT TOTAL HIP ARTHROPLASTY ANTERIOR APPROACH (Left) Patient reports pain as moderate.  She reports significant nausea and this may be related to the pain medication.  She just had oxycodone.  She has had hydrocodone after back surgery in the past so we may switch her that medication to see how she responds.  Objective: Vital signs in last 24 hours: Temp:  [97.5 F (36.4 C)-98.4 F (36.9 C)] 97.7 F (36.5 C) (06/08 0954) Pulse Rate:  [66-92] 80 (06/08 0954) Resp:  [14-18] 17 (06/08 0954) BP: (85-164)/(54-90) 131/75 (06/08 0954) SpO2:  [95 %-100 %] 98 % (06/08 0954) Weight:  [53.5 kg] 53.5 kg (06/07 1646)  Intake/Output from previous day: 06/07 0701 - 06/08 0700 In: 2172 [I.V.:1817; IV Piggyback:355] Out: 2025 [Urine:1725; Blood:300] Intake/Output this shift: Total I/O In: 120 [P.O.:120] Out: -   Recent Labs    01/03/23 0337  HGB 10.6*   Recent Labs    01/03/23 0337  WBC 10.8*  RBC 3.46*  HCT 32.8*  PLT 300   Recent Labs    01/03/23 0337  NA 130*  K 4.1  CL 98  CO2 23  BUN 16  CREATININE 0.69  GLUCOSE 139*  CALCIUM 8.2*   No results for input(s): "LABPT", "INR" in the last 72 hours.  Sensation intact distally Intact pulses distally Dorsiflexion/Plantar flexion intact Incision: dressing C/D/I   Assessment/Plan: 1 Day Post-Op Procedure(s) (LRB): LEFT TOTAL HIP ARTHROPLASTY ANTERIOR APPROACH (Left) Up with therapy She had to defer therapy session this morning secondary to her nausea.  I will switch her to hydrocodone and see how she does.  Given the degree of her nausea and limited mobility, we will need to keep her here today so I will change her to an inpatient admission.     Kathryne Hitch 01/03/2023, 10:17 AM

## 2023-01-04 MED ORDER — METHOCARBAMOL 500 MG PO TABS: 500.0000 mg | ORAL_TABLET | Freq: Four times a day (QID) | ORAL | 0 refills | Status: DC | PRN

## 2023-01-04 MED ORDER — HYDROCODONE-ACETAMINOPHEN 5-325 MG PO TABS: 1.0000 | ORAL_TABLET | Freq: Four times a day (QID) | ORAL | 0 refills | Status: DC | PRN

## 2023-01-04 MED ORDER — ASPIRIN 81 MG PO CHEW: 81.0000 mg | CHEWABLE_TABLET | Freq: Two times a day (BID) | ORAL | 0 refills | Status: DC

## 2023-01-04 MED ORDER — METOCLOPRAMIDE HCL 10 MG PO TABS: 10.0000 mg | ORAL_TABLET | Freq: Three times a day (TID) | ORAL | 0 refills | Status: DC | PRN

## 2023-01-04 MED ORDER — ONDANSETRON 4 MG PO TBDP: 4.0000 mg | ORAL_TABLET | Freq: Three times a day (TID) | ORAL | 0 refills | Status: DC | PRN

## 2023-01-04 NOTE — Plan of Care (Signed)
  Problem: Coping: Goal: Level of anxiety will decrease Outcome: Progressing   Problem: Pain Managment: Goal: General experience of comfort will improve Outcome: Progressing   

## 2023-01-04 NOTE — Progress Notes (Signed)
Patient ID: Susan Woodward, female   DOB: [birth year], 67 y.o.   MRN: [id number] The patient looks better today and feels much better overall.  Her vital signs are stable.  She is tolerating pain with less nausea as well.  She is sitting up at the bedside right now with her husband.  We collectively feel that she can go home today.

## 2023-01-04 NOTE — Discharge Summary (Signed)
Patient ID: Susan Woodward MRN: [id number] DOB/AGE: [birth year] 66 y.o.  Admit date: 01/02/2023 Discharge date: 01/04/2023  Admission Diagnoses:  Principal Problem:   Unilateral primary osteoarthritis, left hip Active Problems:   Status post total replacement of left hip   Discharge Diagnoses:  Same  Past Medical History:  Diagnosis Date   GERD (gastroesophageal reflux disease)    Hypertension    Osteoporosis    PONV (postoperative nausea and vomiting)     Surgeries: Procedure(s): LEFT TOTAL HIP ARTHROPLASTY ANTERIOR APPROACH on 01/02/2023   Consultants:   Discharged Condition: Improved  Hospital Course: Susan Woodward is an 67 y.o. female who was admitted 01/02/2023 for operative treatment ofUnilateral primary osteoarthritis, left hip. Patient has severe unremitting pain that affects sleep, daily activities, and work/hobbies. After pre-op clearance the patient was taken to the operating room on 01/02/2023 and underwent  Procedure(s): LEFT TOTAL HIP ARTHROPLASTY ANTERIOR APPROACH.    Patient was given perioperative antibiotics:  Anti-infectives (From admission, onward)    Start     Dose/Rate Route Frequency Ordered Stop   01/02/23 2000  ceFAZolin (ANCEF) IVPB 1 g/50 mL premix        1 g 100 mL/hr over 30 Minutes Intravenous Every 6 hours 01/02/23 1641 01/03/23 0150   01/02/23 1045  ceFAZolin (ANCEF) IVPB 2g/100 mL premix        2 g 200 mL/hr over 30 Minutes Intravenous On call to O.R. 01/02/23 1039 01/02/23 1344        Patient was given sequential compression devices, early ambulation, and chemoprophylaxis to prevent DVT.  Patient benefited maximally from hospital stay and there were no complications.    Recent vital signs: Patient Vitals for the past 24 hrs:  BP Temp Temp src Pulse Resp SpO2  01/04/23 0450 139/77 98.7 F (37.1 C) -- 92 18 97 %  01/03/23 2056 131/64 99.2 F (37.3 C) Oral 97 17 96 %  01/03/23 1330 114/84 97.8 F (36.6 C) -- 78 18 99 %  01/03/23 0954  131/75 97.7 F (36.5 C) Oral 80 17 98 %     Recent laboratory studies:  Recent Labs    01/03/23 0337  WBC 10.8*  HGB 10.6*  HCT 32.8*  PLT 300  NA 130*  K 4.1  CL 98  CO2 23  BUN 16  CREATININE 0.69  GLUCOSE 139*  CALCIUM 8.2*     Discharge Medications:   Allergies as of 01/04/2023   No Known Allergies      Medication List     TAKE these medications    acetaminophen 650 MG CR tablet Commonly known as: TYLENOL Take 1,300 mg by mouth in the morning, at noon, and at bedtime.   aspirin 81 MG chewable tablet Chew 1 tablet (81 mg total) by mouth 2 (two) times daily.   CALCIUM 600+D3 PLUS MINERALS PO Take 1 tablet by mouth daily.   colestipol 1 g tablet Commonly known as: COLESTID Take 1 g by mouth daily.   denosumab 60 MG/ML Sosy injection Commonly known as: PROLIA Inject 60 mg into the skin every 6 (six) months.   HYDROcodone-acetaminophen 5-325 MG tablet Commonly known as: NORCO/VICODIN Take 1-2 tablets by mouth every 6 (six) hours as needed for moderate pain.   methocarbamol 500 MG tablet Commonly known as: ROBAXIN Take 1 tablet (500 mg total) by mouth every 6 (six) hours as needed for muscle spasms.   omeprazole 20 MG capsule Commonly known as: PRILOSEC Take 20 mg by mouth daily.  ondansetron 4 MG disintegrating tablet Commonly known as: ZOFRAN-ODT Take 1 tablet (4 mg total) by mouth every 8 (eight) hours as needed for nausea or vomiting.   TURMERIC PO Take 1,000 mg by mouth in the morning.   valsartan 80 MG tablet Commonly known as: DIOVAN Take 80 mg by mouth in the morning.               Durable Medical Equipment  (From admission, onward)           Start     Ordered   01/02/23 1642  DME 3 n 1  Once        01/02/23 1641   01/02/23 1642  DME Walker rolling  Once       Question Answer Comment  Walker: With 5 Inch Wheels   Patient needs a walker to treat with the following condition Status post total replacement of left hip       01/02/23 1641            Diagnostic Studies: DG Pelvis Portable  Result Date: 01/02/2023 CLINICAL DATA:  Left hip replacement. EXAM: PORTABLE PELVIS 1-2 VIEWS COMPARISON:  None Available. FINDINGS: Left hip arthroplasty in expected alignment. No periprosthetic lucency or fracture. Recent postsurgical change includes air and edema in the soft tissues. Lateral skin staples in place. IMPRESSION: Left hip arthroplasty without immediate postoperative complication. Electronically Signed   By: Narda Rutherford M.D.   On: 01/02/2023 15:51   DG HIP UNILAT WITH PELVIS 1V LEFT  Result Date: 01/02/2023 CLINICAL DATA:  Elective surgery. EXAM: DG HIP (WITH OR WITHOUT PELVIS) 1V*L* COMPARISON:  None Available. FINDINGS: Three fluoroscopic spot views of the pelvis and left hip obtained in the operating room. Images during hip arthroplasty. Fluoroscopy time 16 seconds. Dose 0.6946 mGy. IMPRESSION: Intraoperative fluoroscopy during left hip arthroplasty. Electronically Signed   By: Narda Rutherford M.D.   On: 01/02/2023 14:53   DG C-Arm 1-60 Min-No Report  Result Date: 01/02/2023 Fluoroscopy was utilized by the requesting physician.  No radiographic interpretation.    Disposition: Discharge disposition: 01-Home or Self Care          Follow-up Information     Kathryne Hitch, MD Follow up in 2 week(s).   Specialty: Orthopedic Surgery Contact information: [address] [place] [address] [phone]                  Signed: Kathryne Hitch 01/04/2023, 9:52 AM

## 2023-01-04 NOTE — Progress Notes (Signed)
PT  [place] NOTE  01/04/23 1200  PT Visit Information  Last PT Received On 01/04/23  Assistance Needed Pt progressing well with mobility, meeting goals and feels ready to d/c; pain controlled but continues to have nausea.    History of Present Illness 67 yo female presents to therapy s/p L THA, anterior approach on 01/02/2023 due to failure of conservative measures. Pt PMH includes but is not limited to: degenerative spondylolisthesis, spinal stenosis of lumbar region s/p lumbar fusion (2022), R hip trochanteric bursitis, GERD, HTN and R wrist sx.  Subjective Data  Patient Stated Goal to walk without pain  Precautions  Precautions Fall  Restrictions  Weight Bearing Restrictions No  Pain Assessment  Pain Assessment 0-10  Pain Score 3  Pain Location L hip  Pain Descriptors / Indicators Dull;Aching;Operative site guarding  Pain Intervention(s) Limited activity within patient's tolerance;Monitored during session;Premedicated before session  Cognition  Arousal/Alertness Awake/alert  Behavior During Therapy [facility] for tasks assessed/performed  Overall Cognitive Status Within Functional Limits for tasks assessed  Bed Mobility  General bed mobility comments in recliner  Transfers  Overall transfer level Needs assistance  Equipment used Rolling walker (2 wheels)  Transfers Sit to/from Stand  Sit to Stand Supervision;Modified independent (Device/Increase time)  General transfer comment self cues for hand placement and LLE position  Ambulation/Gait  Ambulation/Gait assistance Supervision  Gait Distance (Feet) 50 Feet  Assistive device Rolling walker (2 wheels)  Gait Pattern/deviations Step-to pattern;Antalgic;Decreased stance time - left  General Gait Details supevision  for safety, cues for sequence, trunk extension and RW position.  Stairs Yes  Stairs assistance Min guard;Min assist  Stair Management No rails;Step to pattern;With walker;Forwards  Number of Stairs 5 (x2)  General stair comments  cues for sequence and technique. husband present, able to return demo. good stability, no LOB  Balance  Sitting-balance support Feet supported  Sitting balance-Leahy Scale Good  Standing balance support During functional activity;Reliant on assistive device for balance  Standing balance-Leahy Scale Fair  PT - End of Session  Equipment Utilized During Treatment Gait belt  Activity Tolerance Patient tolerated treatment well  Patient left with call bell/phone within reach;in chair;with family/visitor present  Nurse Communication Mobility status   PT - Assessment/Plan  PT Plan Current plan remains appropriate  PT Visit Diagnosis Unsteadiness on feet (R26.81);Other abnormalities of gait and mobility (R26.89);Muscle weakness (generalized) (M62.81);Pain  Pain - Right/Left Left  Pain - part of body Hip  PT Frequency (ACUTE ONLY) 7X/week  Follow Up Recommendations Follow physician's recommendations for discharge plan and follow up therapies  Assistance recommended at discharge Intermittent Supervision/Assistance  Patient can return home with the following Assistance with cooking/housework;Assist for transportation;Help with stairs or ramp for entrance;A little help with bathing/dressing/bathroom  PT equipment None recommended by PT (pt reports DME in home setting)  AM-PAC PT "6 Clicks" Mobility Outcome Measure (Version 2)  Help needed turning from your back to your side while in a flat bed without using bedrails? 3  Help needed moving from lying on your back to sitting on the side of a flat bed without using bedrails? 4  Help needed moving to and from a bed to a chair (including a wheelchair)? 4  Help needed standing up from a chair using your arms (e.g., wheelchair or bedside chair)? 3  Help needed to walk in hospital room? 3  Help needed climbing 3-5 steps with a railing?  1  6 Click Score 18  Consider Recommendation of Discharge To: Home with [facility]  Progressive Mobility  What is the highest  level of mobility based on the progressive mobility assessment? Level 5 (Walks with assist in room/hall) - Balance while stepping forward/back and can walk in room with assist - Complete  Activity Turned to right side  PT Goal Progression  Progress towards PT goals Progressing toward goals  Acute Rehab PT Goals  PT Goal Formulation With patient  Time For Goal Achievement 01/16/23  Potential to Achieve Goals Good  PT Time Calculation  PT Start Time (ACUTE ONLY) 1241  PT Stop Time (ACUTE ONLY) 1252  PT Time Calculation (min) (ACUTE ONLY) 11 min  PT General Charges  $$ ACUTE PT VISIT 1 Visit  PT Treatments  $Gait Training 8-22 mins

## 2023-01-04 NOTE — TOC Initial Note (Signed)
Transition of Care [facility]) - Initial/Assessment Note    Patient Details  Name: Susan Woodward MRN: [id number] Date of Birth: November 24, 1955  [facility] CM/SW Contact:    Adrian Prows, RN Phone Number: 01/04/2023, 10:16 AM  Clinical Narrative:                 [facility] consult for HHPT; pt currently working w/ PT; spoke w husband Ceil Blauer [phone]); he says pt is from home and plans to return at d/c; he says pt has transportation; Mr Buckett denies pt experiences IPV, food/housing insecurity, or difficulty paying utilities; he says pt does not have glasses, dentures, or hearing aids; he says pt has walker and BSC; she doe not receive HH services or home oxygen; Mr Vanhuss does not have agency preference; spoke w/ Barbara Cower at [facility] and the can provide services; Barbara Cower request pt be given [employer] office # [phone]; office number # and agency placed in d/c instruction  Expected Discharge Plan: Home w Home Health Services Barriers to Discharge: No Barriers Identified   Patient Goals and CMS Choice Patient states their goals for this hospitalization and ongoing recovery are:: Home          Expected Discharge Plan and Services   Discharge Planning Services: CM Consult Post Acute Care Choice: Home Health Living arrangements for the past 2 months: Single Family Home Expected Discharge Date: 01/04/23                         HH Arranged: PT HH Agency: [facility] Date HH Agency Contacted: 01/04/23 Time HH Agency Contacted: 1012 Representative spoke with at [facility] Agency: Barbara Cower  Prior Living Arrangements/Services Living arrangements for the past 2 months: Single Family Home Lives with:: Spouse Patient language and need for interpreter reviewed:: Yes Do you feel safe going back to the place where you live?: Yes      Need for Family Participation in Patient Care: Yes (Comment) Care giver support system in place?: Yes (comment) Current home  services: DME (walker, BSC) Criminal Activity/Legal Involvement Pertinent to Current Situation/Hospitalization: No - Comment as needed  Activities of Daily Living Home Assistive Devices/Equipment: Eyeglasses, Blood pressure cuff, Bedside commode/3-in-1, Cane (specify quad or straight), Walker (specify type) ADL Screening (condition at time of admission) Patient's cognitive ability adequate to safely complete daily activities?: Yes Is the patient deaf or have difficulty hearing?: No Does the patient have difficulty seeing, even when wearing glasses/contacts?: Yes Does the patient have difficulty concentrating, remembering, or making decisions?: No Patient able to express need for assistance with ADLs?: Yes Does the patient have difficulty dressing or bathing?: No Independently performs ADLs?: Yes (appropriate for developmental age) Does the patient have difficulty walking or climbing stairs?: Yes Weakness of Legs: Left Weakness of Arms/Hands: None  Permission Sought/Granted Permission sought to share information with : Case Manager Permission granted to share information with : Yes, Release of Information Signed  Share Information with NAME: Case Manager     Permission granted to share info w Relationship: Chakakhan Hannegan (spouse) [phone]     Emotional Assessment Appearance:: Other (Comment Required (unable to assess) Attitude/Demeanor/Rapport: Gracious Affect (typically observed): Accepting Orientation: : Oriented to Self, Oriented to Place, Oriented to  Time Alcohol / Substance Use: Not Applicable Psych Involvement: No (comment)  Admission diagnosis:  Status post total replacement of left hip [Z96.642] Patient Active Problem List   Diagnosis Date Noted   Status post total replacement  of left hip 01/02/2023   Unilateral primary osteoarthritis, left hip 11/27/2022   Degenerative spondylolisthesis 08/26/2021   Spinal stenosis of lumbar region 07/13/2017   Trochanteric bursitis,  right hip 10/01/2016   PCP:  Soundra Pilon, FNP Pharmacy:   [facility] [phone] Ginette Otto, [place] - [address] [place] [address] Phone: [phone] Fax: [phone]     Social Determinants of Health (SDOH) Social History: SDOH Screenings   Food Insecurity: No Food Insecurity (01/04/2023)  Housing: Low Risk  (01/04/2023)  Transportation Needs: No Transportation Needs (01/04/2023)  Utilities: Not At Risk (01/04/2023)  Tobacco Use: Low Risk  (01/02/2023)   SDOH Interventions: Food Insecurity Interventions: Inpatient TOC Housing Interventions: Inpatient TOC Transportation Interventions: Inpatient TOC   Readmission Risk Interventions     No data to display

## 2023-01-04 NOTE — Plan of Care (Signed)
  Problem: Education: Goal: Knowledge of the prescribed therapeutic regimen will improve Outcome: Progressing   Problem: Pain Management: Goal: Pain level will decrease with appropriate interventions Outcome: Progressing   Problem: Activity: Goal: Risk for activity intolerance will decrease Outcome: Progressing   

## 2023-01-04 NOTE — Progress Notes (Signed)
Physical Therapy Treatment Patient Details Name: Susan Woodward MRN: [id number] DOB: 10/04/1955 Today's Date: 01/04/2023   History of Present Illness 67 yo female presents to therapy s/p L THA, anterior approach on 01/02/2023 due to failure of conservative measures. Pt PMH includes but is not limited to: degenerative spondylolisthesis, spinal stenosis of lumbar region s/p lumbar fusion (2022), R hip trochanteric bursitis, GERD, HTN and R wrist sx.    PT Comments    Pt progressing well, incr gait distance somewhat limited by nausea, motivated to mobilize. Pt planning for d/c later today, will see again to review stairs   Recommendations for follow up therapy are one component of a multi-disciplinary discharge planning process, led by the attending physician.  Recommendations may be updated based on patient status, additional functional criteria and insurance authorization.  Follow Up Recommendations       Assistance Recommended at Discharge Intermittent Supervision/Assistance  Patient can return home with the following Assistance with cooking/housework;Assist for transportation;Help with stairs or ramp for entrance;A little help with bathing/dressing/bathroom   Equipment Recommendations  None recommended by PT (pt reports DME in home setting)    Recommendations for Other Services       Precautions / Restrictions Precautions Precautions: Fall Restrictions Weight Bearing Restrictions: No     Mobility  Bed Mobility Overal bed mobility: Needs Assistance Bed Mobility: Supine to Sit, Sit to Supine     Supine to sit: Supervision     General bed mobility comments: for safety    Transfers Overall transfer level: Needs assistance Equipment used: Rolling walker (2 wheels) Transfers: Sit to/from Stand Sit to Stand: Supervision           General transfer comment: cues for hand placement and LLE position, self cues end of session    Ambulation/Gait Ambulation/Gait assistance:  Supervision Gait Distance (Feet): 50 Feet Assistive device: Rolling walker (2 wheels) Gait Pattern/deviations: Step-to pattern, Antalgic, Decreased stance time - left       General Gait Details: supevision  for safety, cues for sequence and RW position.   Stairs             Wheelchair Mobility    Modified Rankin (Stroke Patients Only)       Balance   Sitting-balance support: Feet supported Sitting balance-Leahy Scale: Good     Standing balance support: During functional activity, Reliant on assistive device for balance Standing balance-Leahy Scale: Fair                              Cognition Arousal/Alertness: Awake/alert Behavior During Therapy: WFL for tasks assessed/performed Overall Cognitive Status: Within Functional Limits for tasks assessed                                          Exercises      General Comments        Pertinent Vitals/Pain Pain Assessment Pain Assessment: 0-10 Pain Score: 3  Pain Location: L hip Pain Descriptors / Indicators: Dull, Aching, Operative site guarding Pain Intervention(s): Limited activity within patient's tolerance, Monitored during session, Premedicated before session, Repositioned    Home Living                          Prior Function            PT  Goals (current goals can now be found in the care plan section) Acute Rehab PT Goals Patient Stated Goal: to walk without pain PT Goal Formulation: With patient Time For Goal Achievement: 01/16/23 Potential to Achieve Goals: Good Progress towards PT goals: Progressing toward goals    Frequency    7X/week      PT Plan Current plan remains appropriate    Co-evaluation              AM-PAC PT "6 Clicks" Mobility   Outcome Measure  Help needed turning from your back to your side while in a flat bed without using bedrails?: A Little Help needed moving from lying on your back to sitting on the side of a flat  bed without using bedrails?: A Little Help needed moving to and from a bed to a chair (including a wheelchair)?: A Little Help needed standing up from a chair using your arms (e.g., wheelchair or bedside chair)?: A Little Help needed to walk in hospital room?: A Little Help needed climbing 3-5 steps with a railing? : Total 6 Click Score: 16    End of Session Equipment Utilized During Treatment: Gait belt Activity Tolerance: Patient tolerated treatment well Patient left: with call bell/phone within reach;in chair;with family/visitor present Nurse Communication: Mobility status;Patient requests pain meds PT Visit Diagnosis: Unsteadiness on feet (R26.81);Other abnormalities of gait and mobility (R26.89);Muscle weakness (generalized) (M62.81);Pain Pain - Right/Left: Left Pain - part of body: Hip     Time: 1610-9604 PT Time Calculation (min) (ACUTE ONLY): 13 min  Charges:  $Gait Training: 8-22 mins                     Delice Bison, PT  Acute Rehab Dept ([facility]) [phone]  01/04/2023    [facility] 01/04/2023, 10:16 AM

## 2023-01-05 ENCOUNTER — Encounter (HOSPITAL_COMMUNITY): Payer: Self-pay | Admitting: Orthopaedic Surgery

## 2023-01-05 ENCOUNTER — Telehealth: Payer: Self-pay | Admitting: Orthopaedic Surgery

## 2023-01-05 DIAGNOSIS — Z96642 Presence of left artificial hip joint: Secondary | ICD-10-CM | POA: Diagnosis not present

## 2023-01-05 DIAGNOSIS — M4310 Spondylolisthesis, site unspecified: Secondary | ICD-10-CM | POA: Diagnosis not present

## 2023-01-05 DIAGNOSIS — M810 Age-related osteoporosis without current pathological fracture: Secondary | ICD-10-CM | POA: Diagnosis not present

## 2023-01-05 DIAGNOSIS — M48061 Spinal stenosis, lumbar region without neurogenic claudication: Secondary | ICD-10-CM | POA: Diagnosis not present

## 2023-01-05 DIAGNOSIS — M7061 Trochanteric bursitis, right hip: Secondary | ICD-10-CM | POA: Diagnosis not present

## 2023-01-05 DIAGNOSIS — Z7982 Long term (current) use of aspirin: Secondary | ICD-10-CM | POA: Diagnosis not present

## 2023-01-05 DIAGNOSIS — E785 Hyperlipidemia, unspecified: Secondary | ICD-10-CM | POA: Diagnosis not present

## 2023-01-05 DIAGNOSIS — Z9181 History of falling: Secondary | ICD-10-CM | POA: Diagnosis not present

## 2023-01-05 DIAGNOSIS — I10 Essential (primary) hypertension: Secondary | ICD-10-CM | POA: Diagnosis not present

## 2023-01-05 DIAGNOSIS — K219 Gastro-esophageal reflux disease without esophagitis: Secondary | ICD-10-CM | POA: Diagnosis not present

## 2023-01-05 DIAGNOSIS — Z471 Aftercare following joint replacement surgery: Secondary | ICD-10-CM | POA: Diagnosis not present

## 2023-01-05 DIAGNOSIS — G2581 Restless legs syndrome: Secondary | ICD-10-CM | POA: Diagnosis not present

## 2023-01-05 NOTE — Telephone Encounter (Signed)
Lvm giving verbal ok 

## 2023-01-05 NOTE — Telephone Encounter (Signed)
Received call from Kelly-(PT) with [facility] needing verbal orders for HHPT 2 WK 3. The number to contact Tresa Endo is [phone]

## 2023-01-08 DIAGNOSIS — E785 Hyperlipidemia, unspecified: Secondary | ICD-10-CM | POA: Diagnosis not present

## 2023-01-08 DIAGNOSIS — I10 Essential (primary) hypertension: Secondary | ICD-10-CM | POA: Diagnosis not present

## 2023-01-08 DIAGNOSIS — Z96642 Presence of left artificial hip joint: Secondary | ICD-10-CM | POA: Diagnosis not present

## 2023-01-08 DIAGNOSIS — M7061 Trochanteric bursitis, right hip: Secondary | ICD-10-CM | POA: Diagnosis not present

## 2023-01-08 DIAGNOSIS — M4310 Spondylolisthesis, site unspecified: Secondary | ICD-10-CM | POA: Diagnosis not present

## 2023-01-08 DIAGNOSIS — Z471 Aftercare following joint replacement surgery: Secondary | ICD-10-CM | POA: Diagnosis not present

## 2023-01-08 DIAGNOSIS — Z9181 History of falling: Secondary | ICD-10-CM | POA: Diagnosis not present

## 2023-01-08 DIAGNOSIS — G2581 Restless legs syndrome: Secondary | ICD-10-CM | POA: Diagnosis not present

## 2023-01-08 DIAGNOSIS — M48061 Spinal stenosis, lumbar region without neurogenic claudication: Secondary | ICD-10-CM | POA: Diagnosis not present

## 2023-01-08 DIAGNOSIS — M810 Age-related osteoporosis without current pathological fracture: Secondary | ICD-10-CM | POA: Diagnosis not present

## 2023-01-08 DIAGNOSIS — Z7982 Long term (current) use of aspirin: Secondary | ICD-10-CM | POA: Diagnosis not present

## 2023-01-08 DIAGNOSIS — K219 Gastro-esophageal reflux disease without esophagitis: Secondary | ICD-10-CM | POA: Diagnosis not present

## 2023-01-12 DIAGNOSIS — E785 Hyperlipidemia, unspecified: Secondary | ICD-10-CM | POA: Diagnosis not present

## 2023-01-12 DIAGNOSIS — I10 Essential (primary) hypertension: Secondary | ICD-10-CM | POA: Diagnosis not present

## 2023-01-12 DIAGNOSIS — M4310 Spondylolisthesis, site unspecified: Secondary | ICD-10-CM | POA: Diagnosis not present

## 2023-01-12 DIAGNOSIS — M48061 Spinal stenosis, lumbar region without neurogenic claudication: Secondary | ICD-10-CM | POA: Diagnosis not present

## 2023-01-12 DIAGNOSIS — Z96642 Presence of left artificial hip joint: Secondary | ICD-10-CM | POA: Diagnosis not present

## 2023-01-12 DIAGNOSIS — Z471 Aftercare following joint replacement surgery: Secondary | ICD-10-CM | POA: Diagnosis not present

## 2023-01-12 DIAGNOSIS — G2581 Restless legs syndrome: Secondary | ICD-10-CM | POA: Diagnosis not present

## 2023-01-12 DIAGNOSIS — Z9181 History of falling: Secondary | ICD-10-CM | POA: Diagnosis not present

## 2023-01-12 DIAGNOSIS — Z7982 Long term (current) use of aspirin: Secondary | ICD-10-CM | POA: Diagnosis not present

## 2023-01-12 DIAGNOSIS — K219 Gastro-esophageal reflux disease without esophagitis: Secondary | ICD-10-CM | POA: Diagnosis not present

## 2023-01-12 DIAGNOSIS — M810 Age-related osteoporosis without current pathological fracture: Secondary | ICD-10-CM | POA: Diagnosis not present

## 2023-01-12 DIAGNOSIS — M7061 Trochanteric bursitis, right hip: Secondary | ICD-10-CM | POA: Diagnosis not present

## 2023-01-14 DIAGNOSIS — M810 Age-related osteoporosis without current pathological fracture: Secondary | ICD-10-CM | POA: Diagnosis not present

## 2023-01-14 DIAGNOSIS — I10 Essential (primary) hypertension: Secondary | ICD-10-CM | POA: Diagnosis not present

## 2023-01-14 DIAGNOSIS — Z9181 History of falling: Secondary | ICD-10-CM | POA: Diagnosis not present

## 2023-01-14 DIAGNOSIS — M48061 Spinal stenosis, lumbar region without neurogenic claudication: Secondary | ICD-10-CM | POA: Diagnosis not present

## 2023-01-14 DIAGNOSIS — M4310 Spondylolisthesis, site unspecified: Secondary | ICD-10-CM | POA: Diagnosis not present

## 2023-01-14 DIAGNOSIS — Z471 Aftercare following joint replacement surgery: Secondary | ICD-10-CM | POA: Diagnosis not present

## 2023-01-14 DIAGNOSIS — G2581 Restless legs syndrome: Secondary | ICD-10-CM | POA: Diagnosis not present

## 2023-01-14 DIAGNOSIS — Z7982 Long term (current) use of aspirin: Secondary | ICD-10-CM | POA: Diagnosis not present

## 2023-01-14 DIAGNOSIS — K219 Gastro-esophageal reflux disease without esophagitis: Secondary | ICD-10-CM | POA: Diagnosis not present

## 2023-01-14 DIAGNOSIS — M7061 Trochanteric bursitis, right hip: Secondary | ICD-10-CM | POA: Diagnosis not present

## 2023-01-14 DIAGNOSIS — E785 Hyperlipidemia, unspecified: Secondary | ICD-10-CM | POA: Diagnosis not present

## 2023-01-14 DIAGNOSIS — Z96642 Presence of left artificial hip joint: Secondary | ICD-10-CM | POA: Diagnosis not present

## 2023-01-15 ENCOUNTER — Ambulatory Visit (INDEPENDENT_AMBULATORY_CARE_PROVIDER_SITE_OTHER): Payer: Medicare Other | Admitting: Orthopaedic Surgery

## 2023-01-15 DIAGNOSIS — Z96642 Presence of left artificial hip joint: Secondary | ICD-10-CM

## 2023-01-15 NOTE — Progress Notes (Signed)
The patient is here for her first postoperative visit status post a left total hip replacement.  She is doing very well in terms of her mobility and getting around without any assistive device at all.  She is not taking any pain medications now and has been compliant with a baby aspirin twice daily.  On exam she is walking with just a minimal limp.  Her calf is soft flat left side.  Her incision looks good and the staples are removed and Steri-Strips applied.  I see no worrisome features today at all.  She will continue to increase her activities but does not need to do any exercises other than just walking.  Will see her back in 4 weeks to see how she is doing overall but no x-rays are needed.

## 2023-01-19 DIAGNOSIS — M7061 Trochanteric bursitis, right hip: Secondary | ICD-10-CM | POA: Diagnosis not present

## 2023-01-19 DIAGNOSIS — M48061 Spinal stenosis, lumbar region without neurogenic claudication: Secondary | ICD-10-CM | POA: Diagnosis not present

## 2023-01-19 DIAGNOSIS — Z7982 Long term (current) use of aspirin: Secondary | ICD-10-CM | POA: Diagnosis not present

## 2023-01-19 DIAGNOSIS — K219 Gastro-esophageal reflux disease without esophagitis: Secondary | ICD-10-CM | POA: Diagnosis not present

## 2023-01-19 DIAGNOSIS — Z96642 Presence of left artificial hip joint: Secondary | ICD-10-CM | POA: Diagnosis not present

## 2023-01-19 DIAGNOSIS — M810 Age-related osteoporosis without current pathological fracture: Secondary | ICD-10-CM | POA: Diagnosis not present

## 2023-01-19 DIAGNOSIS — I10 Essential (primary) hypertension: Secondary | ICD-10-CM | POA: Diagnosis not present

## 2023-01-19 DIAGNOSIS — Z471 Aftercare following joint replacement surgery: Secondary | ICD-10-CM | POA: Diagnosis not present

## 2023-01-19 DIAGNOSIS — M4310 Spondylolisthesis, site unspecified: Secondary | ICD-10-CM | POA: Diagnosis not present

## 2023-01-19 DIAGNOSIS — Z9181 History of falling: Secondary | ICD-10-CM | POA: Diagnosis not present

## 2023-01-19 DIAGNOSIS — G2581 Restless legs syndrome: Secondary | ICD-10-CM | POA: Diagnosis not present

## 2023-01-19 DIAGNOSIS — E785 Hyperlipidemia, unspecified: Secondary | ICD-10-CM | POA: Diagnosis not present

## 2023-01-21 DIAGNOSIS — M7061 Trochanteric bursitis, right hip: Secondary | ICD-10-CM | POA: Diagnosis not present

## 2023-01-21 DIAGNOSIS — Z96642 Presence of left artificial hip joint: Secondary | ICD-10-CM | POA: Diagnosis not present

## 2023-01-21 DIAGNOSIS — Z7982 Long term (current) use of aspirin: Secondary | ICD-10-CM | POA: Diagnosis not present

## 2023-01-21 DIAGNOSIS — K219 Gastro-esophageal reflux disease without esophagitis: Secondary | ICD-10-CM | POA: Diagnosis not present

## 2023-01-21 DIAGNOSIS — E785 Hyperlipidemia, unspecified: Secondary | ICD-10-CM | POA: Diagnosis not present

## 2023-01-21 DIAGNOSIS — I10 Essential (primary) hypertension: Secondary | ICD-10-CM | POA: Diagnosis not present

## 2023-01-21 DIAGNOSIS — M810 Age-related osteoporosis without current pathological fracture: Secondary | ICD-10-CM | POA: Diagnosis not present

## 2023-01-21 DIAGNOSIS — Z9181 History of falling: Secondary | ICD-10-CM | POA: Diagnosis not present

## 2023-01-21 DIAGNOSIS — Z471 Aftercare following joint replacement surgery: Secondary | ICD-10-CM | POA: Diagnosis not present

## 2023-01-21 DIAGNOSIS — M48061 Spinal stenosis, lumbar region without neurogenic claudication: Secondary | ICD-10-CM | POA: Diagnosis not present

## 2023-01-21 DIAGNOSIS — G2581 Restless legs syndrome: Secondary | ICD-10-CM | POA: Diagnosis not present

## 2023-01-21 DIAGNOSIS — M4310 Spondylolisthesis, site unspecified: Secondary | ICD-10-CM | POA: Diagnosis not present

## 2023-02-02 ENCOUNTER — Encounter: Payer: Medicare Other | Admitting: Orthopaedic Surgery

## 2023-02-10 ENCOUNTER — Other Ambulatory Visit: Payer: Self-pay | Admitting: Orthopaedic Surgery

## 2023-02-10 ENCOUNTER — Telehealth: Payer: Self-pay | Admitting: Orthopaedic Surgery

## 2023-02-10 ENCOUNTER — Encounter: Payer: Self-pay | Admitting: Orthopaedic Surgery

## 2023-02-10 MED ORDER — MUPIROCIN 2 % EX OINT: 1.0000 | TOPICAL_OINTMENT | Freq: Every day | CUTANEOUS | 0 refills | Status: DC

## 2023-02-10 MED ORDER — DOXYCYCLINE HYCLATE 100 MG PO TABS: 100.0000 mg | ORAL_TABLET | Freq: Two times a day (BID) | ORAL | 0 refills | Status: DC

## 2023-02-10 NOTE — Telephone Encounter (Signed)
Patient called advised her incision site has a raised place that is red,sore and may be infected. The number to contact patient is [phone]

## 2023-02-10 NOTE — Telephone Encounter (Signed)
I called. Pt will send a picture of the incision so I can forward it to Dr. Magnus Ivan

## 2023-02-12 ENCOUNTER — Ambulatory Visit (INDEPENDENT_AMBULATORY_CARE_PROVIDER_SITE_OTHER): Payer: Medicare Other | Admitting: Physician Assistant

## 2023-02-12 ENCOUNTER — Encounter: Payer: Self-pay | Admitting: Physician Assistant

## 2023-02-12 DIAGNOSIS — Z96642 Presence of left artificial hip joint: Secondary | ICD-10-CM

## 2023-02-12 NOTE — Progress Notes (Signed)
HPI: Mr. Susan Woodward returns today status post left total hip arthroplasty 6 and 24.  She states that on 02/08/2023 she had an area that became red and then burst open.  He states the drainage from the incision site was clear.  She has had chills but no fevers.  Otherwise left hip is overall doing well.  She was put empirically on doxycycline.  She is also given mupirocin to apply to the wound.  Physical exam: General well-developed well-nourished female who ambulates without any assistive device.  Mood and affect appropriate. Left hip good range of motion without pain.  Surgical incisions shows.  Erythema mid incision.  There is slight serosanguineous drainage.  Superficial Vicryl stitches removed.  There is 2 other superficial Vicryl stitches at the proximal incision but no evidence of erythema or infection.  Impression: Status post left total hip arthroplasty  Plan: Stitches were removed.  Small amount of mupirocin was applied over the mid incision.  With a Band-Aid.  She will wash the area with an antibacterial soap daily and then apply a small amount of mupirocin.  She will continue the doxycycline.  Will see her back in 1 week for wound check.  Sooner if there is any questions or concerns.  Questions were encouraged and answered by Dr. Magnus Ivan myself.

## 2023-02-13 ENCOUNTER — Ambulatory Visit: Payer: Medicare Other | Admitting: Orthopedic Surgery

## 2023-02-13 DIAGNOSIS — Z96642 Presence of left artificial hip joint: Secondary | ICD-10-CM

## 2023-02-16 ENCOUNTER — Encounter: Payer: Medicare Other | Admitting: Orthopaedic Surgery

## 2023-02-18 ENCOUNTER — Encounter: Payer: Medicare Other | Admitting: Orthopaedic Surgery

## 2023-02-19 ENCOUNTER — Encounter: Payer: Medicare Other | Admitting: Physician Assistant

## 2023-02-23 ENCOUNTER — Encounter: Payer: Self-pay | Admitting: Orthopaedic Surgery

## 2023-02-23 ENCOUNTER — Ambulatory Visit (INDEPENDENT_AMBULATORY_CARE_PROVIDER_SITE_OTHER): Payer: Medicare Other | Admitting: Orthopaedic Surgery

## 2023-02-23 DIAGNOSIS — Z96642 Presence of left artificial hip joint: Secondary | ICD-10-CM

## 2023-02-23 NOTE — Progress Notes (Signed)
The patient is now 7 weeks status post a left total hip arthroplasty.  We have been following her closely due to a suture abscess on that left hip incision.  She had to stop doxycycline after 7 days because her "skin was on fire".  She is someone to get help side quite a bit and is very tanned.  This is really helped with her vitamin D.  She says that things are looking better for her.  She still has some discomfort in the groin but she looks much better overall mobilizing.  She is an active and young appearing 67 year old female.  Her left operative hip moves smoothly and fluidly.  Her incision does look good.  There is no worrisome features at all.  I did unroofed a small scab.  From my standpoint the next time we need to see is not for 3 months unless there are any issues.  At that visit we will have a standing low AP pelvis and lateral of her left operative hip.  I let her know that it is certainly normal to still be hurting in the groin area and that should subside with time.

## 2023-02-25 DIAGNOSIS — L568 Other specified acute skin changes due to ultraviolet radiation: Secondary | ICD-10-CM | POA: Diagnosis not present

## 2023-03-10 ENCOUNTER — Encounter: Payer: Self-pay | Admitting: Orthopedic Surgery

## 2023-03-10 DIAGNOSIS — H2513 Age-related nuclear cataract, bilateral: Secondary | ICD-10-CM | POA: Diagnosis not present

## 2023-03-10 DIAGNOSIS — H25043 Posterior subcapsular polar age-related cataract, bilateral: Secondary | ICD-10-CM | POA: Diagnosis not present

## 2023-03-10 DIAGNOSIS — H2512 Age-related nuclear cataract, left eye: Secondary | ICD-10-CM | POA: Diagnosis not present

## 2023-03-10 DIAGNOSIS — H18413 Arcus senilis, bilateral: Secondary | ICD-10-CM | POA: Diagnosis not present

## 2023-03-10 DIAGNOSIS — H25013 Cortical age-related cataract, bilateral: Secondary | ICD-10-CM | POA: Diagnosis not present

## 2023-03-10 NOTE — Progress Notes (Signed)
Patient is status post total hip arthroplasty.  She was seen by myself and Dr. Magnus Ivan.  Surgical incision was well-approximated no signs of infection.  Follow-up as scheduled.

## 2023-03-19 DIAGNOSIS — E7800 Pure hypercholesterolemia, unspecified: Secondary | ICD-10-CM | POA: Diagnosis not present

## 2023-03-19 DIAGNOSIS — K219 Gastro-esophageal reflux disease without esophagitis: Secondary | ICD-10-CM | POA: Diagnosis not present

## 2023-03-19 DIAGNOSIS — Z0000 Encounter for general adult medical examination without abnormal findings: Secondary | ICD-10-CM | POA: Diagnosis not present

## 2023-03-19 DIAGNOSIS — I10 Essential (primary) hypertension: Secondary | ICD-10-CM | POA: Diagnosis not present

## 2023-03-19 DIAGNOSIS — M810 Age-related osteoporosis without current pathological fracture: Secondary | ICD-10-CM | POA: Diagnosis not present

## 2023-05-18 DIAGNOSIS — Z9889 Other specified postprocedural states: Secondary | ICD-10-CM | POA: Diagnosis not present

## 2023-05-18 DIAGNOSIS — H2512 Age-related nuclear cataract, left eye: Secondary | ICD-10-CM | POA: Diagnosis not present

## 2023-05-18 DIAGNOSIS — H2513 Age-related nuclear cataract, bilateral: Secondary | ICD-10-CM | POA: Diagnosis not present

## 2023-05-18 DIAGNOSIS — Z961 Presence of intraocular lens: Secondary | ICD-10-CM | POA: Diagnosis not present

## 2023-05-19 DIAGNOSIS — H2511 Age-related nuclear cataract, right eye: Secondary | ICD-10-CM | POA: Diagnosis not present

## 2023-05-25 ENCOUNTER — Ambulatory Visit: Payer: Medicare Other | Admitting: Orthopaedic Surgery

## 2023-05-25 ENCOUNTER — Encounter: Payer: Self-pay | Admitting: Orthopaedic Surgery

## 2023-05-25 ENCOUNTER — Other Ambulatory Visit (INDEPENDENT_AMBULATORY_CARE_PROVIDER_SITE_OTHER): Payer: Self-pay

## 2023-05-25 ENCOUNTER — Other Ambulatory Visit (INDEPENDENT_AMBULATORY_CARE_PROVIDER_SITE_OTHER): Payer: Medicare Other

## 2023-05-25 DIAGNOSIS — Z96642 Presence of left artificial hip joint: Secondary | ICD-10-CM | POA: Diagnosis not present

## 2023-05-25 DIAGNOSIS — G8929 Other chronic pain: Secondary | ICD-10-CM

## 2023-05-25 DIAGNOSIS — M25561 Pain in right knee: Secondary | ICD-10-CM | POA: Diagnosis not present

## 2023-05-25 MED ORDER — LIDOCAINE HCL 1 % IJ SOLN
3.0000 mL | INTRAMUSCULAR | Status: AC | PRN
Start: 2023-05-25 — End: 2023-05-25
  Administered 2023-05-25: 3 mL

## 2023-05-25 MED ORDER — METHYLPREDNISOLONE ACETATE 40 MG/ML IJ SUSP
40.0000 mg | INTRAMUSCULAR | Status: AC | PRN
Start: 2023-05-25 — End: 2023-05-25
  Administered 2023-05-25: 40 mg via INTRA_ARTICULAR

## 2023-05-25 NOTE — Progress Notes (Signed)
The patient is a 67 year old who is getting close to 5 months status post a left total hip arthroplasty.  She does get occasional muscle pain in the hip but overall is making good progress.  She does have right knee pain with a catching sensation and a crunching sensation that she reports in her right knee with no known injury.  She is never had surgery on her knees.  On exam her left hip moves smoothly and fluidly with just minimal pain.  Her right hip has some pain but this is minimal.  There is no blocks or rotation of her right hip.  Her right knee is what is causing more of the right hip pain.  There is definitely patellofemoral crepitation and pain throughout the arc of motion of that knee but her knee range of motion is excellent and there is no effusion.  Her left knee also has some patellofemoral crepitation but is not symptomatic like the right knee.  An AP pelvis and lateral the left hip shows a well-seated total hip arthroplasty on the left side.  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  X-rays of her right knee show mild arthritic changes with calcifications along the meniscus on both sides as well as osteophytes around the knee.  The AP view also shows the left knee with similar arthritic findings.  We did talk about trying a steroid injection in her right knee which she agreed to and tolerated well.  She may be a candidate for hyaluronic acid at some point.  She is already thin individual.  She exercises regularly.  She has been work on Dance movement psychotherapist.  All questions concerns were answered and addressed.  Will see how she is doing in 4 weeks but no x-rays are needed.  That is when we can consider if she is a candidate for hyaluronic acid.    Procedure Note  Patient: Susan Woodward             Date of Birth: 08/30/1955           MRN: [id number]             Visit Date: 05/25/2023  Procedures: Visit Diagnoses:  1. Status post total replacement of left hip   2. Chronic pain of  right knee     Large Joint Inj: L knee on 05/25/2023 8:56 AM Indications: diagnostic evaluation and pain Details: 22 G 1.5 in needle, superolateral approach  Arthrogram: No  Medications: 3 mL lidocaine 1 %; 40 mg methylPREDNISolone acetate 40 MG/ML Outcome: tolerated well, no immediate complications Procedure, treatment alternatives, risks and benefits explained, specific risks discussed. Consent was given by the patient. Immediately prior to procedure a time out was called to verify the correct patient, procedure, equipment, support staff and site/side marked as required. Patient was prepped and draped in the usual sterile fashion.

## 2023-06-01 DIAGNOSIS — M810 Age-related osteoporosis without current pathological fracture: Secondary | ICD-10-CM | POA: Diagnosis not present

## 2023-06-08 DIAGNOSIS — Z961 Presence of intraocular lens: Secondary | ICD-10-CM | POA: Diagnosis not present

## 2023-06-08 DIAGNOSIS — H2513 Age-related nuclear cataract, bilateral: Secondary | ICD-10-CM | POA: Diagnosis not present

## 2023-06-08 DIAGNOSIS — H2511 Age-related nuclear cataract, right eye: Secondary | ICD-10-CM | POA: Diagnosis not present

## 2023-06-22 ENCOUNTER — Other Ambulatory Visit: Payer: Self-pay

## 2023-06-22 ENCOUNTER — Encounter: Payer: Self-pay | Admitting: Orthopaedic Surgery

## 2023-06-22 ENCOUNTER — Ambulatory Visit: Payer: Medicare Other | Admitting: Orthopaedic Surgery

## 2023-06-22 DIAGNOSIS — Z96642 Presence of left artificial hip joint: Secondary | ICD-10-CM

## 2023-06-22 DIAGNOSIS — G8929 Other chronic pain: Secondary | ICD-10-CM | POA: Diagnosis not present

## 2023-06-22 DIAGNOSIS — M25561 Pain in right knee: Secondary | ICD-10-CM | POA: Diagnosis not present

## 2023-06-22 NOTE — Progress Notes (Signed)
The patient is an active and petite 67 year old female who is almost 6 months out from a left direct anterior total hip replacement.  We actually saw her last month and x-rayed the pelvis and left hip and the implant looks good.  However we have been following her for right knee pain with locking and catching.  We did place a steroid injection last month in her right knee and she is work on a Technical sales engineer.  She said the injection only helped for just a few days.  She still has a lot of locking and catching with her right knee and pivoting activities is causing significant pain.  She is also developed knee swelling.  Her previous x-rays of the right knee show just some mild arthritic findings.  On my exam today she does have some slight valgus malalignment of that knee on the right side.  There is a mild effusion today as well.  She has a positive McMurray's exam to the lateral compartment.  She does have a lot of guarding with flexion extension of her right knee as well.  Given the continued mechanical symptoms combined with the failure of conservative treatment, a MRI of the right knee is warranted to assess the cartilage mainly in the lateral compartment and to assess for meniscal tear giving her continued mechanical symptoms.  We will see her back in follow-up once we have this MRI.  All question concerns were answered addressed.

## 2023-06-28 ENCOUNTER — Ambulatory Visit
Admission: RE | Admit: 2023-06-28 | Discharge: 2023-06-28 | Disposition: A | Discharge status: Home / Self Care | Payer: Medicare Other | Source: Ambulatory Visit | Attending: Orthopaedic Surgery | Admitting: Orthopaedic Surgery

## 2023-06-28 DIAGNOSIS — M25461 Effusion, right knee: Secondary | ICD-10-CM | POA: Diagnosis not present

## 2023-06-28 DIAGNOSIS — M25561 Pain in right knee: Secondary | ICD-10-CM | POA: Diagnosis not present

## 2023-06-28 DIAGNOSIS — M23331 Other meniscus derangements, other medial meniscus, right knee: Secondary | ICD-10-CM | POA: Diagnosis not present

## 2023-06-28 DIAGNOSIS — G8929 Other chronic pain: Secondary | ICD-10-CM | POA: Diagnosis not present

## 2023-07-01 DIAGNOSIS — M810 Age-related osteoporosis without current pathological fracture: Secondary | ICD-10-CM | POA: Diagnosis not present

## 2023-07-01 DIAGNOSIS — M8588 Other specified disorders of bone density and structure, other site: Secondary | ICD-10-CM | POA: Diagnosis not present

## 2023-07-08 ENCOUNTER — Encounter: Payer: Self-pay | Admitting: Orthopaedic Surgery

## 2023-07-08 ENCOUNTER — Ambulatory Visit: Payer: Medicare Other | Admitting: Orthopaedic Surgery

## 2023-07-08 DIAGNOSIS — M25561 Pain in right knee: Secondary | ICD-10-CM | POA: Diagnosis not present

## 2023-07-08 DIAGNOSIS — M1711 Unilateral primary osteoarthritis, right knee: Secondary | ICD-10-CM | POA: Diagnosis not present

## 2023-07-08 DIAGNOSIS — G8929 Other chronic pain: Secondary | ICD-10-CM | POA: Diagnosis not present

## 2023-07-08 NOTE — Progress Notes (Signed)
The patient is a 67 year old female who comes in today to go over a MRI of her right knee.  We have actually replaced her left hip back in June of this year.  She has been having chronic right knee issues for a very long period of time.  She continues to have right knee pain.  She is an athletic and thin individual and works on a regular basis.  At this point her right knee pain is daily and it is detrimentally affecting her mobility, her quality of life, and her actives daily living.  She has actually been through physical therapy formally for quad strengthening exercises.  She has had injections in her right knee and has been on anti-inflammatories.  She has failed conservative treatment.  We did send her for MRI of her right knee to get a really good picture of what the cartilage looks like in her right knee.  Examination of her right knee today shows posterior medial and posterior lateral joint tenderness.  There is a McMurray's exam to both the medial and lateral compartments.  There is a lot of patellofemoral crepitation and pain throughout the arc of motion of her knee.  Fortunately the range of motion is full of her right knee.  The MRI of her right knee shows areas of full-thickness cartilage loss of the medial and lateral compartments of the weightbearing surface as well as the patellofemoral joint.  There is also significant tearing of the medial and lateral meniscus and a Baker's cyst.  At this point there is no further conservative treatment that will be helpful.  Even further physical therapy or injections in the knee will not be helpful given the amount of arthritis in her right knee.  I did talk to her about knee replacement surgery.  We went over the MRI findings and I showed her knee replacement model and described what to expect from an intraoperative and postoperative standpoint.  I discussed the risks and benefits of the surgery as well.  She is interested in getting on her schedule to  have her right knee replaced.  She is otherwise a very healthy individual.  I did review again her medications and past medical history within epic.  She is thin and not on blood thinning medication and not a diabetic.  She is not a smoker either.  We will be in touch with scheduling her for a right total knee arthroplasty.  All questions and concerns were answered and addressed.

## 2023-07-15 ENCOUNTER — Ambulatory Visit: Payer: Medicare Other | Admitting: Orthopaedic Surgery

## 2023-07-16 DIAGNOSIS — H43393 Other vitreous opacities, bilateral: Secondary | ICD-10-CM | POA: Diagnosis not present

## 2023-08-06 ENCOUNTER — Other Ambulatory Visit: Payer: Self-pay

## 2023-08-07 NOTE — Patient Instructions (Addendum)
 SURGICAL WAITING ROOM VISITATION Patients having surgery or a procedure may have no more than 2 support people in the waiting area - these visitors may rotate.    Children under the age of 51 must have an adult with them who is not the patient.  Due to an increase in RSV and influenza rates and associated hospitalizations, children ages 41 and under may not visit patients in [facility] hospitals.  If the patient needs to stay at the hospital during part of their recovery, the visitor guidelines for inpatient rooms apply. Pre-op nurse will coordinate an appropriate time for 1 support person to accompany patient in pre-op.  This support person may not rotate.    Please refer to the [facility] website for the visitor guidelines for Inpatients (after your surgery is over and you are in a regular room).       Your procedure is scheduled on: 08-21-23   Report to [facility] Main Entrance    Report to admitting at 10:45 AM   Call this number if you have problems the morning of surgery [phone]   Do not eat food :After Midnight.   After Midnight you may have the following liquids until 10:!5 AM DAY OF SURGERY  Water  Non-Citrus Juices (without pulp, NO RED-Apple, White grape, White cranberry) Black Coffee (NO MILK/CREAM OR CREAMERS, sugar ok)  Clear Tea (NO MILK/CREAM OR CREAMERS, sugar ok) regular and decaf                             Plain Jell-O (NO RED)                                           Fruit ices (not with fruit pulp, NO RED)                                     Popsicles (NO RED)                                                               Sports drinks like Gatorade (NO RED)                   The day of surgery:  Drink ONE (1) Pre-Surgery Clear Ensure by 10:15 AM the morning of surgery. Drink in one sitting. Do not sip.  This drink was given to you during your hospital  pre-op appointment visit. Nothing else to drink after completing the Pre-Surgery Clear  Ensure          If you have questions, please contact your surgeon's office.   FOLLOW  ANY ADDITIONAL PRE OP INSTRUCTIONS YOU RECEIVED FROM YOUR SURGEON'S OFFICE!!!     Oral Hygiene is also important to reduce your risk of infection.                                    Remember - BRUSH YOUR TEETH THE MORNING OF SURGERY WITH YOUR REGULAR TOOTHPASTE  Do NOT smoke after Midnight   Take these medicines the morning of surgery with A SIP OF WATER :   Doxycycline   Omeprazole  Tylenol  if needed   Stop all vitamins and herbal supplements 7 days before surgery                              You may not have any metal on your body including hair pins, jewelry, and body piercing             Do not wear make-up, lotions, powders, perfumes or deodorant  Do not wear nail polish including gel and S&S, artificial/acrylic nails, or any other type of covering on natural nails including finger and toenails. If you have artificial nails, gel coating, etc. that needs to be removed by a nail salon please have this removed prior to surgery or surgery may need to be canceled/ delayed if the surgeon/ anesthesia feels like they are unable to be safely monitored.   Do not shave  48 hours prior to surgery.       Do not bring valuables to the hospital.  IS NOT RESPONSIBLE   FOR VALUABLES.   Contacts, dentures or bridgework may not be worn into surgery.   Bring small overnight bag day of surgery.   DO NOT BRING YOUR HOME MEDICATIONS TO THE HOSPITAL. PHARMACY WILL DISPENSE MEDICATIONS LISTED ON YOUR MEDICATION LIST TO YOU DURING YOUR ADMISSION IN THE HOSPITAL!               Please read over the following fact sheets you were given: IF YOU HAVE QUESTIONS ABOUT YOUR PRE-OP INSTRUCTIONS PLEASE CALL [phone] Susan Woodward  If you received a COVID test during your pre-op visit  it is requested that you wear a mask when out in public, stay away from anyone that may not be feeling well and notify your surgeon if  you develop symptoms. If you test positive for Covid or have been in contact with anyone that has tested positive in the last 10 days please notify you surgeon.    Pre-operative 5 CHG Bath Instructions   You can play a key role in reducing the risk of infection after surgery. Your skin needs to be as free of germs as possible. You can reduce the number of germs on your skin by washing with CHG (chlorhexidine  gluconate) soap before surgery. CHG is an antiseptic soap that kills germs and continues to kill germs even after washing.   DO NOT use if you have an allergy to chlorhexidine /CHG or antibacterial soaps. If your skin becomes reddened or irritated, stop using the CHG and notify one of our RNs at [phone].   Please shower with the CHG soap starting 4 days before surgery using the following schedule:     Please keep in mind the following:  DO NOT shave, including legs and underarms, starting the day of your first shower.   You may shave your face at any point before/day of surgery.  Place clean sheets on your bed the day you start using CHG soap. Use a clean washcloth (not used since being washed) for each shower. DO NOT sleep with pets once you start using the CHG.   CHG Shower Instructions:  If you choose to wash your hair and private area, wash first with your normal shampoo/soap.  After you use shampoo/soap, rinse your hair and body thoroughly to remove shampoo/soap residue.  Turn the water   OFF and apply about 3 tablespoons (45 ml) of CHG soap to a CLEAN washcloth.  Apply CHG soap ONLY FROM YOUR NECK DOWN TO YOUR TOES (washing for 3-5 minutes)  DO NOT use CHG soap on face, private areas, open wounds, or sores.  Pay special attention to the area where your surgery is being performed.  If you are having back surgery, having someone wash your back for you may be helpful. Wait 2 minutes after CHG soap is applied, then you may rinse off the CHG soap.  Pat dry with a clean towel   Put on clean clothes/pajamas   If you choose to wear lotion, please use ONLY the CHG-compatible lotions on the back of this paper.     Additional instructions for the day of surgery: DO NOT APPLY any lotions, deodorants, cologne, or perfumes.   Put on clean/comfortable clothes.  Brush your teeth.  Ask your nurse before applying any prescription medications to the skin.      CHG Compatible Lotions   Aveeno Moisturizing lotion  Cetaphil Moisturizing Cream  Cetaphil Moisturizing Lotion  Clairol Herbal Essence Moisturizing Lotion, Dry Skin  Clairol Herbal Essence Moisturizing Lotion, Extra Dry Skin  Clairol Herbal Essence Moisturizing Lotion, Normal Skin  Curel Age Defying Therapeutic Moisturizing Lotion with Alpha Hydroxy  Curel Extreme Care Body Lotion  Curel Soothing Hands Moisturizing Hand Lotion  Curel Therapeutic Moisturizing Cream, Fragrance-Free  Curel Therapeutic Moisturizing Lotion, Fragrance-Free  Curel Therapeutic Moisturizing Lotion, Original Formula  Eucerin Daily Replenishing Lotion  Eucerin Dry Skin Therapy Plus Alpha Hydroxy Crme  Eucerin Dry Skin Therapy Plus Alpha Hydroxy Lotion  Eucerin Original Crme  Eucerin Original Lotion  Eucerin Plus Crme Eucerin Plus Lotion  Eucerin TriLipid Replenishing Lotion  Keri Anti-Bacterial Hand Lotion  Keri Deep Conditioning Original Lotion Dry Skin Formula Softly Scented  Keri Deep Conditioning Original Lotion, Fragrance Free Sensitive Skin Formula  Keri Lotion Fast Absorbing Fragrance Free Sensitive Skin Formula  Keri Lotion Fast Absorbing Softly Scented Dry Skin Formula  Keri Original Lotion  Keri Skin Renewal Lotion Keri Silky Smooth Lotion  Keri Silky Smooth Sensitive Skin Lotion  Nivea Body Creamy Conditioning Oil  Nivea Body Extra Enriched Lotion  Nivea Body Original Lotion  Nivea Body Sheer Moisturizing Lotion Nivea Crme  Nivea Skin Firming Lotion  NutraDerm 30 Skin Lotion  NutraDerm Skin Lotion  NutraDerm  Therapeutic Skin Cream  NutraDerm Therapeutic Skin Lotion  ProShield Protective Hand Cream  Provon moisturizing lotion   PATIENT SIGNATURE_________________________________  NURSE SIGNATURE__________________________________  ________________________________________________________________________    Susan Woodward  An incentive spirometer is a tool that can help keep your lungs clear and active. This tool measures how well you are filling your lungs with each breath. Taking long deep breaths may help reverse or decrease the chance of developing breathing (pulmonary) problems (especially infection) following: A long period of time when you are unable to move or be active. BEFORE THE PROCEDURE  If the spirometer includes an indicator to show your best effort, your nurse or respiratory therapist will set it to a desired goal. If possible, sit up straight or lean slightly forward. Try not to slouch. Hold the incentive spirometer in an upright position. INSTRUCTIONS FOR USE  Sit on the edge of your bed if possible, or sit up as far as you can in bed or on a chair. Hold the incentive spirometer in an upright position. Breathe out normally. Place the mouthpiece in your mouth and seal your lips tightly around it. Breathe in  slowly and as deeply as possible, raising the piston or the ball toward the top of the column. Hold your breath for 3-5 seconds or for as long as possible. Allow the piston or ball to fall to the bottom of the column. Remove the mouthpiece from your mouth and breathe out normally. Rest for a few seconds and repeat Steps 1 through 7 at least 10 times every 1-2 hours when you are awake. Take your time and take a few normal breaths between deep breaths. The spirometer may include an indicator to show your best effort. Use the indicator as a goal to work toward during each repetition. After each set of 10 deep breaths, practice coughing to be sure your lungs are clear. If  you have an incision (the cut made at the time of surgery), support your incision when coughing by placing a pillow or rolled up towels firmly against it. Once you are able to get out of bed, walk around indoors and cough well. You may stop using the incentive spirometer when instructed by your caregiver.  RISKS AND COMPLICATIONS Take your time so you do not get dizzy or light-headed. If you are in pain, you may need to take or ask for pain medication before doing incentive spirometry. It is harder to take a deep breath if you are having pain. AFTER USE Rest and breathe slowly and easily. It can be helpful to keep track of a log of your progress. Your caregiver can provide you with a simple table to help with this. If you are using the spirometer at home, follow these instructions: SEEK MEDICAL CARE IF:  You are having difficultly using the spirometer. You have trouble using the spirometer as often as instructed. Your pain medication is not giving enough relief while using the spirometer. You develop fever of 100.5 F (38.1 C) or higher. SEEK IMMEDIATE MEDICAL CARE IF:  You cough up bloody sputum that had not been present before. You develop fever of 102 F (38.9 C) or greater. You develop worsening pain at or near the incision site. MAKE SURE YOU:  Understand these instructions. Will watch your condition. Will get help right away if you are not doing well or get worse. Document Released: 11/24/2006 Document Revised: 10/06/2011 Document Reviewed: 01/25/2007 [facility] Patient Information 2014 [place], [place].   ________________________________________________________________________

## 2023-08-11 DIAGNOSIS — L720 Epidermal cyst: Secondary | ICD-10-CM | POA: Diagnosis not present

## 2023-08-11 DIAGNOSIS — L570 Actinic keratosis: Secondary | ICD-10-CM | POA: Diagnosis not present

## 2023-08-11 NOTE — Progress Notes (Addendum)
 COVID Vaccine Completed:  Date of COVID positive in last 90 days:  No  PCP - Sharry Deem, FNP Cardiologist - Alois Arnt, MD (last OV 2022)  Chest x-ray - N/A EKG - 12-29-22 Epic Stress Test -  N/A ECHO -  N/A Cardiac Cath -  N/A Pacemaker/ICD device last checked: Spinal Cord Stimulator:  N/A  Bowel Prep - N/A  Sleep Study - N/A CPAP -   Fasting Blood Sugar - N/A Checks Blood Sugar _____ times a day  Last dose of GLP1 agonist-  N/A GLP1 instructions:  Hold 7 days before surgery    Last dose of SGLT-2 inhibitors-  N/A SGLT-2 instructions:  Hold 3 days before surgery    Blood Thinner Instructions:  N/A Aspirin  Instructions: Last Dose:  Activity level:  Can go up a flight of stairs and perform activities of daily living without stopping and without symptoms of chest pain or shortness of breath.  Anesthesia review: Poor R wave progression on EKG, HTN  Patient denies shortness of breath, fever, cough and chest pain at PAT appointment  Patient verbalized understanding of instructions that were given to them at the PAT appointment. Patient was also instructed that they will need to review over the PAT instructions again at home before surgery.

## 2023-08-12 ENCOUNTER — Encounter (HOSPITAL_COMMUNITY): Payer: Self-pay

## 2023-08-12 ENCOUNTER — Other Ambulatory Visit: Payer: Self-pay

## 2023-08-12 ENCOUNTER — Encounter (HOSPITAL_COMMUNITY)
Admission: RE | Admit: 2023-08-12 | Discharge: 2023-08-12 | Disposition: A | Discharge status: Home / Self Care | Payer: Medicare Other | Source: Ambulatory Visit | Attending: Orthopaedic Surgery | Admitting: Orthopaedic Surgery

## 2023-08-12 VITALS — BP 141/85 | HR 81 | Temp 98.3°F | Resp 16 | Ht <= 58 in | Wt 120.2 lb

## 2023-08-12 DIAGNOSIS — Z01812 Encounter for preprocedural laboratory examination: Secondary | ICD-10-CM | POA: Diagnosis not present

## 2023-08-12 DIAGNOSIS — Z01818 Encounter for other preprocedural examination: Secondary | ICD-10-CM

## 2023-08-12 DIAGNOSIS — M1711 Unilateral primary osteoarthritis, right knee: Secondary | ICD-10-CM | POA: Insufficient documentation

## 2023-08-12 HISTORY — DX: Unspecified osteoarthritis, unspecified site: M19.90

## 2023-08-12 LAB — BASIC METABOLIC PANEL
Anion gap: 11 (ref 5–15)
BUN: 16 mg/dL (ref 8–23)
CO2: 22 mmol/L (ref 22–32)
Calcium: 8.9 mg/dL (ref 8.9–10.3)
Chloride: 101 mmol/L (ref 98–111)
Creatinine, Ser: 0.71 mg/dL (ref 0.44–1.00)
GFR, Estimated: >60 mL/min (ref >60)
Glucose, Bld: 92 mg/dL (ref 70–99)
Potassium: 4.2 mmol/L (ref 3.5–5.1)
Sodium: 134 mmol/L — ABNORMAL LOW (ref 135–145)

## 2023-08-12 LAB — SURGICAL PCR SCREEN
MRSA, PCR: NEGATIVE
Staphylococcus aureus: NEGATIVE

## 2023-08-12 LAB — CBC
HCT: 40.7 % (ref 36.0–46.0)
Hemoglobin: 12.9 g/dL (ref 12.0–15.0)
MCH: 29.5 pg (ref 26.0–34.0)
MCHC: 31.7 g/dL (ref 30.0–36.0)
MCV: 93.1 fL (ref 80.0–100.0)
Platelets: 365 10*3/uL (ref 150–400)
RBC: 4.37 MIL/uL (ref 3.87–5.11)
RDW: 12.7 % (ref 11.5–15.5)
WBC: 7.2 10*3/uL (ref 4.0–10.5)
nRBC: 0 % (ref 0.0–0.2)

## 2023-08-18 ENCOUNTER — Telehealth: Payer: Self-pay | Admitting: Orthopaedic Surgery

## 2023-08-18 NOTE — Telephone Encounter (Signed)
Patient called asked if she will be set up for PT in her home after she gets released from the hospital? Patient said surgery is Friday 08/21/2023.   Patient said she was pretty sick after her other two surgeries. Patient asked what pain medication will she be taking? The number to contact patient is [phone]

## 2023-08-18 NOTE — Telephone Encounter (Signed)
Patients questions answered

## 2023-08-20 NOTE — H&P (Signed)
TOTAL KNEE ADMISSION H&P  Patient is being admitted for right total knee arthroplasty.  Subjective:  Chief Complaint:right knee pain.  HPI: Susan Woodward, 68 y.o. female, has a history of pain and functional disability in the right knee due to arthritis and has failed non-surgical conservative treatments for greater than 12 weeks to includeNSAID's and/or analgesics, corticosteriod injections, use of assistive devices, and activity modification.  Onset of symptoms was gradual, starting 5 years ago with gradually worsening course since that time. The patient noted no past surgery on the right knee(s).  Patient currently rates pain in the right knee(s) at 10 out of 10 with activity. Patient has night pain, worsening of pain with activity and weight bearing, pain that interferes with activities of daily living, pain with passive range of motion, crepitus, and joint swelling.  Patient has evidence of subchondral sclerosis, periarticular osteophytes, and joint space narrowing by imaging studies. There is no active infection.  Patient Active Problem List   Diagnosis Date Noted   Unilateral primary osteoarthritis, right knee 07/08/2023   Status post total replacement of left hip 01/02/2023   Degenerative spondylolisthesis 08/26/2021   Spinal stenosis of lumbar region 07/13/2017   Trochanteric bursitis, right hip 10/01/2016   Past Medical History:  Diagnosis Date   Arthritis    GERD (gastroesophageal reflux disease)    Hypertension    Osteoporosis    PONV (postoperative nausea and vomiting)     Past Surgical History:  Procedure Laterality Date   BACK SURGERY  08/16/2020   L4-5 fusion by Dr. Lelon Perla   TOTAL HIP ARTHROPLASTY Left 01/02/2023   Procedure: LEFT TOTAL HIP ARTHROPLASTY ANTERIOR APPROACH;  Surgeon: Kathryne Hitch, MD;  Location: [facility];  Service: Orthopedics;  Laterality: Left;   WRIST SURGERY Right    12 years ago    No current facility-administered medications for this  encounter.   Current Outpatient Medications  Medication Sig Dispense Refill Last Dose/Taking   acetaminophen (TYLENOL) 650 MG CR tablet Take 1,300 mg by mouth in the morning, at noon, and at bedtime.   Taking   b complex vitamins capsule Take 1 capsule by mouth daily.   Taking   Calcium Carbonate-Vit D-Min (CALCIUM 600+D3 PLUS MINERALS PO) Take 1 tablet by mouth daily.   Taking   Cholecalciferol (VITAMIN D3) 10 MCG (400 UNIT) CAPS Take 400 Units by mouth daily.   Taking   denosumab (PROLIA) 60 MG/ML SOSY injection Inject 60 mg into the skin every 6 (six) months.   Taking   mupirocin ointment (BACTROBAN) 2 % Apply 1 Application topically daily. Apply to wound once daily (Patient not taking: Reported on 08/07/2023) 22 g 0 Not Taking   omeprazole (PRILOSEC) 40 MG capsule Take 40 mg by mouth daily.   Taking   TURMERIC PO Take 1,000 mg by mouth in the morning.   Taking   valsartan (DIOVAN) 80 MG tablet Take 80 mg by mouth in the morning.   Taking   aspirin 81 MG chewable tablet Chew 1 tablet (81 mg total) by mouth 2 (two) times daily. (Patient not taking: Reported on 08/07/2023) 30 tablet 0 Not Taking   doxycycline (VIBRA-TABS) 100 MG tablet Take 1 tablet (100 mg total) by mouth 2 (two) times daily. 30 tablet 0    methocarbamol (ROBAXIN) 500 MG tablet Take 1 tablet (500 mg total) by mouth every 6 (six) hours as needed for muscle spasms. (Patient not taking: Reported on 08/07/2023) 30 tablet 0 Not Taking  No Known Allergies  Social History   Tobacco Use   Smoking status: Former    Types: Cigarettes   Smokeless tobacco: Never  Substance Use Topics   Alcohol use: Not Currently    No family history on file.   Review of Systems  Objective:  Physical Exam Vitals reviewed.  Constitutional:      Appearance: Normal appearance. She is normal weight.  HENT:     Head: Normocephalic and atraumatic.  Eyes:     Extraocular Movements: Extraocular movements intact.     Pupils: Pupils are equal,  round, and reactive to light.  Cardiovascular:     Rate and Rhythm: Normal rate and regular rhythm.     Pulses: Normal pulses.  Pulmonary:     Effort: Pulmonary effort is normal.     Breath sounds: Normal breath sounds.  Abdominal:     Palpations: Abdomen is soft.  Musculoskeletal:     Cervical back: Normal range of motion and neck supple.     Right knee: Effusion, bony tenderness and crepitus present. Decreased range of motion. Tenderness present over the medial joint line and lateral joint line. Abnormal alignment.  Neurological:     Mental Status: She is alert and oriented to person, place, and time.  Psychiatric:        Behavior: Behavior normal.     Vital signs in last 24 hours:    Labs:   Estimated body mass index is 25.12 kg/m as calculated from the following:   Height as of 08/12/23: 4\' 10"  (1.473 m).   Weight as of 08/12/23: 54.5 kg.   Imaging Review Plain radiographs and a MRI demonstrate severe degenerative joint disease of the right knee(s). The overall alignment ismild valgus. The bone quality appears to be good for age and reported activity level.      Assessment/Plan:  End stage arthritis, right knee   The patient history, physical examination, clinical judgment of the provider and imaging studies are consistent with end stage degenerative joint disease of the right knee(s) and total knee arthroplasty is deemed medically necessary. The treatment options including medical management, injection therapy arthroscopy and arthroplasty were discussed at length. The risks and benefits of total knee arthroplasty were presented and reviewed. The risks due to aseptic loosening, infection, stiffness, patella tracking problems, thromboembolic complications and other imponderables were discussed. The patient acknowledged the explanation, agreed to proceed with the plan and consent was signed. Patient is being admitted for inpatient treatment for surgery, pain control, PT, OT,  prophylactic antibiotics, VTE prophylaxis, progressive ambulation and ADL's and discharge planning. The patient is planning to be discharged home with home health services

## 2023-08-21 ENCOUNTER — Other Ambulatory Visit: Payer: Self-pay

## 2023-08-21 ENCOUNTER — Observation Stay (HOSPITAL_COMMUNITY)
Admission: RE | Admit: 2023-08-21 | Discharge: 2023-08-22 | Disposition: A | Discharge status: Home Health | Payer: Medicare Other | Attending: Orthopaedic Surgery | Admitting: Orthopaedic Surgery

## 2023-08-21 ENCOUNTER — Ambulatory Visit (HOSPITAL_COMMUNITY): Payer: Medicare Other | Admitting: Physician Assistant

## 2023-08-21 ENCOUNTER — Observation Stay (HOSPITAL_COMMUNITY): Payer: Medicare Other

## 2023-08-21 ENCOUNTER — Encounter (HOSPITAL_COMMUNITY)
Admission: RE | Disposition: A | Discharge status: Home Health | Payer: Self-pay | Source: Home / Self Care | Attending: Orthopaedic Surgery

## 2023-08-21 ENCOUNTER — Ambulatory Visit (HOSPITAL_COMMUNITY): Payer: Medicare Other | Admitting: Anesthesiology

## 2023-08-21 ENCOUNTER — Encounter (HOSPITAL_COMMUNITY): Payer: Self-pay | Admitting: Orthopaedic Surgery

## 2023-08-21 DIAGNOSIS — Z7982 Long term (current) use of aspirin: Secondary | ICD-10-CM | POA: Diagnosis not present

## 2023-08-21 DIAGNOSIS — M1711 Unilateral primary osteoarthritis, right knee: Secondary | ICD-10-CM | POA: Diagnosis not present

## 2023-08-21 DIAGNOSIS — G8918 Other acute postprocedural pain: Secondary | ICD-10-CM | POA: Diagnosis not present

## 2023-08-21 DIAGNOSIS — Z79899 Other long term (current) drug therapy: Secondary | ICD-10-CM | POA: Insufficient documentation

## 2023-08-21 DIAGNOSIS — R609 Edema, unspecified: Secondary | ICD-10-CM | POA: Diagnosis not present

## 2023-08-21 DIAGNOSIS — I10 Essential (primary) hypertension: Secondary | ICD-10-CM | POA: Insufficient documentation

## 2023-08-21 DIAGNOSIS — Z96651 Presence of right artificial knee joint: Secondary | ICD-10-CM

## 2023-08-21 DIAGNOSIS — Z87891 Personal history of nicotine dependence: Secondary | ICD-10-CM | POA: Insufficient documentation

## 2023-08-21 DIAGNOSIS — Z96642 Presence of left artificial hip joint: Secondary | ICD-10-CM | POA: Insufficient documentation

## 2023-08-21 HISTORY — PX: TOTAL KNEE ARTHROPLASTY: SHX125

## 2023-08-21 SURGERY — ARTHROPLASTY, KNEE, TOTAL
Anesthesia: Regional | Site: Knee | Laterality: Right

## 2023-08-21 MED ORDER — SODIUM CHLORIDE 0.9 % IV SOLN
INTRAVENOUS | Status: DC
Start: 2023-08-21 — End: 2023-08-22

## 2023-08-21 MED ORDER — CEFAZOLIN SODIUM-DEXTROSE 2-4 GM/100ML-% IV SOLN
2.0000 g | Freq: Four times a day (QID) | INTRAVENOUS | Status: AC
  Administered 2023-08-21 – 2023-08-22 (×2): 2 g via INTRAVENOUS
  Filled 2023-08-21 (×2): qty 100

## 2023-08-21 MED ORDER — IRBESARTAN 75 MG PO TABS
75.0000 mg | ORAL_TABLET | Freq: Every day | ORAL | Status: DC
  Administered 2023-08-22: 75 mg via ORAL
  Filled 2023-08-21: qty 1

## 2023-08-21 MED ORDER — KETOROLAC TROMETHAMINE 15 MG/ML IJ SOLN
7.5000 mg | Freq: Four times a day (QID) | INTRAMUSCULAR | Status: AC
  Administered 2023-08-21 – 2023-08-22 (×4): 7.5 mg via INTRAVENOUS
  Filled 2023-08-21 (×4): qty 1

## 2023-08-21 MED ORDER — BUPIVACAINE IN DEXTROSE 0.75-8.25 % IT SOLN
INTRATHECAL | Status: DC | PRN
  Administered 2023-08-21: 1.6 mL via INTRATHECAL

## 2023-08-21 MED ORDER — PHENYLEPHRINE HCL (PRESSORS) 10 MG/ML IV SOLN
INTRAVENOUS | Status: DC | PRN
  Administered 2023-08-21 (×2): 120 ug via INTRAVENOUS

## 2023-08-21 MED ORDER — ORAL CARE MOUTH RINSE
15.0000 mL | Freq: Once | OROMUCOSAL | Status: AC
Start: 2023-08-21 — End: 2023-08-21

## 2023-08-21 MED ORDER — METHOCARBAMOL 1000 MG/10ML IJ SOLN
500.0000 mg | Freq: Four times a day (QID) | INTRAMUSCULAR | Status: DC | PRN
  Administered 2023-08-21: 500 mg via INTRAVENOUS

## 2023-08-21 MED ORDER — OXYCODONE HCL 5 MG PO TABS
5.0000 mg | ORAL_TABLET | ORAL | Status: DC | PRN
Start: 2023-08-21 — End: 2023-08-22
  Administered 2023-08-22: 10 mg via ORAL
  Filled 2023-08-21 (×2): qty 2

## 2023-08-21 MED ORDER — METOCLOPRAMIDE HCL 5 MG/ML IJ SOLN
5.0000 mg | Freq: Three times a day (TID) | INTRAMUSCULAR | Status: DC | PRN
  Administered 2023-08-21 – 2023-08-22 (×2): 5 mg via INTRAVENOUS
  Filled 2023-08-21 (×2): qty 2

## 2023-08-21 MED ORDER — MIDAZOLAM HCL 2 MG/2ML IJ SOLN
2.0000 mg | Freq: Once | INTRAMUSCULAR | Status: AC
  Administered 2023-08-21: 1 mg via INTRAVENOUS
  Filled 2023-08-21: qty 2

## 2023-08-21 MED ORDER — METHOCARBAMOL 1000 MG/10ML IJ SOLN
INTRAMUSCULAR | Status: AC
  Filled 2023-08-21: qty 10

## 2023-08-21 MED ORDER — PANTOPRAZOLE SODIUM 40 MG PO TBEC
40.0000 mg | DELAYED_RELEASE_TABLET | Freq: Every day | ORAL | Status: DC
  Administered 2023-08-22: 40 mg via ORAL
  Filled 2023-08-21: qty 1

## 2023-08-21 MED ORDER — BUPIVACAINE-EPINEPHRINE 0.25% -1:200000 IJ SOLN
INTRAMUSCULAR | Status: AC
  Filled 2023-08-21: qty 1

## 2023-08-21 MED ORDER — HYDROMORPHONE HCL 1 MG/ML IJ SOLN
INTRAMUSCULAR | Status: DC | PRN
  Administered 2023-08-21: .6 mg via INTRAVENOUS
  Administered 2023-08-21: .2 mg via INTRAVENOUS

## 2023-08-21 MED ORDER — DOCUSATE SODIUM 100 MG PO CAPS
100.0000 mg | ORAL_CAPSULE | Freq: Two times a day (BID) | ORAL | Status: DC
  Administered 2023-08-21: 100 mg via ORAL
  Filled 2023-08-21 (×2): qty 1

## 2023-08-21 MED ORDER — HYDROMORPHONE HCL 1 MG/ML IJ SOLN
0.5000 mg | INTRAMUSCULAR | Status: DC | PRN
  Administered 2023-08-21: 0.5 mg via INTRAVENOUS
  Administered 2023-08-22: 1 mg via INTRAVENOUS
  Filled 2023-08-21 (×2): qty 1

## 2023-08-21 MED ORDER — BUPIVACAINE-EPINEPHRINE 0.25% -1:200000 IJ SOLN
INTRAMUSCULAR | Status: DC | PRN
  Administered 2023-08-21: 30 mL

## 2023-08-21 MED ORDER — MENTHOL 3 MG MT LOZG: 1.0000 | LOZENGE | OROMUCOSAL | Status: DC | PRN

## 2023-08-21 MED ORDER — ALUM & MAG HYDROXIDE-SIMETH 200-200-20 MG/5ML PO SUSP: 30.0000 mL | ORAL | Status: DC | PRN

## 2023-08-21 MED ORDER — HYDROMORPHONE HCL 1 MG/ML IJ SOLN
0.2500 mg | INTRAMUSCULAR | Status: DC | PRN
  Administered 2023-08-21 (×2): 0.25 mg via INTRAVENOUS

## 2023-08-21 MED ORDER — BUPIVACAINE-EPINEPHRINE (PF) 0.5% -1:200000 IJ SOLN
INTRAMUSCULAR | Status: DC | PRN
  Administered 2023-08-21: 20 mL via PERINEURAL

## 2023-08-21 MED ORDER — GABAPENTIN 100 MG PO CAPS
100.0000 mg | ORAL_CAPSULE | Freq: Three times a day (TID) | ORAL | Status: DC
  Administered 2023-08-21 – 2023-08-22 (×2): 100 mg via ORAL
  Filled 2023-08-21 (×2): qty 1

## 2023-08-21 MED ORDER — HYDROMORPHONE HCL 2 MG/ML IJ SOLN
INTRAMUSCULAR | Status: AC
  Filled 2023-08-21: qty 1

## 2023-08-21 MED ORDER — CHLORHEXIDINE GLUCONATE 0.12 % MT SOLN
15.0000 mL | Freq: Once | OROMUCOSAL | Status: AC
  Administered 2023-08-21: 15 mL via OROMUCOSAL

## 2023-08-21 MED ORDER — POVIDONE-IODINE 10 % EX SWAB: 2.0000 | Freq: Once | CUTANEOUS | Status: DC

## 2023-08-21 MED ORDER — METOCLOPRAMIDE HCL 5 MG PO TABS: 5.0000 mg | ORAL_TABLET | Freq: Three times a day (TID) | ORAL | Status: DC | PRN

## 2023-08-21 MED ORDER — LACTATED RINGERS IV SOLN: INTRAVENOUS | Status: DC

## 2023-08-21 MED ORDER — PROPOFOL 500 MG/50ML IV EMUL
INTRAVENOUS | Status: DC | PRN
  Administered 2023-08-21: 100 mg via INTRAVENOUS
  Administered 2023-08-21: 100 ug/kg/min via INTRAVENOUS

## 2023-08-21 MED ORDER — ACETAMINOPHEN 10 MG/ML IV SOLN
1000.0000 mg | Freq: Once | INTRAVENOUS | Status: AC
  Administered 2023-08-21: 1000 mg via INTRAVENOUS

## 2023-08-21 MED ORDER — PHENYLEPHRINE HCL-NACL 20-0.9 MG/250ML-% IV SOLN
INTRAVENOUS | Status: DC | PRN
  Administered 2023-08-21: 25 ug/min via INTRAVENOUS

## 2023-08-21 MED ORDER — ASPIRIN 81 MG PO CHEW
81.0000 mg | CHEWABLE_TABLET | Freq: Two times a day (BID) | ORAL | Status: DC
  Administered 2023-08-22: 81 mg via ORAL
  Filled 2023-08-21: qty 1

## 2023-08-21 MED ORDER — HYDROMORPHONE HCL 1 MG/ML IJ SOLN
INTRAMUSCULAR | Status: AC
  Filled 2023-08-21: qty 1

## 2023-08-21 MED ORDER — SODIUM CHLORIDE 0.9 % IR SOLN
Status: DC | PRN
  Administered 2023-08-21: 1000 mL

## 2023-08-21 MED ORDER — DIPHENHYDRAMINE HCL 12.5 MG/5ML PO ELIX: 12.5000 mg | ORAL_SOLUTION | ORAL | Status: DC | PRN

## 2023-08-21 MED ORDER — METHOCARBAMOL 500 MG PO TABS
500.0000 mg | ORAL_TABLET | Freq: Four times a day (QID) | ORAL | Status: DC | PRN
  Administered 2023-08-21 – 2023-08-22 (×2): 500 mg via ORAL
  Filled 2023-08-21 (×2): qty 1

## 2023-08-21 MED ORDER — PROMETHAZINE HCL 25 MG PO TABS
12.5000 mg | ORAL_TABLET | Freq: Four times a day (QID) | ORAL | Status: DC | PRN
  Administered 2023-08-21 – 2023-08-22 (×2): 12.5 mg via ORAL
  Filled 2023-08-21 (×2): qty 1

## 2023-08-21 MED ORDER — 0.9 % SODIUM CHLORIDE (POUR BTL) OPTIME
TOPICAL | Status: DC | PRN
  Administered 2023-08-21: 1000 mL

## 2023-08-21 MED ORDER — FENTANYL CITRATE PF 50 MCG/ML IJ SOSY
100.0000 ug | PREFILLED_SYRINGE | Freq: Once | INTRAMUSCULAR | Status: AC
  Administered 2023-08-21: 100 ug via INTRAVENOUS
  Filled 2023-08-21: qty 2

## 2023-08-21 MED ORDER — OXYCODONE HCL 5 MG PO TABS
10.0000 mg | ORAL_TABLET | ORAL | Status: DC | PRN
Start: 2023-08-21 — End: 2023-08-22

## 2023-08-21 MED ORDER — ACETAMINOPHEN 325 MG PO TABS
325.0000 mg | ORAL_TABLET | Freq: Four times a day (QID) | ORAL | Status: DC | PRN
Start: 2023-08-22 — End: 2023-08-22
  Administered 2023-08-22: 650 mg via ORAL
  Filled 2023-08-21: qty 2

## 2023-08-21 MED ORDER — EPHEDRINE SULFATE-NACL 50-0.9 MG/10ML-% IV SOSY
PREFILLED_SYRINGE | INTRAVENOUS | Status: DC | PRN
  Administered 2023-08-21: 7.5 mg via INTRAVENOUS

## 2023-08-21 MED ORDER — ONDANSETRON HCL 4 MG/2ML IJ SOLN
INTRAMUSCULAR | Status: DC | PRN
  Administered 2023-08-21: 4 mg via INTRAVENOUS

## 2023-08-21 MED ORDER — STERILE WATER FOR IRRIGATION IR SOLN
Status: DC | PRN
  Administered 2023-08-21: 1000 mL

## 2023-08-21 MED ORDER — ACETAMINOPHEN 500 MG PO TABS
1000.0000 mg | ORAL_TABLET | Freq: Once | ORAL | Status: DC
  Filled 2023-08-21: qty 2

## 2023-08-21 MED ORDER — PHENOL 1.4 % MT LIQD: 1.0000 | OROMUCOSAL | Status: DC | PRN

## 2023-08-21 MED ORDER — DEXAMETHASONE SODIUM PHOSPHATE 10 MG/ML IJ SOLN
INTRAMUSCULAR | Status: DC | PRN
  Administered 2023-08-21: 8 mg via INTRAVENOUS

## 2023-08-21 MED ORDER — CEFAZOLIN SODIUM-DEXTROSE 2-4 GM/100ML-% IV SOLN
2.0000 g | INTRAVENOUS | Status: AC
  Administered 2023-08-21: 2 g via INTRAVENOUS
  Filled 2023-08-21: qty 100

## 2023-08-21 MED ORDER — ACETAMINOPHEN 10 MG/ML IV SOLN
INTRAVENOUS | Status: AC
  Filled 2023-08-21: qty 100

## 2023-08-21 MED ORDER — TRANEXAMIC ACID-NACL 1000-0.7 MG/100ML-% IV SOLN
1000.0000 mg | INTRAVENOUS | Status: AC
  Administered 2023-08-21: 1000 mg via INTRAVENOUS
  Filled 2023-08-21: qty 100

## 2023-08-21 SURGICAL SUPPLY — 54 items
BAG COUNTER SPONGE SURGICOUNT (BAG) IMPLANT
BAG ZIPLOCK 12X15 (MISCELLANEOUS) ×1 IMPLANT
BENZOIN TINCTURE PRP APPL 2/3 (GAUZE/BANDAGES/DRESSINGS) IMPLANT
BLADE SAG 18X100X1.27 (BLADE) ×1 IMPLANT
BLADE SURG SZ10 CARB STEEL (BLADE) ×2 IMPLANT
BNDG ELASTIC 6INX 5YD STR LF (GAUZE/BANDAGES/DRESSINGS) ×2 IMPLANT
BNDG ELASTIC 6X10 VLCR STRL LF (GAUZE/BANDAGES/DRESSINGS) IMPLANT
BOWL SMART MIX CTS (DISPOSABLE) IMPLANT
CEMENT BONE R 1X40 (Cement) IMPLANT
COMP FEM PS KNEE NRW 8 RT (Joint) ×1 IMPLANT
COMP PATELLA 3 PEG 29X9 (Joint) ×1 IMPLANT
COMPONENT FEM PS KNEE NRW 8 RT (Joint) IMPLANT
COMPONENT PATELLA 3 PEG 29X9 (Joint) IMPLANT
COOLER ICEMAN CLASSIC (MISCELLANEOUS) ×1 IMPLANT
COVER SURGICAL LIGHT HANDLE (MISCELLANEOUS) ×1 IMPLANT
CUFF TRNQT CYL 34X4.125X (TOURNIQUET CUFF) ×1 IMPLANT
DRAPE INCISE IOBAN 66X45 STRL (DRAPES) ×1 IMPLANT
DRAPE U-SHAPE 47X51 STRL (DRAPES) ×1 IMPLANT
DRSG XEROFORM 1X8 (GAUZE/BANDAGES/DRESSINGS) IMPLANT
DURAPREP 26ML APPLICATOR (WOUND CARE) ×1 IMPLANT
ELECT BLADE TIP CTD 4 INCH (ELECTRODE) ×1 IMPLANT
ELECT REM PT RETURN 15FT ADLT (MISCELLANEOUS) ×1 IMPLANT
GAUZE PAD ABD 8X10 STRL (GAUZE/BANDAGES/DRESSINGS) ×2 IMPLANT
GAUZE SPONGE 4X4 12PLY STRL (GAUZE/BANDAGES/DRESSINGS) ×1 IMPLANT
GAUZE XEROFORM 1X8 LF (GAUZE/BANDAGES/DRESSINGS) IMPLANT
GLOVE BIO SURGEON STRL SZ7.5 (GLOVE) ×1 IMPLANT
GLOVE BIOGEL PI IND STRL 8 (GLOVE) ×2 IMPLANT
GLOVE ECLIPSE 8.0 STRL XLNG CF (GLOVE) ×1 IMPLANT
GOWN STRL REUS W/ TWL XL LVL3 (GOWN DISPOSABLE) ×2 IMPLANT
HOLDER FOLEY CATH W/STRAP (MISCELLANEOUS) IMPLANT
IMMOBILIZER KNEE 20 (SOFTGOODS) ×1
IMMOBILIZER KNEE 20 THIGH 36 (SOFTGOODS) ×1 IMPLANT
INSERT TIB PERS SZ 8-9 10 RT (Insert) IMPLANT
KIT TURNOVER KIT A (KITS) IMPLANT
NS IRRIG 1000ML POUR BTL (IV SOLUTION) ×1 IMPLANT
PACK TOTAL KNEE CUSTOM (KITS) ×1 IMPLANT
PAD COLD SHLDR WRAP-ON (PAD) ×1 IMPLANT
PADDING CAST COTTON 6X4 STRL (CAST SUPPLIES) ×2 IMPLANT
PIN DRILL HDLS TROCAR 75 4PK (PIN) IMPLANT
PROTECTOR NERVE ULNAR (MISCELLANEOUS) ×1 IMPLANT
SCREW FEMALE HEX FIX 25X2.5 (ORTHOPEDIC DISPOSABLE SUPPLIES) IMPLANT
SET HNDPC FAN SPRY TIP SCT (DISPOSABLE) ×1 IMPLANT
SET PAD KNEE POSITIONER (MISCELLANEOUS) ×1 IMPLANT
SPIKE FLUID TRANSFER (MISCELLANEOUS) IMPLANT
STAPLER SKIN PROX WIDE 3.9 (STAPLE) IMPLANT
STEM TIB PS KNEE D 0D RT (Stem) IMPLANT
STRIP CLOSURE SKIN 1/2X4 (GAUZE/BANDAGES/DRESSINGS) IMPLANT
SUT MNCRL AB 4-0 PS2 18 (SUTURE) IMPLANT
SUT VIC AB 0 CT1 27XBRD ANTBC (SUTURE) ×1 IMPLANT
SUT VIC AB 0 CT1 36 (SUTURE) IMPLANT
SUT VIC AB 1 CT1 36 (SUTURE) ×2 IMPLANT
SUT VIC AB 2-0 CT1 TAPERPNT 27 (SUTURE) ×2 IMPLANT
TRAY FOLEY MTR SLVR 16FR STAT (SET/KITS/TRAYS/PACK) IMPLANT
WATER STERILE IRR 1000ML POUR (IV SOLUTION) ×2 IMPLANT

## 2023-08-21 NOTE — Op Note (Signed)
Operative  Note  Date of operation: 08/21/2023 Preoperative diagnosis: Right knee primary osteoarthritis Postoperative diagnosis: Same  Procedure: Right press-fit total knee arthroplasty  Implants: Biomet/Zimmer persona press-fit knee system Implant Name Type Inv. Item Serial No. Manufacturer Lot No. LRB No. Used Action  STEM TIB PS KNEE D 0D RT - [id number] Stem STEM TIB PS KNEE D 0D RT  ZIMMER RECON(ORTH,TRAU,BIO,SG) [id number] Right 1 Implanted  COMP FEM PS KNEE NRW 8 RT - [id number] Joint COMP FEM PS KNEE NRW 8 RT  ZIMMER RECON(ORTH,TRAU,BIO,SG) [id number] Right 1 Implanted  INSERT TIB PERS SZ 8-9 10 RT - [id number] Insert INSERT TIB PERS SZ 8-9 10 RT  ZIMMER RECON(ORTH,TRAU,BIO,SG) [id number] Right 1 Implanted  COMP PATELLA 3 PEG 29X9 - [id number] Joint COMP PATELLA 3 PEG 29X9  ZIMMER RECON(ORTH,TRAU,BIO,SG) [id number] Right 1 Implanted   Surgeon: Vanita Panda. Magnus Ivan, MD System: Rexene Edison, PA-C  Anesthesia: #1 right lower extremity block, #2 attempted spinal, #3 General, #4 local Tourniquet time: Under 1 hour EBL: Less than 50 cc Antibiotics: IV Ancef Complications: None  Indications: The patient is a 68 year old female well-known to me.  She has been dealing with debilitating arthritis of her right knee for some period of time.  It is gotten worse for her and her right knee pain is daily.  It is detrimentally affecting her mobility, her quality of life and her activities day living to the point she does wish to proceed with a total knee arthroplasty.  She is also tried and failed conservative treatment for over a year now.  With surgery she understands the risks of acute blood loss anemia, nerve or vessel injury, fracture, infection, DVT, implant failure and wound healing issues.  She understands that our goals are hopefully decreased pain, improved mobility and improved quality of life.  Procedure description: After informed consent was obtained and the appropriate right knee was  marked, anesthesia obtained a right lower extremity adductor canal block in the holding room.  The patient was then brought to the operating room and set up lab and table where spinal anesthesia was obtained.  She was laid in a supine position on the operating table and a Foley catheter was placed.  A nonsterile to get placed around her upper right thigh and her right thigh, knee, leg and ankle were prepped and draped with DuraPrep and sterile drapes.  A timeout was called and she was identified as the correct patient the correct right knee.  An Esmarch was then used to wrap out the leg and the tourniquet was plated to 300 mm of pressure.  We then tested her with pinching the knee really hard before making incision and she could feel that so anesthesia went with general anesthesia.  We then proceeded with the case.  A direct midline incision was carried out of the knee and taken proximally and distally.  We dissect down the knee joint and a medial parapatellar arthrotomy was made finding a large joint effusion.  With the knee in a flexed position we removed remnants of the ACL as well as medial lateral meniscus and osteophytes in all 3 compartments.  There was significant cartilage wear both lateral and medial.  We then used an extramedullary cutting guide for making her proximal tibia cut we corrected for varus and valgus and a 7 degree slope.  Made this cut to take 2 mm of the high side and eventually we backed this down for more millimeters.  We used an intramedullary cutting guide for distal  femur cut setting this for a right knee at 5 degrees externally rotated for a 10 mm distal femoral cut.  We made that cut without difficulty and brought the knee back down to full extension and it achieved full extension with a 10 mm extension block.  We then moved back to the femur and put the femoral sizing guide based off the epicondylar axis.  Based off of this we chose a size 8 femur.  We put a 4-in-1 cutting block for  a size 8 femur and made our anterior and posterior cuts followed our chamfer cuts.  We then went back to the tibia and chose a size D tibial tray for coverage over the tibial plateau for a right knee setting the rotation of the tibial tubercle and the femur.  We did our drill hole and keel punch over this we found really good quality bone for proceeding with press-fit implants.  We trialed a size D right tibial tray combined with her size 8 right CR standard femur.  We placed a 10 mm right medial congruent polythene insert we are pleased with range of motion and stability that answered.  The only thing we wanted to changes go within narrow femur as opposed to the standard femur since she has small bone.  We then made a patella cut and thorough 3 holes for a size 29 press-fit patella button.  We then removed all trial instrumentation with the knee and irrigated the knee with normal saline solution.  We then placed Marcaine with epinephrine around the arthrotomy.  Next with the knee in a flexed position we placed our Biomet/Zimmer persona tibial tray for a right knee size D which is press-fit.  We then placed our size 8 right narrow press-fit CR femur.  We placed our 10 mm thickness right medial congruent polythene insert and press-fit our size 29 patella button.  Again we are pleased with range of motion and stability with the inserts in place.  We then let the tourniquet down and hemostasis was obtained with electrocautery.  The arthrotomy was then closed with interrupted #1 Vicryl suture followed by 0 Vicryl to close the deep tissue and 2-0 Vicryl to close subcutaneous tissue.  The skin was closed with staples.  Well-padded sterile dressings applied.  The patient was waken, extubated and taken to recovery room in stable condition.  Rexene Edison, PA-C did assist during the entire case and beginning to end and his assistance was crucial and medically necessary for soft tissue management and retraction, helping guide  implant placement and a layered closure of the wound.

## 2023-08-21 NOTE — Anesthesia Procedure Notes (Signed)
Anesthesia Regional Block: Adductor canal block   Pre-Anesthetic Checklist: , timeout performed,  Correct Patient, Correct Site, Correct Laterality,  Correct Procedure, Correct Position, site marked,  Risks and benefits discussed,  Pre-op evaluation,  At surgeon's request and post-op pain management  Laterality: Right  Prep: Maximum Sterile Barrier Precautions used, chloraprep       Needles:  Injection technique: Single-shot  Needle Type: Echogenic Stimulator Needle     Needle Length: 9cm  Needle Gauge: 21     Additional Needles:   Procedures:,,,, ultrasound used (permanent image in chart),,    Narrative:  Start time: 08/21/2023 12:11 PM End time: 08/21/2023 12:21 PM Injection made incrementally with aspirations every 5 mL.  Performed by: Personally  Anesthesiologist: Gaynelle Adu, MD

## 2023-08-21 NOTE — Interval H&P Note (Signed)
History and Physical Interval Note: The patient understands that she is here today for a right total knee replacement to treat her significant right knee arthritis.  There has been no acute or interval change in her medical status.  The risks and benefits of surgery have been discussed in detail and informed consent has been obtained.  The right operative knee has been marked.  08/21/2023 11:28 AM  Susan Woodward  has presented today for surgery, with the diagnosis of osteoarthritis right knee.  The various methods of treatment have been discussed with the patient and family. After consideration of risks, benefits and other options for treatment, the patient has consented to  Procedure(s): RIGHT TOTAL KNEE ARTHROPLASTY (Right) as a surgical intervention.  The patient's history has been reviewed, patient examined, no change in status, stable for surgery.  I have reviewed the patient's chart and labs.  Questions were answered to the patient's satisfaction.     Kathryne Hitch

## 2023-08-21 NOTE — Plan of Care (Signed)
  Problem: Coping: Goal: Level of anxiety will decrease Outcome: Progressing   Problem: Pain Managment: Goal: General experience of comfort will improve and/or be controlled Outcome: Progressing   Problem: Safety: Goal: Ability to remain free from injury will improve Outcome: Progressing   Problem: Skin Integrity: Goal: Risk for impaired skin integrity will decrease Outcome: Progressing

## 2023-08-21 NOTE — Anesthesia Preprocedure Evaluation (Addendum)
Anesthesia Evaluation  Patient identified by MRN, date of birth, ID band Patient awake    Reviewed: Allergy & Precautions, H&P , NPO status , Patient's Chart, lab work & pertinent test results  History of Anesthesia Complications (+) PONV and history of anesthetic complications  Airway Mallampati: II  TM Distance: >3 FB Neck ROM: Full    Dental no notable dental hx. (+) Teeth Intact, Dental Advisory Given   Pulmonary former smoker   Pulmonary exam normal breath sounds clear to auscultation       Cardiovascular hypertension, Pt. on medications  Rhythm:Regular Rate:Normal     Neuro/Psych negative neurological ROS  negative psych ROS   GI/Hepatic Neg liver ROS,GERD  Medicated,,  Endo/Other  negative endocrine ROS    Renal/GU negative Renal ROS  negative genitourinary   Musculoskeletal  (+) Arthritis , Osteoarthritis,    Abdominal   Peds  Hematology negative hematology ROS (+)   Anesthesia Other Findings   Reproductive/Obstetrics negative OB ROS                             Anesthesia Physical Anesthesia Plan  ASA: 2  Anesthesia Plan: Spinal   Post-op Pain Management: Regional block* and Tylenol PO (pre-op)*   Induction: Intravenous  PONV Risk Score and Plan: 4 or greater and Ondansetron, Dexamethasone, Propofol infusion and Midazolam  Airway Management Planned: Natural Airway and Simple Face Mask  Additional Equipment:   Intra-op Plan:   Post-operative Plan:   Informed Consent: I have reviewed the patients History and Physical, chart, labs and discussed the procedure including the risks, benefits and alternatives for the proposed anesthesia with the patient or authorized representative who has indicated his/her understanding and acceptance.     Dental advisory given  Plan Discussed with: CRNA  Anesthesia Plan Comments:        Anesthesia Quick Evaluation

## 2023-08-21 NOTE — Anesthesia Postprocedure Evaluation (Signed)
Anesthesia Post Note  Patient: Susan Woodward  Procedure(s) Performed: RIGHT TOTAL KNEE ARTHROPLASTY (Right: Knee)     Patient location during evaluation: PACU Anesthesia Type: Regional and General Level of consciousness: awake and alert Pain management: pain level controlled Vital Signs Assessment: post-procedure vital signs reviewed and stable Respiratory status: spontaneous breathing, nonlabored ventilation and respiratory function stable Cardiovascular status: blood pressure returned to baseline and stable Postop Assessment: no apparent nausea or vomiting Anesthetic complications: no  No notable events documented.  Last Vitals:  Vitals:   08/21/23 1600 08/21/23 1615  BP: 117/66 115/69  Pulse: 76 82  Resp: 11 12  Temp:    SpO2: 93% 96%    Last Pain:  Vitals:   08/21/23 1615  TempSrc:   PainSc: Asleep                 Olga Seyler,W. EDMOND

## 2023-08-21 NOTE — Transfer of Care (Signed)
Immediate Anesthesia Transfer of Care Note  Patient: [name]  Procedure(s) Performed: RIGHT TOTAL KNEE ARTHROPLASTY (Right: Knee)  Patient Location: PACU  Anesthesia Type:GA combined with regional for post-op pain  Level of Consciousness: awake and alert   Airway & Oxygen Therapy: Patient Spontanous Breathing and Patient connected to face mask oxygen  Post-op Assessment: Report given to RN and Post -op Vital signs reviewed and stable  Post vital signs: Reviewed and stable  Last Vitals:  Vitals Value Taken Time  BP 129/65 08/21/23 1534  Temp    Pulse 85 08/21/23 1535  Resp 12 08/21/23 1535  SpO2 100 % 08/21/23 1535  Vitals shown include unfiled device data.  Last Pain:  Vitals:   08/21/23 1245  TempSrc:   PainSc: 0-No pain         Complications: No notable events documented.

## 2023-08-21 NOTE — Anesthesia Procedure Notes (Signed)
Spinal  Patient location during procedure: OR Start time: 08/21/2023 1:37 PM End time: 08/21/2023 1:42 PM Reason for block: surgical anesthesia Staffing Performed: anesthesiologist  Anesthesiologist: Gaynelle Adu, MD Performed by: Gaynelle Adu, MD Authorized by: Gaynelle Adu, MD   Preanesthetic Checklist Completed: patient identified, IV checked, risks and benefits discussed, surgical consent, monitors and equipment checked, pre-op evaluation and timeout performed Spinal Block Patient position: sitting Prep: DuraPrep Patient monitoring: cardiac monitor, continuous pulse ox and blood pressure Approach: left paramedian Location: L3-4 Injection technique: single-shot Needle Needle type: Quincke  Needle gauge: 22 G Needle length: 9 cm Assessment Sensory level: T8 Events: CSF return Additional Notes Functioning IV was confirmed and monitors were applied. Sterile prep and drape, including hand hygiene and sterile gloves were used. The patient was positioned and the spine was prepped. The skin was anesthetized with lidocaine.  Free flow of clear CSF was obtained prior to injecting local anesthetic into the CSF.  The spinal needle aspirated freely following injection.  The needle was carefully withdrawn.  The patient tolerated the procedure well.

## 2023-08-21 NOTE — Anesthesia Procedure Notes (Signed)
Procedure Name: LMA Insertion Date/Time: 08/21/2023 2:04 PM  Performed by: Randa Evens, CRNAPre-anesthesia Checklist: Patient identified, Emergency Drugs available, Suction available and Patient being monitored Patient Re-evaluated:Patient Re-evaluated prior to induction Oxygen Delivery Method: Circle System Utilized Preoxygenation: Pre-oxygenation with 100% oxygen Induction Type: IV induction Ventilation: Mask ventilation without difficulty LMA: LMA inserted LMA Size: 4.0 Number of attempts: 1 Airway Equipment and Method: Bite block Placement Confirmation: positive ETCO2 Tube secured with: Tape Dental Injury: Teeth and Oropharynx as per pre-operative assessment

## 2023-08-22 ENCOUNTER — Other Ambulatory Visit: Payer: Self-pay | Admitting: Orthopaedic Surgery

## 2023-08-22 DIAGNOSIS — Z87891 Personal history of nicotine dependence: Secondary | ICD-10-CM | POA: Diagnosis not present

## 2023-08-22 DIAGNOSIS — M1711 Unilateral primary osteoarthritis, right knee: Secondary | ICD-10-CM | POA: Diagnosis not present

## 2023-08-22 DIAGNOSIS — I10 Essential (primary) hypertension: Secondary | ICD-10-CM | POA: Diagnosis not present

## 2023-08-22 DIAGNOSIS — Z7982 Long term (current) use of aspirin: Secondary | ICD-10-CM | POA: Diagnosis not present

## 2023-08-22 DIAGNOSIS — Z96642 Presence of left artificial hip joint: Secondary | ICD-10-CM | POA: Diagnosis not present

## 2023-08-22 DIAGNOSIS — Z79899 Other long term (current) drug therapy: Secondary | ICD-10-CM | POA: Diagnosis not present

## 2023-08-22 LAB — BASIC METABOLIC PANEL
Anion gap: 10 (ref 5–15)
BUN: 11 mg/dL (ref 8–23)
CO2: 21 mmol/L — ABNORMAL LOW (ref 22–32)
Calcium: 8.2 mg/dL — ABNORMAL LOW (ref 8.9–10.3)
Chloride: 104 mmol/L (ref 98–111)
Creatinine, Ser: 0.44 mg/dL (ref 0.44–1.00)
GFR, Estimated: >60 mL/min (ref >60)
Glucose, Bld: 149 mg/dL — ABNORMAL HIGH (ref 70–99)
Potassium: 4.3 mmol/L (ref 3.5–5.1)
Sodium: 135 mmol/L (ref 135–145)

## 2023-08-22 LAB — CBC
HCT: 35.2 % — ABNORMAL LOW (ref 36.0–46.0)
Hemoglobin: 11.1 g/dL — ABNORMAL LOW (ref 12.0–15.0)
MCH: 29.8 pg (ref 26.0–34.0)
MCHC: 31.5 g/dL (ref 30.0–36.0)
MCV: 94.6 fL (ref 80.0–100.0)
Platelets: 341 10*3/uL (ref 150–400)
RBC: 3.72 MIL/uL — ABNORMAL LOW (ref 3.87–5.11)
RDW: 12.8 % (ref 11.5–15.5)
WBC: 11.9 10*3/uL — ABNORMAL HIGH (ref 4.0–10.5)
nRBC: 0 % (ref 0.0–0.2)

## 2023-08-22 MED ORDER — PROMETHAZINE HCL 12.5 MG PO TABS: 12.5000 mg | ORAL_TABLET | Freq: Three times a day (TID) | ORAL | 0 refills | Status: AC | PRN

## 2023-08-22 MED ORDER — TIZANIDINE HCL 2 MG PO TABS: 2.0000 mg | ORAL_TABLET | Freq: Four times a day (QID) | ORAL | 1 refills | Status: AC | PRN

## 2023-08-22 MED ORDER — TRAMADOL HCL 50 MG PO TABS: 100.0000 mg | ORAL_TABLET | Freq: Four times a day (QID) | ORAL | 0 refills | Status: AC | PRN

## 2023-08-22 MED ORDER — ASPIRIN 81 MG PO CHEW: 81.0000 mg | CHEWABLE_TABLET | Freq: Two times a day (BID) | ORAL | 0 refills | Status: AC

## 2023-08-22 NOTE — Care Management Obs Status (Signed)
MEDICARE OBSERVATION STATUS NOTIFICATION   Patient Details  Name: Susan Woodward MRN: [id number] Date of Birth: 10/23/1955   Medicare Observation Status Notification Given:  Yes  This LCSW explained MOON to patient; Susan Woodward verbalized understanding, and signed document; copy of document given to patient.   Otelia Santee, LCSW 08/22/2023, 11:34 AM

## 2023-08-22 NOTE — Progress Notes (Signed)
Subjective: 1 Day Post-Op Procedure(s) (LRB): RIGHT TOTAL KNEE ARTHROPLASTY (Right) Patient reports pain as moderate.    Objective: Vital signs in last 24 hours: Temp:  [97.7 F (36.5 C)-98.3 F (36.8 C)] 97.9 F (36.6 C) (01/25 0933) Pulse Rate:  [70-87] 82 (01/25 0933) Resp:  [11-30] 16 (01/25 0933) BP: (102-129)/(53-76) 115/59 (01/25 0933) SpO2:  [93 %-100 %] 100 % (01/25 0933) Weight:  [63 kg] 63 kg (01/24 1648)  Intake/Output from previous day: 01/24 0701 - 01/25 0700 In: 453.8 [P.O.:240; I.V.:113.8; IV Piggyback:100] Out: 930 [Urine:900; Blood:30] Intake/Output this shift: Total I/O In: 240 [P.O.:240] Out: -   Recent Labs    08/22/23 0408  HGB 11.1*   Recent Labs    08/22/23 0408  WBC 11.9*  RBC 3.72*  HCT 35.2*  PLT 341   Recent Labs    08/22/23 0408  NA 135  K 4.3  CL 104  CO2 21*  BUN 11  CREATININE 0.44  GLUCOSE 149*  CALCIUM 8.2*   No results for input(s): "LABPT", "INR" in the last 72 hours.  Sensation intact distally Intact pulses distally Dorsiflexion/Plantar flexion intact Incision: dressing C/D/I Compartment soft   Assessment/Plan: 1 Day Post-Op Procedure(s) (LRB): RIGHT TOTAL KNEE ARTHROPLASTY (Right) Up with therapy Discharge home with home health this afternoon if clears PT.       Kathryne Hitch 08/22/2023, 11:49 AM

## 2023-08-22 NOTE — TOC Transition Note (Signed)
Transition of Care [facility]) - Discharge Note   Patient Details  Name: Susan Woodward MRN: [id number] Date of Birth: [birth year]  Transition [facility] CM/SW Contact:  Otelia Santee, LCSW Phone Number: 08/22/2023, 11:35 AM   Clinical Narrative:    Pt from home with spouse. Pt to receive HHPT w/ [facility]. Pt reports having RW and BSC at home and denies further DME needs. No further TOC needs identified.    Final next level of care: Home w Home Health Services Barriers to Discharge: No Barriers Identified   Patient Goals and CMS Choice Patient states their goals for this hospitalization and ongoing recovery are:: To return home CMS Medicare.gov Compare Post Acute Care list provided to:: Patient Choice offered to / list presented to : Patient [place] ownership interest in [facility].provided to::  (NA)    Discharge Placement                       Discharge Plan and Services Additional resources added to the After Visit Summary for                  DME Arranged: N/A DME Agency: NA       HH Arranged: PT HH Agency: [facility] Date HH Agency Contacted: 08/22/23 Time HH Agency Contacted: 1135 Representative spoke with at [facility] Agency: Haywood Lasso  Social Drivers of Health (SDOH) Interventions SDOH Screenings   Food Insecurity: No Food Insecurity (08/21/2023)  Housing: Low Risk  (08/21/2023)  Transportation Needs: No Transportation Needs (08/21/2023)  Utilities: Not At Risk (08/21/2023)  Tobacco Use: Medium Risk (08/21/2023)     Readmission Risk Interventions     No data to display

## 2023-08-22 NOTE — Evaluation (Signed)
Physical Therapy Evaluation Patient Details Name: Susan Woodward MRN: [id number] DOB: 1955/10/10 Today's Date: 08/22/2023  History of Present Illness  68 yo female s/p R TKA 05/20/24. Hx of L THA 2024, L4-L5 fusion 2022  Clinical Impression  On eval, pt required CGA for mobility She walked ~60 feet with a RW. Moderate pain at rest and with activity. Will plan to have a 2nd session prior to potential d/c home later today. Plan is for HHPT.         If plan is discharge home, recommend the following: A little help with walking and/or transfers;A little help with bathing/dressing/bathroom;Assist for transportation;Help with stairs or ramp for entrance;Assistance with cooking/housework   Can travel by private vehicle        Equipment Recommendations None recommended by PT  Recommendations for Other Services       Functional Status Assessment Patient has had a recent decline in their functional status and demonstrates the ability to make significant improvements in function in a reasonable and predictable amount of time.     Precautions / Restrictions Precautions Precautions: Fall;Knee Restrictions Weight Bearing Restrictions Per Provider Order: No RLE Weight Bearing Per Provider Order: Weight bearing as tolerated      Mobility  Bed Mobility Overal bed mobility: Needs Assistance Bed Mobility: Supine to Sit, Sit to Supine     Supine to sit: Supervision, HOB elevated, Used rails Sit to supine: Supervision, HOB elevated, Used rails   General bed mobility comments: Pt used gait belt to A R LE off/onto bed. Increased time. Cues provided    Transfers Overall transfer level: Needs assistance Equipment used: Rolling walker (2 wheels) Transfers: Sit to/from Stand Sit to Stand: Supervision           General transfer comment: Cues for safety, technique, hand/LE placement. Increased time.    Ambulation/Gait Ambulation/Gait assistance: Contact guard assist Gait Distance  (Feet): 60 Feet Assistive device: Rolling walker (2 wheels) Gait Pattern/deviations: Step-to pattern, Antalgic       General Gait Details: Cues for safety, sequencing. Slow gait speed. Distance limited by pain.  Stairs            Wheelchair Mobility     Tilt Bed    Modified Rankin (Stroke Patients Only)       Balance Overall balance assessment: Needs assistance         Standing balance support: Reliant on assistive device for balance Standing balance-Leahy Scale: Fair                               Pertinent Vitals/Pain Pain Assessment Pain Assessment: 0-10 Pain Score: 7  Pain Location: R knee Pain Descriptors / Indicators: Grimacing, Operative site guarding Pain Intervention(s): Limited activity within patient's tolerance, Ice applied, Repositioned    Home Living Family/patient expects to be discharged to:: Private residence Living Arrangements: Spouse/significant other Available Help at Discharge: Family Type of Home: House Home Access: Stairs to enter Entrance Stairs-Rails: None Secretary/administrator of Steps: 3   Home Layout: One level Home Equipment: Agricultural consultant (2 wheels)      Prior Function Prior Level of Function : Independent/Modified Independent             Mobility Comments: IND with all ADLs, self care tasks, IADLs, driving No AD       Extremity/Trunk Assessment   Upper Extremity Assessment Upper Extremity Assessment: Overall WFL for tasks assessed    Lower Extremity  Assessment Lower Extremity Assessment: Generalized weakness    Cervical / Trunk Assessment Cervical / Trunk Assessment: Normal  Communication   Communication Communication: No apparent difficulties  Cognition Arousal: Alert Behavior During Therapy: WFL for tasks assessed/performed Overall Cognitive Status: Within Functional Limits for tasks assessed                                          General Comments       Exercises Total Joint Exercises Ankle Circles/Pumps: AROM, Both, 10 reps Quad Sets: AROM, Right, 10 reps Heel Slides: AAROM, Right, 10 reps Hip ABduction/ADduction: AAROM, Right, 10 reps Straight Leg Raises: AAROM, Right, 10 reps Goniometric ROM: ~10-60 degrees   Assessment/Plan    PT Assessment Patient needs continued PT services  PT Problem List Decreased strength;Decreased range of motion;Decreased balance;Decreased mobility;Decreased activity tolerance;Pain;Decreased knowledge of use of DME       PT Treatment Interventions DME instruction;Gait training;Stair training;Functional mobility training;Therapeutic activities;Therapeutic exercise;Balance training;Patient/family education    PT Goals (Current goals can be found in the Care Plan section)  Acute Rehab PT Goals Patient Stated Goal: less pain. regain plof. PT Goal Formulation: With patient Time For Goal Achievement: 09/05/23 Potential to Achieve Goals: Good    Frequency Min 1X/week     Co-evaluation               AM-PAC PT "6 Clicks" Mobility  Outcome Measure Help needed turning from your back to your side while in a flat bed without using bedrails?: A Little Help needed moving from lying on your back to sitting on the side of a flat bed without using bedrails?: A Little Help needed moving to and from a bed to a chair (including a wheelchair)?: A Little Help needed standing up from a chair using your arms (e.g., wheelchair or bedside chair)?: A Little Help needed to walk in hospital room?: A Little Help needed climbing 3-5 steps with a railing? : A Little 6 Click Score: 18    End of Session Equipment Utilized During Treatment: Gait belt Activity Tolerance: Patient tolerated treatment well;Patient limited by pain Patient left: in bed;with call bell/phone within reach   PT Visit Diagnosis: Pain;Other abnormalities of gait and mobility (R26.89) Pain - Right/Left: Right Pain - part of body: Knee    Time:  1610-9604 PT Time Calculation (min) (ACUTE ONLY): 25 min   Charges:   PT Evaluation $PT Eval Low Complexity: 1 Low PT Treatments $Gait Training: 8-22 mins PT General Charges $$ ACUTE PT VISIT: 1 Visit           Faye Ramsay, PT Acute Rehabilitation  Office: [phone]

## 2023-08-22 NOTE — Progress Notes (Signed)
Physical Therapy Treatment Patient Details Name: Susan Woodward MRN: [id number] DOB: 04/27/1956 Today's Date: 08/22/2023   History of Present Illness 68 yo female s/p R TKA 05/20/24. Hx of L THA 2024, L4-L5 fusion 2022    PT Comments  2nd session to practice stair negotiation. Husband present to observe. Moderate pain with activity. Encouraged pt to mobilize often at home, as tolerated. Plan is for HHPT.     If plan is discharge home, recommend the following: A little help with walking and/or transfers;A little help with bathing/dressing/bathroom;Assist for transportation;Help with stairs or ramp for entrance;Assistance with cooking/housework   Can travel by private vehicle        Equipment Recommendations  None recommended by PT    Recommendations for Other Services       Precautions / Restrictions Precautions Precautions: Fall;Knee Restrictions Weight Bearing Restrictions Per Provider Order: No RLE Weight Bearing Per Provider Order: Weight bearing as tolerated     Mobility  Bed Mobility Overal bed mobility: Needs Assistance Bed Mobility: Supine to Sit, Sit to Supine     Supine to sit: Supervision, HOB elevated, Used rails Sit to supine: Supervision, HOB elevated, Used rails   General bed mobility comments: pt sitting EOB    Transfers Overall transfer level: Needs assistance Equipment used: Rolling walker (2 wheels) Transfers: Sit to/from Stand Sit to Stand: Supervision           General transfer comment: Cues for safety, technique, hand/LE placement. Increased time.    Ambulation/Gait Ambulation/Gait assistance: Supervision Gait Distance (Feet): 60 Feet Assistive device: Rolling walker (2 wheels) Gait Pattern/deviations: Step-to pattern       General Gait Details: Cues for safety, sequencing. Slow gait speed.   Stairs Stairs: Yes Stairs assistance: Min assist Stair Management: Forwards, With walker Number of Stairs: 3 General stair comments: up  and down 3 stairs with use of RW. cues for safety, technique, sequencing. husband present to observe.   Wheelchair Mobility     Tilt Bed    Modified Rankin (Stroke Patients Only)       Balance Overall balance assessment: Needs assistance         Standing balance support: Reliant on assistive device for balance Standing balance-Leahy Scale: Fair                              Cognition Arousal: Alert Behavior During Therapy: WFL for tasks assessed/performed Overall Cognitive Status: Within Functional Limits for tasks assessed                                          Exercises Total Joint Exercises Ankle Circles/Pumps: AROM, Both, 10 reps Quad Sets: AROM, Right, 10 reps Heel Slides: AAROM, Right, 10 reps Hip ABduction/ADduction: AAROM, Right, 10 reps Straight Leg Raises: AAROM, Right, 10 reps Goniometric ROM: ~10-60 degrees    General Comments        Pertinent Vitals/Pain Pain Assessment Pain Assessment: No/denies pain Pain Score: 7  Pain Location: R knee Pain Descriptors / Indicators: Grimacing, Operative site guarding Pain Intervention(s): Limited activity within patient's tolerance, Monitored during session, Repositioned    Home Living Family/patient expects to be discharged to:: Private residence Living Arrangements: Spouse/significant other Available Help at Discharge: Family Type of Home: House Home Access: Stairs to enter Entrance Stairs-Rails: None Entrance Stairs-Number of Steps: 3  Home Layout: One level Home Equipment: Agricultural consultant (2 wheels)      Prior Function            PT Goals (current goals can now be found in the care plan section) Acute Rehab PT Goals Patient Stated Goal: less pain. regain plof. PT Goal Formulation: With patient Time For Goal Achievement: 09/05/23 Potential to Achieve Goals: Good Progress towards PT goals: Progressing toward goals    Frequency    Min 1X/week      PT  Plan      Co-evaluation              AM-PAC PT "6 Clicks" Mobility   Outcome Measure  Help needed turning from your back to your side while in a flat bed without using bedrails?: A Little Help needed moving from lying on your back to sitting on the side of a flat bed without using bedrails?: A Little Help needed moving to and from a bed to a chair (including a wheelchair)?: A Little Help needed standing up from a chair using your arms (e.g., wheelchair or bedside chair)?: A Little Help needed to walk in hospital room?: A Little Help needed climbing 3-5 steps with a railing? : A Little 6 Click Score: 18    End of Session Equipment Utilized During Treatment: Gait belt Activity Tolerance: Patient tolerated treatment well Patient left: in bed;with call bell/phone within reach;with family/visitor present   PT Visit Diagnosis: Pain;Other abnormalities of gait and mobility (R26.89) Pain - Right/Left: Right Pain - part of body: Knee     Time: 4098-1191 PT Time Calculation (min) (ACUTE ONLY): 10 min  Charges:    $Gait Training: 8-22 mins PT General Charges $$ ACUTE PT VISIT: 1 Visit                        Faye Ramsay, PT Acute Rehabilitation  Office: [phone]

## 2023-08-22 NOTE — Plan of Care (Signed)
  Problem: Coping: Goal: Level of anxiety will decrease Outcome: Progressing   Problem: Pain Managment: Goal: General experience of comfort will improve and/or be controlled Outcome: Progressing   Problem: Safety: Goal: Ability to remain free from injury will improve Outcome: Progressing

## 2023-08-22 NOTE — Discharge Instructions (Signed)
Per [facility] clinic policy, our goal is ensure optimal postoperative pain control with a multimodal pain management strategy. For all [facility] patients, our goal is to wean post-operative narcotic medications by 6 weeks post-operatively. If this is not possible due to utilization of pain medication prior to surgery, your [facility] doctor will support your acute post-operative pain control for the first 6 weeks postoperatively, with a plan to transition you back to your primary pain team following that. Susan Woodward will work to ensure a Therapist, occupational.  INSTRUCTIONS AFTER JOINT REPLACEMENT   Remove items at home which could result in a fall. This includes throw rugs or furniture in walking pathways ICE to the affected joint every three hours while awake for 30 minutes at a time, for at least the first 3-5 days, and then as needed for pain and swelling.  Continue to use ice for pain and swelling. You may notice swelling that will progress down to the foot and ankle.  This is normal after surgery.  Elevate your leg when you are not up walking on it.   Continue to use the breathing machine you got in the hospital (incentive spirometer) which will help keep your temperature down.  It is common for your temperature to cycle up and down following surgery, especially at night when you are not up moving around and exerting yourself.  The breathing machine keeps your lungs expanded and your temperature down.   DIET:  As you were doing prior to hospitalization, we recommend a well-balanced diet.  DRESSING / WOUND CARE / SHOWERING  Keep the surgical dressing until follow up.  The dressing is water proof, so you can shower without any extra covering.  IF THE DRESSING FALLS OFF or the wound gets wet inside, change the dressing with sterile gauze.  Please use good hand washing techniques before changing the dressing.  Do not use any lotions or creams on the incision until instructed by your surgeon.     ACTIVITY  Increase activity slowly as tolerated, but follow the weight bearing instructions below.   No driving for 6 weeks or until further direction given by your physician.  You cannot drive while taking narcotics.  No lifting or carrying greater than 10 lbs. until further directed by your surgeon. Avoid periods of inactivity such as sitting longer than an hour when not asleep. This helps prevent blood clots.  You may return to work once you are authorized by your doctor.     WEIGHT BEARING   Weight bearing as tolerated with assist device (walker, cane, etc) as directed, use it as long as suggested by your surgeon or therapist, typically at least 4-6 weeks.   EXERCISES  Results after joint replacement surgery are often greatly improved when you follow the exercise, range of motion and muscle strengthening exercises prescribed by your doctor. Safety measures are also important to protect the joint from further injury. Any time any of these exercises cause you to have increased pain or swelling, decrease what you are doing until you are comfortable again and then slowly increase them. If you have problems or questions, call your caregiver or physical therapist for advice.   Rehabilitation is important following a joint replacement. After just a few days of immobilization, the muscles of the leg can become weakened and shrink (atrophy).  These exercises are designed to build up the tone and strength of the thigh and leg muscles and to improve motion. Often times heat used for twenty to thirty minutes before  working out will loosen up your tissues and help with improving the range of motion but do not use heat for the first two weeks following surgery (sometimes heat can increase post-operative swelling).   These exercises can be done on a training (exercise) mat, on the floor, on a table or on a bed. Use whatever works the best and is most comfortable for you.    Use music or television  while you are exercising so that the exercises are a pleasant break in your day. This will make your life better with the exercises acting as a break in your routine that you can look forward to.   Perform all exercises about fifteen times, three times per day or as directed.  You should exercise both the operative leg and the other leg as well.  Exercises include:   Quad Sets - Tighten up the muscle on the front of the thigh (Quad) and hold for 5-10 seconds.   Straight Leg Raises - With your knee straight (if you were given a brace, keep it on), lift the leg to 60 degrees, hold for 3 seconds, and slowly lower the leg.  Perform this exercise against resistance later as your leg gets stronger.  Leg Slides: Lying on your back, slowly slide your foot toward your buttocks, bending your knee up off the floor (only go as far as is comfortable). Then slowly slide your foot back down until your leg is flat on the floor again.  Angel Wings: Lying on your back spread your legs to the side as far apart as you can without causing discomfort.  Hamstring Strength:  Lying on your back, push your heel against the floor with your leg straight by tightening up the muscles of your buttocks.  Repeat, but this time bend your knee to a comfortable angle, and push your heel against the floor.  You may put a pillow under the heel to make it more comfortable if necessary.   A rehabilitation program following joint replacement surgery can speed recovery and prevent re-injury in the future due to weakened muscles. Contact your doctor or a physical therapist for more information on knee rehabilitation.    CONSTIPATION  Constipation is defined medically as fewer than three stools per week and severe constipation as less than one stool per week.  Even if you have a regular bowel pattern at home, your normal regimen is likely to be disrupted due to multiple reasons following surgery.  Combination of anesthesia, postoperative  narcotics, change in appetite and fluid intake all can affect your bowels.   YOU MUST use at least one of the following options; they are listed in order of increasing strength to get the job done.  They are all available over the counter, and you may need to use some, POSSIBLY even all of these options:    Drink plenty of fluids (prune juice may be helpful) and high fiber foods Colace 100 mg by mouth twice a day  Senokot for constipation as directed and as needed Dulcolax (bisacodyl), take with full glass of water  Miralax (polyethylene glycol) once or twice a day as needed.  If you have tried all these things and are unable to have a bowel movement in the first 3-4 days after surgery call either your surgeon or your primary doctor.    If you experience loose stools or diarrhea, hold the medications until you stool forms back up.  If your symptoms do not get better within 1 week  or if they get worse, check with your doctor.  If you experience "the worst abdominal pain ever" or develop nausea or vomiting, please contact the office immediately for further recommendations for treatment.   ITCHING:  If you experience itching with your medications, try taking only a single pain pill, or even half a pain pill at a time.  You can also use Benadryl over the counter for itching or also to help with sleep.   TED HOSE STOCKINGS:  Use stockings on both legs until for at least 2 weeks or as directed by physician office. They may be removed at night for sleeping.  MEDICATIONS:  See your medication summary on the "After Visit Summary" that nursing will review with you.  You may have some home medications which will be placed on hold until you complete the course of blood thinner medication.  It is important for you to complete the blood thinner medication as prescribed.  PRECAUTIONS:  If you experience chest pain or shortness of breath - call 911 immediately for transfer to the hospital emergency department.    If you develop a fever greater that 101 F, purulent drainage from wound, increased redness or drainage from wound, foul odor from the wound/dressing, or calf pain - CONTACT YOUR SURGEON.                                                   FOLLOW-UP APPOINTMENTS:  If you do not already have a post-op appointment, please call the office for an appointment to be seen by your surgeon.  Guidelines for how soon to be seen are listed in your "After Visit Summary", but are typically between 1-4 weeks after surgery.  OTHER INSTRUCTIONS:   Knee Replacement:  Do not place pillow under knee, focus on keeping the knee straight while resting. CPM instructions: 0-90 degrees, 2 hours in the morning, 2 hours in the afternoon, and 2 hours in the evening. Place foam block, curve side up under heel at all times except when in CPM or when walking.  DO NOT modify, tear, cut, or change the foam block in any way.  POST-OPERATIVE OPIOID TAPER INSTRUCTIONS: It is important to wean off of your opioid medication as soon as possible. If you do not need pain medication after your surgery it is ok to stop day one. Opioids include: Codeine, Hydrocodone(Norco, Vicodin), Oxycodone(Percocet, oxycontin) and hydromorphone amongst others.  Long term and even short term use of opiods can cause: Increased pain response Dependence Constipation Depression Respiratory depression And more.  Withdrawal symptoms can include Flu like symptoms Nausea, vomiting And more Techniques to manage these symptoms Hydrate well Eat regular healthy meals Stay active Use relaxation techniques(deep breathing, meditating, yoga) Do Not substitute Alcohol to help with tapering If you have been on opioids for less than two weeks and do not have pain than it is ok to stop all together.  Plan to wean off of opioids This plan should start within one week post op of your joint replacement. Maintain the same interval or time between taking each dose  and first decrease the dose.  Cut the total daily intake of opioids by one tablet each day Next start to increase the time between doses. The last dose that should be eliminated is the evening dose.   MAKE SURE YOU:  Understand these instructions.  Get help right away if you are not doing well or get worse.    Thank you for letting us be a part of your medical care team.  It is a privilege we respect greatly.  We hope these instructions will help you stay on track for a fast and full recovery!      Dental Antibiotics:  In most cases prophylactic antibiotics for Dental procdeures after total joint surgery are not necessary.  Exceptions are as follows:  1. History of prior total joint infection  2. Severely immunocompromised (Organ Transplant, cancer chemotherapy, Rheumatoid biologic meds such as Humera)  3. Poorly controlled diabetes (A1C &gt; 8.0, blood glucose over 200)  If you have one of these conditions, contact your surgeon for an antibiotic prescription, prior to your dental procedure.

## 2023-08-22 NOTE — Discharge Summary (Signed)
Patient ID: Susan Woodward MRN: [id number] DOB/AGE: 1956/05/07 68 y.o.  Admit date: 08/21/2023 Discharge date: 08/22/2023  Admission Diagnoses:  Principal Problem:   Unilateral primary osteoarthritis, right knee Active Problems:   Status post right knee replacement   Discharge Diagnoses:  Same  Past Medical History:  Diagnosis Date   Arthritis    GERD (gastroesophageal reflux disease)    Hypertension    Osteoporosis    PONV (postoperative nausea and vomiting)     Surgeries: Procedure(s): RIGHT TOTAL KNEE ARTHROPLASTY on 08/21/2023   Consultants:   Discharged Condition: Improved  Hospital Course: ALTA GODING is an 68 y.o. female who was admitted 08/21/2023 for operative treatment ofUnilateral primary osteoarthritis, right knee. Patient has severe unremitting pain that affects sleep, daily activities, and work/hobbies. After pre-op clearance the patient was taken to the operating room on 08/21/2023 and underwent  Procedure(s): RIGHT TOTAL KNEE ARTHROPLASTY.    Patient was given perioperative antibiotics:  Anti-infectives (From admission, onward)    Start     Dose/Rate Route Frequency Ordered Stop   08/21/23 2000  ceFAZolin (ANCEF) IVPB 2g/100 mL premix        2 g 200 mL/hr over 30 Minutes Intravenous Every 6 hours 08/21/23 1738 08/22/23 0231   08/21/23 1100  ceFAZolin (ANCEF) IVPB 2g/100 mL premix        2 g 200 mL/hr over 30 Minutes Intravenous On call to O.R. 08/21/23 1048 08/21/23 1345        Patient was given sequential compression devices, early ambulation, and chemoprophylaxis to prevent DVT.  Patient benefited maximally from hospital stay and there were no complications.    Recent vital signs: Patient Vitals for the past 24 hrs:  BP Temp Temp src Pulse Resp SpO2  08/22/23 1323 130/68 98.8 F (37.1 C) -- 87 16 92 %  08/22/23 0933 (!) 115/59 97.9 F (36.6 C) -- 82 16 100 %  08/22/23 0610 109/64 97.9 F (36.6 C) -- 87 17 97 %  08/22/23 0200 126/74 98.1 F  (36.7 C) -- 84 16 99 %  08/21/23 2115 122/76 98.3 F (36.8 C) Oral 81 16 100 %  08/21/23 1842 126/74 97.7 F (36.5 C) -- 76 18 100 %     Recent laboratory studies:  Recent Labs    08/22/23 0408  WBC 11.9*  HGB 11.1*  HCT 35.2*  PLT 341  NA 135  K 4.3  CL 104  CO2 21*  BUN 11  CREATININE 0.44  GLUCOSE 149*  CALCIUM 8.2*     Discharge Medications:   Allergies as of 08/22/2023   No Known Allergies      Medication List     TAKE these medications    acetaminophen 650 MG CR tablet Commonly known as: TYLENOL Take 1,300 mg by mouth in the morning, at noon, and at bedtime.   aspirin 81 MG chewable tablet Chew 1 tablet (81 mg total) by mouth 2 (two) times daily.   b complex vitamins capsule Take 1 capsule by mouth daily.   CALCIUM 600+D3 PLUS MINERALS PO Take 1 tablet by mouth daily.   denosumab 60 MG/ML Sosy injection Commonly known as: PROLIA Inject 60 mg into the skin every 6 (six) months.   omeprazole 40 MG capsule Commonly known as: PRILOSEC Take 40 mg by mouth daily.   promethazine 12.5 MG tablet Commonly known as: PHENERGAN Take 1 tablet (12.5 mg total) by mouth every 8 (eight) hours as needed for nausea or vomiting.   tiZANidine  2 MG tablet Commonly known as: ZANAFLEX Take 1 tablet (2 mg total) by mouth every 6 (six) hours as needed for muscle spasms.   traMADol 50 MG tablet Commonly known as: ULTRAM Take 2 tablets (100 mg total) by mouth every 6 (six) hours as needed.   TURMERIC PO Take 1,000 mg by mouth in the morning.   valsartan 80 MG tablet Commonly known as: DIOVAN Take 80 mg by mouth in the morning.   Vitamin D3 10 MCG (400 UNIT) Caps Take 400 Units by mouth daily.               Durable Medical Equipment  (From admission, onward)           Start     Ordered   08/21/23 1739  DME 3 n 1  Once        08/21/23 1738   08/21/23 1739  DME Walker rolling  Once       Question Answer Comment  Walker: With 5 Inch Wheels    Patient needs a walker to treat with the following condition Status post total right knee replacement      08/21/23 1738            Diagnostic Studies: DG Knee Right Port Result Date: 08/21/2023 CLINICAL DATA:  Total knee replacement. EXAM: PORTABLE RIGHT KNEE - 1-2 VIEW COMPARISON:  None Available. FINDINGS: Right knee arthroplasty in expected alignment. No periprosthetic lucency or fracture. Recent postsurgical change includes air and edema in the soft tissues and joint space. Anterior skin staples. IMPRESSION: Right knee arthroplasty without immediate postoperative complication. Electronically Signed   By: Narda Rutherford M.D.   On: 08/21/2023 17:16    Disposition: Discharge disposition: 01-Home or Self Care          Follow-up Information     Kathryne Hitch, MD Follow up in 2 week(s).   Specialty: Orthopedic Surgery Contact information: [address] [place] [address] [phone]                  Signed: Kathryne Hitch 08/22/2023, 5:30 PM

## 2023-08-23 DIAGNOSIS — Z96642 Presence of left artificial hip joint: Secondary | ICD-10-CM | POA: Diagnosis not present

## 2023-08-23 DIAGNOSIS — Z9181 History of falling: Secondary | ICD-10-CM | POA: Diagnosis not present

## 2023-08-23 DIAGNOSIS — Z7982 Long term (current) use of aspirin: Secondary | ICD-10-CM | POA: Diagnosis not present

## 2023-08-23 DIAGNOSIS — Z981 Arthrodesis status: Secondary | ICD-10-CM | POA: Diagnosis not present

## 2023-08-23 DIAGNOSIS — Z471 Aftercare following joint replacement surgery: Secondary | ICD-10-CM | POA: Diagnosis not present

## 2023-08-23 DIAGNOSIS — M48061 Spinal stenosis, lumbar region without neurogenic claudication: Secondary | ICD-10-CM | POA: Diagnosis not present

## 2023-08-23 DIAGNOSIS — H269 Unspecified cataract: Secondary | ICD-10-CM | POA: Diagnosis not present

## 2023-08-23 DIAGNOSIS — Z96651 Presence of right artificial knee joint: Secondary | ICD-10-CM | POA: Diagnosis not present

## 2023-08-23 DIAGNOSIS — Z87891 Personal history of nicotine dependence: Secondary | ICD-10-CM | POA: Diagnosis not present

## 2023-08-23 DIAGNOSIS — M4310 Spondylolisthesis, site unspecified: Secondary | ICD-10-CM | POA: Diagnosis not present

## 2023-08-23 DIAGNOSIS — M810 Age-related osteoporosis without current pathological fracture: Secondary | ICD-10-CM | POA: Diagnosis not present

## 2023-08-23 DIAGNOSIS — I10 Essential (primary) hypertension: Secondary | ICD-10-CM | POA: Diagnosis not present

## 2023-08-23 DIAGNOSIS — K219 Gastro-esophageal reflux disease without esophagitis: Secondary | ICD-10-CM | POA: Diagnosis not present

## 2023-08-24 ENCOUNTER — Encounter (HOSPITAL_COMMUNITY): Payer: Self-pay | Admitting: Orthopaedic Surgery

## 2023-09-01 DIAGNOSIS — I10 Essential (primary) hypertension: Secondary | ICD-10-CM | POA: Diagnosis not present

## 2023-09-01 DIAGNOSIS — Z981 Arthrodesis status: Secondary | ICD-10-CM | POA: Diagnosis not present

## 2023-09-01 DIAGNOSIS — M48061 Spinal stenosis, lumbar region without neurogenic claudication: Secondary | ICD-10-CM | POA: Diagnosis not present

## 2023-09-01 DIAGNOSIS — Z87891 Personal history of nicotine dependence: Secondary | ICD-10-CM | POA: Diagnosis not present

## 2023-09-01 DIAGNOSIS — Z96651 Presence of right artificial knee joint: Secondary | ICD-10-CM | POA: Diagnosis not present

## 2023-09-01 DIAGNOSIS — Z96642 Presence of left artificial hip joint: Secondary | ICD-10-CM | POA: Diagnosis not present

## 2023-09-01 DIAGNOSIS — Z471 Aftercare following joint replacement surgery: Secondary | ICD-10-CM | POA: Diagnosis not present

## 2023-09-01 DIAGNOSIS — M810 Age-related osteoporosis without current pathological fracture: Secondary | ICD-10-CM | POA: Diagnosis not present

## 2023-09-01 DIAGNOSIS — H269 Unspecified cataract: Secondary | ICD-10-CM | POA: Diagnosis not present

## 2023-09-01 DIAGNOSIS — K219 Gastro-esophageal reflux disease without esophagitis: Secondary | ICD-10-CM | POA: Diagnosis not present

## 2023-09-01 DIAGNOSIS — Z7982 Long term (current) use of aspirin: Secondary | ICD-10-CM | POA: Diagnosis not present

## 2023-09-01 DIAGNOSIS — Z9181 History of falling: Secondary | ICD-10-CM | POA: Diagnosis not present

## 2023-09-01 DIAGNOSIS — M4310 Spondylolisthesis, site unspecified: Secondary | ICD-10-CM | POA: Diagnosis not present

## 2023-09-03 ENCOUNTER — Encounter: Payer: Self-pay | Admitting: Orthopaedic Surgery

## 2023-09-03 ENCOUNTER — Ambulatory Visit: Payer: Medicare Other | Admitting: Orthopaedic Surgery

## 2023-09-03 DIAGNOSIS — Z7982 Long term (current) use of aspirin: Secondary | ICD-10-CM | POA: Diagnosis not present

## 2023-09-03 DIAGNOSIS — H269 Unspecified cataract: Secondary | ICD-10-CM | POA: Diagnosis not present

## 2023-09-03 DIAGNOSIS — I10 Essential (primary) hypertension: Secondary | ICD-10-CM | POA: Diagnosis not present

## 2023-09-03 DIAGNOSIS — M4310 Spondylolisthesis, site unspecified: Secondary | ICD-10-CM | POA: Diagnosis not present

## 2023-09-03 DIAGNOSIS — M48061 Spinal stenosis, lumbar region without neurogenic claudication: Secondary | ICD-10-CM | POA: Diagnosis not present

## 2023-09-03 DIAGNOSIS — Z471 Aftercare following joint replacement surgery: Secondary | ICD-10-CM | POA: Diagnosis not present

## 2023-09-03 DIAGNOSIS — Z96651 Presence of right artificial knee joint: Secondary | ICD-10-CM | POA: Diagnosis not present

## 2023-09-03 DIAGNOSIS — K219 Gastro-esophageal reflux disease without esophagitis: Secondary | ICD-10-CM | POA: Diagnosis not present

## 2023-09-03 DIAGNOSIS — Z96642 Presence of left artificial hip joint: Secondary | ICD-10-CM | POA: Diagnosis not present

## 2023-09-03 DIAGNOSIS — Z9181 History of falling: Secondary | ICD-10-CM | POA: Diagnosis not present

## 2023-09-03 DIAGNOSIS — Z87891 Personal history of nicotine dependence: Secondary | ICD-10-CM | POA: Diagnosis not present

## 2023-09-03 DIAGNOSIS — Z981 Arthrodesis status: Secondary | ICD-10-CM | POA: Diagnosis not present

## 2023-09-03 DIAGNOSIS — M810 Age-related osteoporosis without current pathological fracture: Secondary | ICD-10-CM | POA: Diagnosis not present

## 2023-09-03 NOTE — Progress Notes (Signed)
 The patient is here today for first postoperative follow-up visit status post a right total knee replacement.  She is someone who does not tolerate pain medications at all so she is not really taking any.  She has been having home health therapy.  Her right calf is soft.  Her right knee has almost full extension and we can flex her to about 70 degrees.  Incision looks good.  Staples were removed and Steri-Strips applied.  She knows to wait at least 2-3 more weeks before driving.  We will work on setting her up for outpatient physical therapy for aggressive range of motion of her right knee as well as balance and coordination and strengthening.  We will see her back in a month to see how she is doing from a range of motion standpoint but no x-rays are needed.

## 2023-09-04 ENCOUNTER — Other Ambulatory Visit: Payer: Self-pay

## 2023-09-04 DIAGNOSIS — Z96651 Presence of right artificial knee joint: Secondary | ICD-10-CM

## 2023-09-07 ENCOUNTER — Ambulatory Visit: Payer: Medicare Other | Admitting: Physical Therapy

## 2023-09-07 NOTE — Therapy (Signed)
OUTPATIENT PHYSICAL THERAPY LOWER EXTREMITY EVALUATION   Patient Name: Susan Woodward MRN: [id number] DOB:[birth year], 68 y.o., female Today's Date: 09/08/2023  END OF SESSION:  PT End of Session - 09/08/23 1126     Visit Number 1    Date for PT Re-Evaluation 11/03/23    Authorization Type BCBS Medicare    Progress Note Due on Visit 10    PT Start Time 1017    PT Stop Time 1054    PT Time Calculation (min) 37 min    Activity Tolerance Patient tolerated treatment well    Behavior During Therapy WFL for tasks assessed/performed             Past Medical History:  Diagnosis Date   Arthritis    GERD (gastroesophageal reflux disease)    Hypertension    Osteoporosis    PONV (postoperative nausea and vomiting)    Past Surgical History:  Procedure Laterality Date   BACK SURGERY  08/16/2020   L4-5 fusion by Dr. Lelon Perla   TOTAL HIP ARTHROPLASTY Left 01/02/2023   Procedure: LEFT TOTAL HIP ARTHROPLASTY ANTERIOR APPROACH;  Surgeon: Kathryne Hitch, MD;  Location: [facility];  Service: Orthopedics;  Laterality: Left;   TOTAL KNEE ARTHROPLASTY Right 08/21/2023   Procedure: RIGHT TOTAL KNEE ARTHROPLASTY;  Surgeon: Kathryne Hitch, MD;  Location: [facility];  Service: Orthopedics;  Laterality: Right;   WRIST SURGERY Right    12 years ago   Patient Active Problem List   Diagnosis Date Noted   Status post right knee replacement 08/21/2023   Status post total replacement of left hip 01/02/2023   Degenerative spondylolisthesis 08/26/2021   Spinal stenosis of lumbar region 07/13/2017    PCP: Soundra Pilon, FNP  REFERRING PROVIDER: Doneen Poisson, MD  REFERRING DIAG:  Diagnosis  [phone] (ICD-10-CM) - Status post right knee replacement    THERAPY DIAG:  Difficulty in walking, not elsewhere classified - Plan: PT plan of care cert/re-cert  Muscle weakness (generalized) - Plan: PT plan of care cert/re-cert  Other abnormalities of gait and mobility - Plan: PT  plan of care cert/re-cert  Acute pain of right knee - Plan: PT plan of care cert/re-cert  Localized edema - Plan: PT plan of care cert/re-cert  Stiffness of right knee, not elsewhere classified - Plan: PT plan of care cert/re-cert  Rationale for Evaluation and Treatment: Rehabilitation  ONSET DATE: surgery for TKA on 08/21/23, chronic OA   SUBJECTIVE:   SUBJECTIVE STATEMENT: Pt present to PT s/p Rt TKA performed on 08/21/23.  Pt had home health PT after surgery  From MD visit on 09/03/23:  The patient is here today for first postoperative follow-up visit status post a right total knee replacement.  She is someone who does not tolerate pain medications at all so she is not really taking any.  She has been having home health therapy.   Her right calf is soft.  Her right knee has almost full extension and we can flex her to about 70 degrees.  Incision looks good.  Staples were removed and Steri-Strips applied.      PERTINENT HISTORY: GERD, HTN, osteoporosis, L4-5 fusion 08/2020, Lt Total hip replacement 01/02/23  PAIN: 09/08/23 Are you having pain? Yes: NPRS scale: 0-5/10 Pain location: Rt knee  Pain description: dull ache Aggravating factors: exercise, movement, walking for long periods  Relieving factors: ice, Tylenol   PRECAUTIONS: None  RED FLAGS: None   WEIGHT BEARING RESTRICTIONS: No  FALLS:  Has patient fallen  in last 6 months? No  LIVING ENVIRONMENT: Lives with: lives with their spouse Lives in: House/apartment Stairs: Yes: External: 3 steps; none Has following equipment at home: Single point cane and Walker - 2 wheeled  OCCUPATION: retired   PLOF: Independent, Vocation/Vocational requirements: retired , and Leisure: yardwork, walking for exercise   PATIENT GOALS: return to prior level of function, reduce pain  NEXT MD VISIT: 10/01/23  OBJECTIVE:  Note: Objective measures were completed at Evaluation unless otherwise noted.  DIAGNOSTIC FINDINGS: NA  PATIENT  SURVEYS:  09/08/23: LEFS 28/80=35%  COGNITION: Overall cognitive status: Within functional limits for tasks assessed     SENSATION: WFL  EDEMA:  Minimal post-op edema   MUSCLE LENGTH: Limited hamstring length due to edema and post-op  POSTURE: No Significant postural limitations  PALPATION: Good patellar mobility on the Rt -medial/lateral.  Reduced mobility with superior/inferior.  Mild edema about the Rt knee joint.  Tension and tenderness over distal hamstring insertions.  Well healing surgical incision.    LOWER EXTREMITY ROM:  Passive ROM Right eval Left eval  Hip flexion Bil hip flexibility is WFLs Full  Hip extension    Hip abduction    Hip adduction    Hip internal rotation    Hip external rotation    Knee flexion 85   Knee extension 8   Ankle dorsiflexion    Ankle plantarflexion    Ankle inversion    Ankle eversion     (Blank rows = not tested)  LOWER EXTREMITY MMT:  MMT Right eval Left eval  Hip flexion 4 4+  Hip extension 4+ 4+  Hip abduction 4 4+  Hip adduction    Hip internal rotation 4+ 4+  Hip external rotation 4+ 4+  Knee flexion 4 4+  Knee extension 4+ 4+  Ankle dorsiflexion    Ankle plantarflexion    Ankle inversion    Ankle eversion     (Blank rows = not tested)  GAIT: Distance walked: 50 Assistive device utilized:  none Level of assistance: Modified independence Comments: reduced time on Rt with reduced step length on Rt.  Circumduction of Rt LE with stepping up on step with Rt                                                                                                                                TREATMENT DATE:  09/08/23 Findings from evaluation discussed, pt educated on plan of care, HEP initiated.  NuStep: Level 4x 8 minutes- PT preset to discuss progress Pt declined ice/Game Ready.  Will do at home  PATIENT EDUCATION:  Education details: Access Code: [email or web address] Person educated: Patient Education method: Explanation,  Demonstration, and Handouts Education comprehension: verbalized understanding and returned demonstration  HOME EXERCISE PROGRAM: Access Code: [email or web address] URL: [email or web address] Date: 09/08/2023 Prepared by: Tresa Endo  Exercises - Seated Hamstring Stretch  - 3 x daily - 7 x weekly - 1 sets - 3 reps - 20  hold - Step Up  - 1-2 x daily - 7 x weekly - 1-2 sets - 10 reps - Gastroc Stretch on Wall  - 2 x daily - 7 x weekly - 1 sets - 3 reps - 20 hold - Supine Quad Set on Towel Roll  - 2 x daily - 7 x weekly - 2 sets - 10 reps - 5 hold - Seated Knee Flexion AAROM  - 3 x daily - 7 x weekly - 1 sets - 5 reps - 10 hold - Sit to Stand  - 2 x daily - 7 x weekly - 1-2 sets - 10 reps - Clamshell  - 2 x daily - 7 x weekly - 1-2 sets - 10 reps  ASSESSMENT:  CLINICAL IMPRESSION: Patient is a 68 y.o. female who was seen today for physical therapy evaluation and treatment for s/p TKA on the Rt performed 08/21/23. She had Lt hip replacement in June 2024. Pt had home health PT after surgery. Pt reports 0-4/10 pain and is using a cane for community ambulation as needed.  She has limited Rt knee ROM appropriate for post op and minimal Rt knee edema.  Gait is antalgic with shortened step length and reduced time spent in stance on the Rt LE.  Pt negotiates steps with step-to gait pattern and circumducts on the Rt with ascending.  Patient will benefit from skilled PT to address the below impairments and improve overall function.   OBJECTIVE IMPAIRMENTS: Abnormal gait, decreased activity tolerance, decreased balance, decreased mobility, difficulty walking, decreased ROM, decreased strength, increased edema, increased fascial restrictions, increased muscle spasms, impaired flexibility, and pain.   ACTIVITY LIMITATIONS: lifting, standing, squatting, stairs, transfers, bathing, dressing, and locomotion level  PARTICIPATION LIMITATIONS: meal prep, cleaning, laundry, driving, shopping, community activity, and  yard work  PERSONAL FACTORS: 1-2 comorbidities: Lt hip replacement, Rt knee replacement   are also affecting patient's functional outcome.   REHAB POTENTIAL: Good  CLINICAL DECISION MAKING: Stable/uncomplicated  EVALUATION COMPLEXITY: Moderate   GOALS: Goals reviewed with patient? Yes  SHORT TERM GOALS: Target date: 10/06/2023   Be independent in initial HEP Baseline: Goal status: INITIAL  2.  Improve Rt knee A/ROM flexion to > or = to 100 degrees to allow for sitting and steps without deviation Baseline: 85 P/ROM Goal status: INITIAL  3.  Demonstrate symmetry with ambulation on level surface Baseline:  Goal status: INITIAL  4.  Perform sit to stand without UE support due to improved knee and hip strength  Baseline:  Goal status: INITIAL   LONG TERM GOALS: Target date: 11/03/2023    Be independent in advanced HEP Baseline:  Goal status: INITIAL  2.  Improve LEFS to > or = to 60/80 to allow for return to prior level of function Baseline: 28/80 Goal status: INITIAL  3.  Improve strength and ROM to ascend and descend steps with step-over step pattern and use of 1 rail Baseline:  Goal status: INITIAL  4.  Demonstrate Rt knee A/ROM flexion to > or = to 120 degrees to allow for squatting for gardening Baseline: 85 Goal status: INITIAL  5.  Stand and walk in the community without limitation due to pain or fatigue  Baseline:  Goal status: INITIAL    PLAN:  PT FREQUENCY: 2x/week  PT DURATION: 8 weeks  PLANNED INTERVENTIONS: 97110-Therapeutic exercises, 97530- Therapeutic activity, [id number]- Neuromuscular re-education, 97535- Self Care, 44034- Manual therapy, [id number]- Gait training, [phone]- Canalith repositioning, [id number]- Aquatic Therapy, 97014- Electrical stimulation (unattended), [id number]- Electrical  stimulation (manual), 62130- Vasopneumatic device, [id number]- Ultrasound, [id number]- Ionotophoresis 4mg /ml Dexamethasone, Patient/Family education, Balance training, Taping, Dry  Needling, Joint mobilization, Joint manipulation, Vestibular training, Cryotherapy, and Moist heat  PLAN FOR NEXT SESSION: edema management, gait, ROM, functional strength and mobility    Lorrene Reid, PT 09/08/23 11:27 AM   [facility] [address] [place], [place] [address] Phone # [phone] Fax [phone]

## 2023-09-08 ENCOUNTER — Other Ambulatory Visit: Payer: Self-pay

## 2023-09-08 ENCOUNTER — Ambulatory Visit: Payer: Medicare Other | Attending: Orthopaedic Surgery

## 2023-09-08 DIAGNOSIS — R262 Difficulty in walking, not elsewhere classified: Secondary | ICD-10-CM | POA: Insufficient documentation

## 2023-09-08 DIAGNOSIS — Z471 Aftercare following joint replacement surgery: Secondary | ICD-10-CM | POA: Insufficient documentation

## 2023-09-08 DIAGNOSIS — Z96651 Presence of right artificial knee joint: Secondary | ICD-10-CM | POA: Diagnosis not present

## 2023-09-08 DIAGNOSIS — R2689 Other abnormalities of gait and mobility: Secondary | ICD-10-CM | POA: Insufficient documentation

## 2023-09-08 DIAGNOSIS — M25561 Pain in right knee: Secondary | ICD-10-CM | POA: Insufficient documentation

## 2023-09-08 DIAGNOSIS — R600 Localized edema: Secondary | ICD-10-CM | POA: Insufficient documentation

## 2023-09-08 DIAGNOSIS — M25661 Stiffness of right knee, not elsewhere classified: Secondary | ICD-10-CM | POA: Diagnosis not present

## 2023-09-08 DIAGNOSIS — M6281 Muscle weakness (generalized): Secondary | ICD-10-CM | POA: Insufficient documentation

## 2023-09-11 ENCOUNTER — Ambulatory Visit: Payer: Medicare Other | Admitting: Physical Therapy

## 2023-09-11 ENCOUNTER — Encounter: Payer: Self-pay | Admitting: Physical Therapy

## 2023-09-11 DIAGNOSIS — R2689 Other abnormalities of gait and mobility: Secondary | ICD-10-CM | POA: Diagnosis not present

## 2023-09-11 DIAGNOSIS — R600 Localized edema: Secondary | ICD-10-CM

## 2023-09-11 DIAGNOSIS — M25661 Stiffness of right knee, not elsewhere classified: Secondary | ICD-10-CM

## 2023-09-11 DIAGNOSIS — Z96651 Presence of right artificial knee joint: Secondary | ICD-10-CM | POA: Diagnosis not present

## 2023-09-11 DIAGNOSIS — M6281 Muscle weakness (generalized): Secondary | ICD-10-CM | POA: Diagnosis not present

## 2023-09-11 DIAGNOSIS — M25561 Pain in right knee: Secondary | ICD-10-CM | POA: Diagnosis not present

## 2023-09-11 DIAGNOSIS — Z471 Aftercare following joint replacement surgery: Secondary | ICD-10-CM | POA: Diagnosis not present

## 2023-09-11 DIAGNOSIS — R262 Difficulty in walking, not elsewhere classified: Secondary | ICD-10-CM

## 2023-09-11 NOTE — Therapy (Signed)
OUTPATIENT PHYSICAL THERAPY LOWER EXTREMITY TREATMENT   Patient Name: Susan Woodward MRN: [id number] DOB:1956-06-28, 68 y.o., female Today's Date: 09/11/2023  END OF SESSION:  PT End of Session - 09/11/23 0854     Visit Number 2    Date for PT Re-Evaluation 11/03/23    Authorization Type BCBS Medicare    Progress Note Due on Visit 10    PT Start Time 0850    PT Stop Time 0930    PT Time Calculation (min) 40 min    Activity Tolerance Patient tolerated treatment well    Behavior During Therapy [facility] for tasks assessed/performed              Past Medical History:  Diagnosis Date   Arthritis    GERD (gastroesophageal reflux disease)    Hypertension    Osteoporosis    PONV (postoperative nausea and vomiting)    Past Surgical History:  Procedure Laterality Date   BACK SURGERY  08/16/2020   L4-5 fusion by Dr. Lelon Perla   TOTAL HIP ARTHROPLASTY Left 01/02/2023   Procedure: LEFT TOTAL HIP ARTHROPLASTY ANTERIOR APPROACH;  Surgeon: Kathryne Hitch, MD;  Location: [facility];  Service: Orthopedics;  Laterality: Left;   TOTAL KNEE ARTHROPLASTY Right 08/21/2023   Procedure: RIGHT TOTAL KNEE ARTHROPLASTY;  Surgeon: Kathryne Hitch, MD;  Location: [facility];  Service: Orthopedics;  Laterality: Right;   WRIST SURGERY Right    12 years ago   Patient Active Problem List   Diagnosis Date Noted   Status post right knee replacement 08/21/2023   Status post total replacement of left hip 01/02/2023   Degenerative spondylolisthesis 08/26/2021   Spinal stenosis of lumbar region 07/13/2017    PCP: Soundra Pilon, FNP  REFERRING PROVIDER: Doneen Poisson, MD  REFERRING DIAG:  Diagnosis  [phone] (ICD-10-CM) - Status post right knee replacement    THERAPY DIAG:  Difficulty in walking, not elsewhere classified  Muscle weakness (generalized)  Other abnormalities of gait and mobility  Acute pain of right knee  Localized edema  Stiffness of right knee, not  elsewhere classified  Rationale for Evaluation and Treatment: Rehabilitation  ONSET DATE: surgery for TKA on 08/21/23, chronic OA   SUBJECTIVE:   SUBJECTIVE STATEMENT: Pt is 3 weeks s/p Rt TKA.  I have had more swelling with the increased activity.  I used the Game Ready at home 3x yesterday.  I can't seem to get comfortable for sleeping.      From MD visit on 09/03/23:  The patient is here today for first postoperative follow-up visit status post a right total knee replacement.  She is someone who does not tolerate pain medications at all so she is not really taking any.  She has been having home health therapy.   Her right calf is soft.  Her right knee has almost full extension and we can flex her to about 70 degrees.  Incision looks good.  Staples were removed and Steri-Strips applied.      PERTINENT HISTORY: GERD, HTN, osteoporosis, L4-5 fusion 08/2020, Lt Total hip replacement 01/02/23  PAIN: 09/08/23 Are you having pain? Yes: NPRS scale: 0-5/10 Pain location: Rt knee  Pain description: dull ache Aggravating factors: exercise, movement, walking for long periods  Relieving factors: ice, Tylenol   PRECAUTIONS: None  RED FLAGS: None   WEIGHT BEARING RESTRICTIONS: No  FALLS:  Has patient fallen in last 6 months? No  LIVING ENVIRONMENT: Lives with: lives with their spouse Lives in: House/apartment Stairs: Yes:  External: 3 steps; none Has following equipment at home: Single point cane and Walker - 2 wheeled  OCCUPATION: retired   PLOF: Independent, Vocation/Vocational requirements: retired , and Leisure: yardwork, walking for exercise   PATIENT GOALS: return to prior level of function, reduce pain  NEXT MD VISIT: 10/01/23  OBJECTIVE:  Note: Objective measures were completed at Evaluation unless otherwise noted.  DIAGNOSTIC FINDINGS: NA  PATIENT SURVEYS:  09/08/23: LEFS 28/80=35%  COGNITION: Overall cognitive status: Within functional limits for tasks  assessed     SENSATION: WFL  EDEMA:  Minimal post-op edema   MUSCLE LENGTH: Limited hamstring length due to edema and post-op  POSTURE: No Significant postural limitations  PALPATION: Good patellar mobility on the Rt -medial/lateral.  Reduced mobility with superior/inferior.  Mild edema about the Rt knee joint.  Tension and tenderness over distal hamstring insertions.  Well healing surgical incision.    LOWER EXTREMITY ROM:  Passive ROM Right eval Right 2/14 Left eval  Hip flexion Bil hip flexibility is WFLs  Full  Hip extension     Hip abduction     Hip adduction     Hip internal rotation     Hip external rotation     Knee flexion 85 95   Knee extension 8    Ankle dorsiflexion     Ankle plantarflexion     Ankle inversion     Ankle eversion      (Blank rows = not tested)  LOWER EXTREMITY MMT:  MMT Right eval Left eval  Hip flexion 4 4+  Hip extension 4+ 4+  Hip abduction 4 4+  Hip adduction    Hip internal rotation 4+ 4+  Hip external rotation 4+ 4+  Knee flexion 4 4+  Knee extension 4+ 4+  Ankle dorsiflexion    Ankle plantarflexion    Ankle inversion    Ankle eversion     (Blank rows = not tested)  GAIT: Distance walked: 50 Assistive device utilized:  none Level of assistance: Modified independence Comments: reduced time on Rt with reduced step length on Rt.  Circumduction of Rt LE with stepping up on step with Rt                                                                                                                                TREATMENT DATE:  09/11/23 NuStep L4 x 6' PT present to discuss status In parallel bars - Rt knee high march, hip ext and abd x10 each Seated Rt knee flexion foot on slider x20, measures 95 deg today Seated HS stretch 2x30" Seated lumbar hang stretch 2x15" Sit to stand hands on thighs from mat table x10 Supine quad set with towel under heel for knee extension emphasis Supine bridge x10 Supine clam green tied  band x10 Stair flight 1x - Pt using step to pattern and unable to lead with Rt LE without hip abduction compenstation Step ups with bil rail  Rt LE 4" step with focus on straight plane march x10 Standing heel/toe raises x20 with rail Manual therapy: Rt knee: retrograde massage, scar mobs working around [employer], patellar mobs all planes  09/08/23 Findings from evaluation discussed, pt educated on plan of care, HEP initiated.  NuStep: Level 4x 8 minutes- PT preset to discuss progress Pt declined ice/Game Ready.  Will do at home  PATIENT EDUCATION:  Education details: Access Code: [email or web address] Person educated: Patient Education method: Explanation, Demonstration, and Handouts Education comprehension: verbalized understanding and returned demonstration  HOME EXERCISE PROGRAM: Access Code: [email or web address] URL: [email or web address] Date: 09/11/2023 Prepared by: Loistine Simas Keanu Lesniak  Exercises - Seated Hamstring Stretch  - 3 x daily - 7 x weekly - 1 sets - 3 reps - 20 hold - Step Up  - 1-2 x daily - 7 x weekly - 1-2 sets - 10 reps - Gastroc Stretch on Wall  - 2 x daily - 7 x weekly - 1 sets - 3 reps - 20 hold - Supine Quad Set on Towel Roll  - 2 x daily - 7 x weekly - 2 sets - 10 reps - 5 hold - Seated Knee Flexion AAROM  - 3 x daily - 7 x weekly - 1 sets - 5 reps - 10 hold - Sit to Stand  - 2 x daily - 7 x weekly - 1-2 sets - 10 reps - Hooklying Clamshell with Resistance  - 1 x daily - 7 x weekly - 1-2 sets - 10 reps  ASSESSMENT:  CLINICAL IMPRESSION: Pt continues to ambulate without AD and with flexed knee.  She is working on extending her stride length.  She actually demos very good edema control.  Her knee measures -8 to 95 deg today demo'ing improvement in knee flexion by 10 deg since last visit.  PT instructed her in various ways to work with gravity and activation of quad to improve knee ext.   Pt reports she is using step to pattern on stairs.  She is unable to step up onto 6"  step without using Rt hip circumduction compensation so used 4" today for step ups.   Pt was having a lot of pain with SL clam so revised HEP and gave green clam band for supine as this was better tolerated today.   Pt has game ready machine at home and has been doing 3x/day and declined doing in clinic today.  OBJECTIVE IMPAIRMENTS: Abnormal gait, decreased activity tolerance, decreased balance, decreased mobility, difficulty walking, decreased ROM, decreased strength, increased edema, increased fascial restrictions, increased muscle spasms, impaired flexibility, and pain.   ACTIVITY LIMITATIONS: lifting, standing, squatting, stairs, transfers, bathing, dressing, and locomotion level  PARTICIPATION LIMITATIONS: meal prep, cleaning, laundry, driving, shopping, community activity, and yard work  PERSONAL FACTORS: 1-2 comorbidities: Lt hip replacement, Rt knee replacement   are also affecting patient's functional outcome.   REHAB POTENTIAL: Good  CLINICAL DECISION MAKING: Stable/uncomplicated  EVALUATION COMPLEXITY: Moderate   GOALS: Goals reviewed with patient? Yes  SHORT TERM GOALS: Target date: 10/06/2023   Be independent in initial HEP Baseline: Goal status: INITIAL  2.  Improve Rt knee A/ROM flexion to > or = to 100 degrees to allow for sitting and steps without deviation Baseline: 85 P/ROM Goal status: INITIAL  3.  Demonstrate symmetry with ambulation on level surface Baseline:  Goal status: INITIAL  4.  Perform sit to stand without UE support due to improved knee and hip strength  Baseline:  Goal status: INITIAL  LONG TERM GOALS: Target date: 11/03/2023    Be independent in advanced HEP Baseline:  Goal status: INITIAL  2.  Improve LEFS to > or = to 60/80 to allow for return to prior level of function Baseline: 28/80 Goal status: INITIAL  3.  Improve strength and ROM to ascend and descend steps with step-over step pattern and use of 1 rail Baseline:  Goal  status: INITIAL  4.  Demonstrate Rt knee A/ROM flexion to > or = to 120 degrees to allow for squatting for gardening Baseline: 85 Goal status: INITIAL  5.  Stand and walk in the community without limitation due to pain or fatigue  Baseline:  Goal status: INITIAL    PLAN:  PT FREQUENCY: 2x/week  PT DURATION: 8 weeks  PLANNED INTERVENTIONS: 97110-Therapeutic exercises, 97530- Therapeutic activity, 97112- Neuromuscular re-education, 97535- Self Care, 16109- Manual therapy, [id number]- Gait training, [phone]- Canalith repositioning, [id number]- Aquatic Therapy, 97014- Electrical stimulation (unattended), [id number]- Electrical stimulation (manual), 97016- Vasopneumatic device, [id number]- Ultrasound, [id number]- Ionotophoresis 4mg /ml Dexamethasone, Patient/Family education, Balance training, Taping, Dry Needling, Joint mobilization, Joint manipulation, Vestibular training, Cryotherapy, and Moist heat  PLAN FOR NEXT SESSION: edema management, gait, ROM, functional strength and mobility    Morton Peters, PT 09/11/23 12:10 PM   [facility] [address], [address] [place], [place] [address] Phone # [phone] Fax [phone]

## 2023-09-14 NOTE — Therapy (Signed)
OUTPATIENT PHYSICAL THERAPY LOWER EXTREMITY TREATMENT   Patient Name: Susan Woodward MRN: [id number] DOB:06/16/1956, 68 y.o., female Today's Date: 09/14/2023  END OF SESSION:     Past Medical History:  Diagnosis Date   Arthritis    GERD (gastroesophageal reflux disease)    Hypertension    Osteoporosis    PONV (postoperative nausea and vomiting)    Past Surgical History:  Procedure Laterality Date   BACK SURGERY  08/16/2020   L4-5 fusion by Dr. Lelon Perla   TOTAL HIP ARTHROPLASTY Left 01/02/2023   Procedure: LEFT TOTAL HIP ARTHROPLASTY ANTERIOR APPROACH;  Surgeon: Kathryne Hitch, MD;  Location: [facility];  Service: Orthopedics;  Laterality: Left;   TOTAL KNEE ARTHROPLASTY Right 08/21/2023   Procedure: RIGHT TOTAL KNEE ARTHROPLASTY;  Surgeon: Kathryne Hitch, MD;  Location: [facility];  Service: Orthopedics;  Laterality: Right;   WRIST SURGERY Right    12 years ago   Patient Active Problem List   Diagnosis Date Noted   Status post right knee replacement 08/21/2023   Status post total replacement of left hip 01/02/2023   Degenerative spondylolisthesis 08/26/2021   Spinal stenosis of lumbar region 07/13/2017    PCP: Soundra Pilon, FNP  REFERRING PROVIDER: Doneen Poisson, MD  REFERRING DIAG:  Diagnosis  [phone] (ICD-10-CM) - Status post right knee replacement    THERAPY DIAG:  No diagnosis found.  Rationale for Evaluation and Treatment: Rehabilitation  ONSET DATE: surgery for TKA on 08/21/23, chronic OA   SUBJECTIVE:   SUBJECTIVE STATEMENT: No pain.   The biggest thing is sleep. I'm only getting about 5 hours of sleep.     From MD visit on 09/03/23:  The patient is here today for first postoperative follow-up visit status post a right total knee replacement.  She is someone who does not tolerate pain medications at all so she is not really taking any.  She has been having home health therapy.   Her right calf is soft.  Her right knee has almost  full extension and we can flex her to about 70 degrees.  Incision looks good.  Staples were removed and Steri-Strips applied.      PERTINENT HISTORY: GERD, HTN, osteoporosis, L4-5 fusion 08/2020, Lt Total hip replacement 01/02/23  PAIN: 09/08/23 Are you having pain? Yes: NPRS scale: 0-5/10 Pain location: Rt knee  Pain description: dull ache Aggravating factors: exercise, movement, walking for long periods  Relieving factors: ice, Tylenol   PRECAUTIONS: None  RED FLAGS: None   WEIGHT BEARING RESTRICTIONS: No  FALLS:  Has patient fallen in last 6 months? No  LIVING ENVIRONMENT: Lives with: lives with their spouse Lives in: House/apartment Stairs: Yes: External: 3 steps; none Has following equipment at home: Single point cane and Walker - 2 wheeled  OCCUPATION: retired   PLOF: Independent, Vocation/Vocational requirements: retired , and Leisure: yardwork, walking for exercise   PATIENT GOALS: return to prior level of function, reduce pain  NEXT MD VISIT: 10/01/23  OBJECTIVE:  Note: Objective measures were completed at Evaluation unless otherwise noted.  DIAGNOSTIC FINDINGS: NA  PATIENT SURVEYS:  09/08/23: LEFS 28/80=35%  COGNITION: Overall cognitive status: Within functional limits for tasks assessed     SENSATION: WFL  EDEMA:  Minimal post-op edema   MUSCLE LENGTH: Limited hamstring length due to edema and post-op  POSTURE: No Significant postural limitations  PALPATION: Good patellar mobility on the Rt -medial/lateral.  Reduced mobility with superior/inferior.  Mild edema about the Rt knee joint.  Tension and  tenderness over distal hamstring insertions.  Well healing surgical incision.    LOWER EXTREMITY ROM:  Passive ROM Right eval Right 2/14 Left eval  Hip flexion Bil hip flexibility is WFLs  Full  Hip extension     Hip abduction     Hip adduction     Hip internal rotation     Hip external rotation     Knee flexion 85 95   Knee extension 8 2 deg   09/15/23   Ankle dorsiflexion     Ankle plantarflexion     Ankle inversion     Ankle eversion      (Blank rows = not tested)  LOWER EXTREMITY MMT:  MMT Right eval Left eval  Hip flexion 4 4+  Hip extension 4+ 4+  Hip abduction 4 4+  Hip adduction    Hip internal rotation 4+ 4+  Hip external rotation 4+ 4+  Knee flexion 4 4+  Knee extension 4+ 4+  Ankle dorsiflexion    Ankle plantarflexion    Ankle inversion    Ankle eversion     (Blank rows = not tested)  GAIT: Distance walked: 50 Assistive device utilized:  none Level of assistance: Modified independence Comments: reduced time on Rt with reduced step length on Rt.  Circumduction of Rt LE with stepping up on step with Rt                                                                                                                                TREATMENT DATE:  09/15/23 NuStep L5 x 6' PT present to discuss status Seated Rt knee flexion foot on slider x20, measures 95 deg today Seated HS stretch 2x30" and with active quad set x 10 Seated lumbar hang stretch 2x15" Sit to stand hands on thighs from mat table x10 TKE green band x 10 standing Supine bridge x10 Supine clam green tied band x10 S/L clam x 10 no band due to pain in knee Stair flight 1x - Pt using reciprocal pattern  Step ups with bil rail Rt LE 4" and 6"  step with focus on straight plane march x10 4 and 6 inch inch step down with L LE x 10 Standing heel raises x20 with rail Standing hip ABD and mule kick x 10 B Prone knee flex x 15 Prone knee flex stretch with strap x 3 SLR x 15 SLS x 15 sec on R   09/11/23 NuStep L4 x 6' PT present to discuss status In parallel bars - Rt knee high march, hip ext and abd x10 each Seated Rt knee flexion foot on slider x20, measures 95 deg today Seated HS stretch 2x30" Seated lumbar hang stretch 2x15" Sit to stand hands on thighs from mat table x10 Supine quad set with towel under heel for knee extension  emphasis Supine bridge x10 Supine clam green tied band x10 Stair flight 1x - Pt using step to pattern and  unable to lead with Rt LE without hip abduction compenstation Step ups with bil rail Rt LE 4" step with focus on straight plane march x10 Standing heel/toe raises x20 with rail Manual therapy: Rt knee: retrograde massage, scar mobs working around [employer], patellar mobs all planes  09/08/23 Findings from evaluation discussed, pt educated on plan of care, HEP initiated.  NuStep: Level 4x 8 minutes- PT preset to discuss progress Pt declined ice/Game Ready.  Will do at home  PATIENT EDUCATION:  Education details: Access Code: [email or web address] Person educated: Patient Education method: Explanation, Demonstration, and Handouts Education comprehension: verbalized understanding and returned demonstration  HOME EXERCISE PROGRAM: Access Code: [email or web address] URL: [email or web address] Date: 09/11/2023 Prepared by: Loistine Simas Beuhring  Exercises - Seated Hamstring Stretch  - 3 x daily - 7 x weekly - 1 sets - 3 reps - 20 hold - Step Up  - 1-2 x daily - 7 x weekly - 1-2 sets - 10 reps - Gastroc Stretch on Wall  - 2 x daily - 7 x weekly - 1 sets - 3 reps - 20 hold - Supine Quad Set on Towel Roll  - 2 x daily - 7 x weekly - 2 sets - 10 reps - 5 hold - Seated Knee Flexion AAROM  - 3 x daily - 7 x weekly - 1 sets - 5 reps - 10 hold - Sit to Stand  - 2 x daily - 7 x weekly - 1-2 sets - 10 reps - Hooklying Clamshell with Resistance  - 1 x daily - 7 x weekly - 1-2 sets - 10 reps  ASSESSMENT:  CLINICAL IMPRESSION: Patient was able to do steps reciprocally today with B UE support. She demonstrated improved extension to 2 deg and passively to 0. R knee flexion 95 deg and 100 deg passively. Her main complaint is not being able to sleep. Good quad contraction with TKE and SLR. Added in prone knee flexion for HS strengthening without difficulty.  OBJECTIVE IMPAIRMENTS: Abnormal gait, decreased  activity tolerance, decreased balance, decreased mobility, difficulty walking, decreased ROM, decreased strength, increased edema, increased fascial restrictions, increased muscle spasms, impaired flexibility, and pain.   ACTIVITY LIMITATIONS: lifting, standing, squatting, stairs, transfers, bathing, dressing, and locomotion level  PARTICIPATION LIMITATIONS: meal prep, cleaning, laundry, driving, shopping, community activity, and yard work  PERSONAL FACTORS: 1-2 comorbidities: Lt hip replacement, Rt knee replacement   are also affecting patient's functional outcome.   REHAB POTENTIAL: Good  CLINICAL DECISION MAKING: Stable/uncomplicated  EVALUATION COMPLEXITY: Moderate   GOALS: Goals reviewed with patient? Yes  SHORT TERM GOALS: Target date: 10/06/2023   Be independent in initial HEP Baseline: Goal status: INITIAL  2.  Improve Rt knee A/ROM flexion to > or = to 100 degrees to allow for sitting and steps without deviation Baseline: 85 P/ROM Goal status: INITIAL  3.  Demonstrate symmetry with ambulation on level surface Baseline:  Goal status: INITIAL  4.  Perform sit to stand without UE support due to improved knee and hip strength  Baseline:  Goal status: INITIAL   LONG TERM GOALS: Target date: 11/03/2023    Be independent in advanced HEP Baseline:  Goal status: INITIAL  2.  Improve LEFS to > or = to 60/80 to allow for return to prior level of function Baseline: 28/80 Goal status: INITIAL  3.  Improve strength and ROM to ascend and descend steps with step-over step pattern and use of 1 rail Baseline:  Goal status: INITIAL  4.  Demonstrate Rt knee  A/ROM flexion to > or = to 120 degrees to allow for squatting for gardening Baseline: 85 Goal status: INITIAL  5.  Stand and walk in the community without limitation due to pain or fatigue  Baseline:  Goal status: INITIAL    PLAN:  PT FREQUENCY: 2x/week  PT DURATION: 8 weeks  PLANNED INTERVENTIONS:  97110-Therapeutic exercises, 97530- Therapeutic activity, [id number]- Neuromuscular re-education, 97535- Self Care, 16109- Manual therapy, [id number]- Gait training, [phone]- Canalith repositioning, [id number]- Aquatic Therapy, 97014- Electrical stimulation (unattended), [id number]- Electrical stimulation (manual), 97016- Vasopneumatic device, [id number]- Ultrasound, [id number]- Ionotophoresis 4mg /ml Dexamethasone, Patient/Family education, Balance training, Taping, Dry Needling, Joint mobilization, Joint manipulation, Vestibular training, Cryotherapy, and Moist heat  PLAN FOR NEXT SESSION: edema management, gait, ROM, functional strength and mobility    Solon Palm, PT  09/14/23 6:13 PM   [facility] [address] [place], [place] [address] Phone # [phone] Fax [phone]

## 2023-09-15 ENCOUNTER — Encounter: Payer: Self-pay | Admitting: Physical Therapy

## 2023-09-15 ENCOUNTER — Ambulatory Visit: Payer: Medicare Other | Admitting: Physical Therapy

## 2023-09-15 DIAGNOSIS — M25561 Pain in right knee: Secondary | ICD-10-CM

## 2023-09-15 DIAGNOSIS — M25661 Stiffness of right knee, not elsewhere classified: Secondary | ICD-10-CM | POA: Diagnosis not present

## 2023-09-15 DIAGNOSIS — R2689 Other abnormalities of gait and mobility: Secondary | ICD-10-CM

## 2023-09-15 DIAGNOSIS — M6281 Muscle weakness (generalized): Secondary | ICD-10-CM | POA: Diagnosis not present

## 2023-09-15 DIAGNOSIS — R262 Difficulty in walking, not elsewhere classified: Secondary | ICD-10-CM

## 2023-09-15 DIAGNOSIS — M5459 Other low back pain: Secondary | ICD-10-CM

## 2023-09-15 DIAGNOSIS — R600 Localized edema: Secondary | ICD-10-CM | POA: Diagnosis not present

## 2023-09-15 DIAGNOSIS — Z96651 Presence of right artificial knee joint: Secondary | ICD-10-CM | POA: Diagnosis not present

## 2023-09-15 DIAGNOSIS — Z471 Aftercare following joint replacement surgery: Secondary | ICD-10-CM | POA: Diagnosis not present

## 2023-09-15 DIAGNOSIS — R252 Cramp and spasm: Secondary | ICD-10-CM

## 2023-09-17 ENCOUNTER — Encounter: Payer: Medicare Other | Admitting: Physical Therapy

## 2023-09-21 ENCOUNTER — Encounter: Payer: Self-pay | Admitting: Rehabilitative and Restorative Service Providers"

## 2023-09-21 ENCOUNTER — Ambulatory Visit: Payer: Medicare Other | Admitting: Rehabilitative and Restorative Service Providers"

## 2023-09-21 DIAGNOSIS — R2689 Other abnormalities of gait and mobility: Secondary | ICD-10-CM

## 2023-09-21 DIAGNOSIS — M25561 Pain in right knee: Secondary | ICD-10-CM

## 2023-09-21 DIAGNOSIS — M25661 Stiffness of right knee, not elsewhere classified: Secondary | ICD-10-CM | POA: Diagnosis not present

## 2023-09-21 DIAGNOSIS — M6281 Muscle weakness (generalized): Secondary | ICD-10-CM

## 2023-09-21 DIAGNOSIS — R600 Localized edema: Secondary | ICD-10-CM

## 2023-09-21 DIAGNOSIS — R262 Difficulty in walking, not elsewhere classified: Secondary | ICD-10-CM | POA: Diagnosis not present

## 2023-09-21 DIAGNOSIS — Z96651 Presence of right artificial knee joint: Secondary | ICD-10-CM | POA: Diagnosis not present

## 2023-09-21 DIAGNOSIS — Z471 Aftercare following joint replacement surgery: Secondary | ICD-10-CM | POA: Diagnosis not present

## 2023-09-21 NOTE — Therapy (Signed)
 OUTPATIENT PHYSICAL THERAPY LOWER EXTREMITY TREATMENT   Patient Name: Susan Woodward MRN: [id number] DOB:Oct 29, 1955, 68 y.o., female Today's Date: 09/21/2023  END OF SESSION:  PT End of Session - 09/21/23 0931     Visit Number 4    Date for PT Re-Evaluation 11/03/23    Authorization Type BCBS Medicare    Progress Note Due on Visit 10    PT Start Time 0928    PT Stop Time 1010    PT Time Calculation (min) 42 min    Activity Tolerance Patient tolerated treatment well    Behavior During Therapy [facility] for tasks assessed/performed               Past Medical History:  Diagnosis Date   Arthritis    GERD (gastroesophageal reflux disease)    Hypertension    Osteoporosis    PONV (postoperative nausea and vomiting)    Past Surgical History:  Procedure Laterality Date   BACK SURGERY  08/16/2020   L4-5 fusion by Dr. Lelon Perla   TOTAL HIP ARTHROPLASTY Left 01/02/2023   Procedure: LEFT TOTAL HIP ARTHROPLASTY ANTERIOR APPROACH;  Surgeon: Kathryne Hitch, MD;  Location: [facility];  Service: Orthopedics;  Laterality: Left;   TOTAL KNEE ARTHROPLASTY Right 08/21/2023   Procedure: RIGHT TOTAL KNEE ARTHROPLASTY;  Surgeon: Kathryne Hitch, MD;  Location: [facility];  Service: Orthopedics;  Laterality: Right;   WRIST SURGERY Right    12 years ago   Patient Active Problem List   Diagnosis Date Noted   Status post right knee replacement 08/21/2023   Status post total replacement of left hip 01/02/2023   Degenerative spondylolisthesis 08/26/2021   Spinal stenosis of lumbar region 07/13/2017    PCP: Soundra Pilon, FNP  REFERRING PROVIDER: Doneen Poisson, MD  REFERRING DIAG:  Diagnosis  [phone] (ICD-10-CM) - Status post right knee replacement    THERAPY DIAG:  Difficulty in walking, not elsewhere classified  Muscle weakness (generalized)  Other abnormalities of gait and mobility  Acute pain of right knee  Localized edema  Stiffness of right knee, not  elsewhere classified  Rationale for Evaluation and Treatment: Rehabilitation  ONSET DATE: surgery for TKA on 08/21/23, chronic OA   SUBJECTIVE:   SUBJECTIVE STATEMENT: Patient continues to deny pain, states that sleep is "a little bit better" and she is getting closer to 6 hours.    From MD visit on 09/03/23:  The patient is here today for first postoperative follow-up visit status post a right total knee replacement.  She is someone who does not tolerate pain medications at all so she is not really taking any.  She has been having home health therapy.   Her right calf is soft.  Her right knee has almost full extension and we can flex her to about 70 degrees.  Incision looks good.  Staples were removed and Steri-Strips applied.      PERTINENT HISTORY: GERD, HTN, osteoporosis, L4-5 fusion 08/2020, Lt Total hip replacement 01/02/23  PAIN:  Are you having pain? Yes: NPRS scale: 0/10 Pain location: Rt knee  Pain description: dull ache Aggravating factors: exercise, movement, walking for long periods  Relieving factors: ice, Tylenol   PRECAUTIONS: None  RED FLAGS: None   WEIGHT BEARING RESTRICTIONS: No  FALLS:  Has patient fallen in last 6 months? No  LIVING ENVIRONMENT: Lives with: lives with their spouse Lives in: House/apartment Stairs: Yes: External: 3 steps; none Has following equipment at home: Single point cane and Walker - 2  wheeled  OCCUPATION: retired   PLOF: Independent, Vocation/Vocational requirements: retired , and Leisure: yardwork, walking for exercise   PATIENT GOALS: return to prior level of function, reduce pain  NEXT MD VISIT: 10/01/23  OBJECTIVE:  Note: Objective measures were completed at Evaluation unless otherwise noted.  DIAGNOSTIC FINDINGS: NA  PATIENT SURVEYS:  09/08/23: LEFS 28/80=35%  COGNITION: Overall cognitive status: Within functional limits for tasks assessed     SENSATION: WFL  EDEMA:  Minimal post-op edema   MUSCLE  LENGTH: Limited hamstring length due to edema and post-op  POSTURE: No Significant postural limitations  PALPATION: Good patellar mobility on the Rt -medial/lateral.  Reduced mobility with superior/inferior.  Mild edema about the Rt knee joint.  Tension and tenderness over distal hamstring insertions.  Well healing surgical incision.    LOWER EXTREMITY ROM:  Passive ROM Right eval Right 2/14 Right 09/21/23 In sitting Left eval  Hip flexion Bil hip flexibility is WFLs   Full  Hip extension      Hip abduction      Hip adduction      Hip internal rotation      Hip external rotation      Knee flexion 85 95 110   Knee extension 8 2 deg  09/15/23 18   Ankle dorsiflexion      Ankle plantarflexion      Ankle inversion      Ankle eversion       (Blank rows = not tested)  LOWER EXTREMITY MMT:  MMT Right eval Left eval  Hip flexion 4 4+  Hip extension 4+ 4+  Hip abduction 4 4+  Hip adduction    Hip internal rotation 4+ 4+  Hip external rotation 4+ 4+  Knee flexion 4 4+  Knee extension 4+ 4+  Ankle dorsiflexion    Ankle plantarflexion    Ankle inversion    Ankle eversion     (Blank rows = not tested)  GAIT: Distance walked: 50 Assistive device utilized:  none Level of assistance: Modified independence Comments: reduced time on Rt with reduced step length on Rt.  Circumduction of Rt LE with stepping up on step with Rt                                                                                                                                TREATMENT DATE:  09/21/2023: NuStep L5 x 6' PT present to discuss status Seated Rt knee flexion foot on slider x20 (with cuing to slow down and do more purposeful motion) Seated bilateral hamstring stretch 2x20 sec Sit to/from stand x10 without UE use Standing terminal knee extension with green tband 2x10 right Supine quad set with small foam noodle under knee 2x10 right  Supine quad set with small foam noodle under ankle 2x10  right (with right knee extension missing 15 degrees from full extension) Supine right straight leg raise with 2# ankle weight 2x10 Left sidelying hip abduction  with 2# ankle weight 2x10 Prone right hamstring curl 2x10 Prone hip extension x15 bilat Hooklying clamshells with green tband 2x10 Hooklying marching with green tband around knees 2x10 FWD step ups on 6" step with UE support x10 bilat Step downs on 6" step with UE support x10 left, x5 right   09/15/23 NuStep L5 x 6' PT present to discuss status Seated Rt knee flexion foot on slider x20, measures 95 deg today Seated HS stretch 2x30" and with active quad set x 10 Seated lumbar hang stretch 2x15" Sit to stand hands on thighs from mat table x10 TKE green band x 10 standing Supine bridge x10 Supine clam green tied band x10 S/L clam x 10 no band due to pain in knee Stair flight 1x - Pt using reciprocal pattern  Step ups with bil rail Rt LE 4" and 6"  step with focus on straight plane march x10 4 and 6 inch inch step down with L LE x 10 Standing heel raises x20 with rail Standing hip ABD and mule kick x 10 B Prone knee flex x 15 Prone knee flex stretch with strap x 3 SLR x 15 SLS x 15 sec on R   09/11/23 NuStep L4 x 6' PT present to discuss status In parallel bars - Rt knee high march, hip ext and abd x10 each Seated Rt knee flexion foot on slider x20, measures 95 deg today Seated HS stretch 2x30" Seated lumbar hang stretch 2x15" Sit to stand hands on thighs from mat table x10 Supine quad set with towel under heel for knee extension emphasis Supine bridge x10 Supine clam green tied band x10 Stair flight 1x - Pt using step to pattern and unable to lead with Rt LE without hip abduction compenstation Step ups with bil rail Rt LE 4" step with focus on straight plane march x10 Standing heel/toe raises x20 with rail Manual therapy: Rt knee: retrograde massage, scar mobs working around [employer] strips, patellar mobs all  planes   PATIENT EDUCATION:  Education details: Access Code: [email or web address] Person educated: Patient Education method: Programmer, multimedia, Facilities manager, and Handouts Education comprehension: verbalized understanding and returned demonstration  HOME EXERCISE PROGRAM: Access Code: [email or web address] URL: [email or web address] Date: 09/11/2023 Prepared by: Loistine Simas Beuhring  Exercises - Seated Hamstring Stretch  - 3 x daily - 7 x weekly - 1 sets - 3 reps - 20 hold - Step Up  - 1-2 x daily - 7 x weekly - 1-2 sets - 10 reps - Gastroc Stretch on Wall  - 2 x daily - 7 x weekly - 1 sets - 3 reps - 20 hold - Supine Quad Set on Towel Roll  - 2 x daily - 7 x weekly - 2 sets - 10 reps - 5 hold - Seated Knee Flexion AAROM  - 3 x daily - 7 x weekly - 1 sets - 5 reps - 10 hold - Sit to Stand  - 2 x daily - 7 x weekly - 1-2 sets - 10 reps - Hooklying Clamshell with Resistance  - 1 x daily - 7 x weekly - 1-2 sets - 10 reps  ASSESSMENT:  CLINICAL IMPRESSION: Ms Mackins presents to skilled PT continuing to deny any pain.  Patient with greatly improved right knee flexion, however, she remains limited in her right knee extension. Performed various exercises today to encourage improved right knee extension.  Patient requires min cuing throughout for improved technique.  Patient continues to require skilled PT to progress towards goal related activities.  OBJECTIVE IMPAIRMENTS: Abnormal gait, decreased activity tolerance, decreased balance, decreased mobility, difficulty walking, decreased ROM, decreased strength, increased edema, increased fascial restrictions, increased muscle spasms, impaired flexibility, and pain.   ACTIVITY LIMITATIONS: lifting, standing, squatting, stairs, transfers, bathing, dressing, and locomotion level  PARTICIPATION LIMITATIONS: meal prep, cleaning, laundry, driving, shopping, community activity, and yard work  PERSONAL FACTORS: 1-2 comorbidities: Lt hip replacement, Rt knee  replacement   are also affecting patient's functional outcome.   REHAB POTENTIAL: Good  CLINICAL DECISION MAKING: Stable/uncomplicated  EVALUATION COMPLEXITY: Moderate   GOALS: Goals reviewed with patient? Yes  SHORT TERM GOALS: Target date: 10/06/2023   Be independent in initial HEP Baseline: Goal status: Met on 09/21/23  2.  Improve Rt knee A/ROM flexion to > or = to 100 degrees to allow for sitting and steps without deviation Baseline: 85 P/ROM Goal status: Ongoing (see above)  3.  Demonstrate symmetry with ambulation on level surface Baseline:  Goal status: Ongoing  4.  Perform sit to stand without UE support due to improved knee and hip strength  Baseline:  Goal status: Met on 09/21/23   LONG TERM GOALS: Target date: 11/03/2023    Be independent in advanced HEP Baseline:  Goal status: INITIAL  2.  Improve LEFS to > or = to 60/80 to allow for return to prior level of function Baseline: 28/80 Goal status: INITIAL  3.  Improve strength and ROM to ascend and descend steps with step-over step pattern and use of 1 rail Baseline:  Goal status: INITIAL  4.  Demonstrate Rt knee A/ROM flexion to > or = to 120 degrees to allow for squatting for gardening Baseline: 85 Goal status: INITIAL  5.  Stand and walk in the community without limitation due to pain or fatigue  Baseline:  Goal status: INITIAL    PLAN:  PT FREQUENCY: 2x/week  PT DURATION: 8 weeks  PLANNED INTERVENTIONS: 97110-Therapeutic exercises, 97530- Therapeutic activity, 97112- Neuromuscular re-education, 97535- Self Care, 62130- Manual therapy, [id number]- Gait training, [phone]- Canalith repositioning, [id number]- Aquatic Therapy, 97014- Electrical stimulation (unattended), [id number]- Electrical stimulation (manual), 97016- Vasopneumatic device, [id number]- Ultrasound, [id number]- Ionotophoresis 4mg /ml Dexamethasone, Patient/Family education, Balance training, Taping, Dry Needling, Joint mobilization, Joint manipulation,  Vestibular training, Cryotherapy, and Moist heat  PLAN FOR NEXT SESSION: edema management, gait, ROM, functional strength and mobility, progress quad strengthening and terminal knee extension   Reather Laurence, PT, DPT 09/21/23, 10:34 AM  [facility] [address], [address] [place], [place] [address] Phone # [phone] Fax [phone]

## 2023-09-22 DIAGNOSIS — I10 Essential (primary) hypertension: Secondary | ICD-10-CM | POA: Diagnosis not present

## 2023-09-22 DIAGNOSIS — M810 Age-related osteoporosis without current pathological fracture: Secondary | ICD-10-CM | POA: Diagnosis not present

## 2023-09-22 DIAGNOSIS — E7800 Pure hypercholesterolemia, unspecified: Secondary | ICD-10-CM | POA: Diagnosis not present

## 2023-09-24 ENCOUNTER — Ambulatory Visit: Payer: Medicare Other

## 2023-09-24 DIAGNOSIS — M25561 Pain in right knee: Secondary | ICD-10-CM | POA: Diagnosis not present

## 2023-09-24 DIAGNOSIS — R2689 Other abnormalities of gait and mobility: Secondary | ICD-10-CM | POA: Diagnosis not present

## 2023-09-24 DIAGNOSIS — Z471 Aftercare following joint replacement surgery: Secondary | ICD-10-CM | POA: Diagnosis not present

## 2023-09-24 DIAGNOSIS — Z96651 Presence of right artificial knee joint: Secondary | ICD-10-CM | POA: Diagnosis not present

## 2023-09-24 DIAGNOSIS — R262 Difficulty in walking, not elsewhere classified: Secondary | ICD-10-CM | POA: Diagnosis not present

## 2023-09-24 DIAGNOSIS — R600 Localized edema: Secondary | ICD-10-CM | POA: Diagnosis not present

## 2023-09-24 DIAGNOSIS — M6281 Muscle weakness (generalized): Secondary | ICD-10-CM

## 2023-09-24 DIAGNOSIS — R252 Cramp and spasm: Secondary | ICD-10-CM

## 2023-09-24 DIAGNOSIS — M25661 Stiffness of right knee, not elsewhere classified: Secondary | ICD-10-CM | POA: Diagnosis not present

## 2023-09-24 NOTE — Therapy (Signed)
 OUTPATIENT PHYSICAL THERAPY LOWER EXTREMITY TREATMENT   Patient Name: Susan Woodward MRN: [id number] DOB:1955-10-29, 68 y.o., female Today's Date: 09/24/2023  END OF SESSION:  PT End of Session - 09/24/23 1148     Visit Number 5    Date for PT Re-Evaluation 11/03/23    Authorization Type [employer] Medicare    Progress Note Due on Visit 10    PT Start Time 1148    PT Stop Time 1230    PT Time Calculation (min) 42 min    Activity Tolerance Patient tolerated treatment well    Behavior During Therapy WFL for tasks assessed/performed               Past Medical History:  Diagnosis Date   Arthritis    GERD (gastroesophageal reflux disease)    Hypertension    Osteoporosis    PONV (postoperative nausea and vomiting)    Past Surgical History:  Procedure Laterality Date   BACK SURGERY  08/16/2020   L4-5 fusion by Dr. Lelon Perla   TOTAL HIP ARTHROPLASTY Left 01/02/2023   Procedure: LEFT TOTAL HIP ARTHROPLASTY ANTERIOR APPROACH;  Surgeon: Kathryne Hitch, MD;  Location: [facility];  Service: Orthopedics;  Laterality: Left;   TOTAL KNEE ARTHROPLASTY Right 08/21/2023   Procedure: RIGHT TOTAL KNEE ARTHROPLASTY;  Surgeon: Kathryne Hitch, MD;  Location: [facility];  Service: Orthopedics;  Laterality: Right;   WRIST SURGERY Right    12 years ago   Patient Active Problem List   Diagnosis Date Noted   Status post right knee replacement 08/21/2023   Status post total replacement of left hip 01/02/2023   Degenerative spondylolisthesis 08/26/2021   Spinal stenosis of lumbar region 07/13/2017    PCP: Soundra Pilon, FNP  REFERRING PROVIDER: Doneen Poisson, MD  REFERRING DIAG:  Diagnosis  [phone] (ICD-10-CM) - Status post right knee replacement    THERAPY DIAG:  Difficulty in walking, not elsewhere classified  Muscle weakness (generalized)  Other abnormalities of gait and mobility  Acute pain of right knee  Cramp and spasm  Stiffness of right knee, not  elsewhere classified  Rationale for Evaluation and Treatment: Rehabilitation  ONSET DATE: surgery for TKA on 08/21/23, chronic OA   SUBJECTIVE:   SUBJECTIVE STATEMENT: Patient reports she is still getting about 6 hours of sleep.  Pain 0/10 at rest and 5/10 at worst.  "It's usually at night when I finally settle down and get still"    From MD visit on 09/03/23:  The patient is here today for first postoperative follow-up visit status post a right total knee replacement.  She is someone who does not tolerate pain medications at all so she is not really taking any.  She has been having home health therapy.   Her right calf is soft.  Her right knee has almost full extension and we can flex her to about 70 degrees.  Incision looks good.  Staples were removed and Steri-Strips applied.      PERTINENT HISTORY: GERD, HTN, osteoporosis, L4-5 fusion 08/2020, Lt Total hip replacement 01/02/23  PAIN:  Are you having pain? Yes: NPRS scale: 0/10 Pain location: Rt knee  Pain description: dull ache Aggravating factors: exercise, movement, walking for long periods  Relieving factors: ice, Tylenol   PRECAUTIONS: None  RED FLAGS: None   WEIGHT BEARING RESTRICTIONS: No  FALLS:  Has patient fallen in last 6 months? No  LIVING ENVIRONMENT: Lives with: lives with their spouse Lives in: House/apartment Stairs: Yes: External: 3 steps;  none Has following equipment at home: Single point cane and Walker - 2 wheeled  OCCUPATION: retired   PLOF: Independent, Vocation/Vocational requirements: retired , and Leisure: yardwork, walking for exercise   PATIENT GOALS: return to prior level of function, reduce pain  NEXT MD VISIT: 10/01/23  OBJECTIVE:  Note: Objective measures were completed at Evaluation unless otherwise noted.  DIAGNOSTIC FINDINGS: NA  PATIENT SURVEYS:  09/08/23: LEFS 28/80=35%  COGNITION: Overall cognitive status: Within functional limits for tasks  assessed     SENSATION: WFL  EDEMA:  Minimal post-op edema   MUSCLE LENGTH: Limited hamstring length due to edema and post-op  POSTURE: No Significant postural limitations  PALPATION: Good patellar mobility on the Rt -medial/lateral.  Reduced mobility with superior/inferior.  Mild edema about the Rt knee joint.  Tension and tenderness over distal hamstring insertions.  Well healing surgical incision.    LOWER EXTREMITY ROM:  Passive ROM Right eval Right 2/14 Right 09/21/23 In sitting Left eval  Hip flexion Bil hip flexibility is WFLs   Full  Hip extension      Hip abduction      Hip adduction      Hip internal rotation      Hip external rotation      Knee flexion 85 95 110   Knee extension 8 2 deg  09/15/23 18   Ankle dorsiflexion      Ankle plantarflexion      Ankle inversion      Ankle eversion       (Blank rows = not tested)  LOWER EXTREMITY MMT:  MMT Right eval Left eval  Hip flexion 4 4+  Hip extension 4+ 4+  Hip abduction 4 4+  Hip adduction    Hip internal rotation 4+ 4+  Hip external rotation 4+ 4+  Knee flexion 4 4+  Knee extension 4+ 4+  Ankle dorsiflexion    Ankle plantarflexion    Ankle inversion    Ankle eversion     (Blank rows = not tested)  GAIT: Distance walked: 50 Assistive device utilized:  none Level of assistance: Modified independence Comments: reduced time on Rt with reduced step length on Rt.  Circumduction of Rt LE with stepping up on step with Rt                                                                                                                                TREATMENT DATE:  09/24/2023: Recumbent bike 6' PT present to discuss status (patient initially could not go full revolution, had her loosen up for approx 5 min then had her do full reverse revolutions x 10 reps) Standing with right foot in chair at barre: lunging fwd to try and touch knee to barre (chair all the way against wall, foot at back of  chair) Rocker board x 2 min (verbal and tactile cues to isolate ankle mobility) Right gastroc stretch x 10 holding 10 sec on  rocker board propped at an angle on balance pad bilateral Right soleus stretch x 10 holding 10 sec Step up and hold on balance pad x 10 fwd and x 10 lateral SLS on right LE x 2 attempting to hold 10 (patient was able to hold the entire 10 sec on both reps) Leg press single 2 x 10 TKE only  Lunge to BOSU x 10 each LE fwd and x 10 each LE lateral  09/21/2023: NuStep L5 x 6' PT present to discuss status Seated Rt knee flexion foot on slider x20 (with cuing to slow down and do more purposeful motion) Seated bilateral hamstring stretch 2x20 sec Sit to/from stand x10 without UE use Standing terminal knee extension with green tband 2x10 right Supine quad set with small foam noodle under knee 2x10 right  Supine quad set with small foam noodle under ankle 2x10 right (with right knee extension missing 15 degrees from full extension) Supine right straight leg raise with 2# ankle weight 2x10 Left sidelying hip abduction with 2# ankle weight 2x10 Prone right hamstring curl 2x10 Prone hip extension x15 bilat Hooklying clamshells with green tband 2x10 Hooklying marching with green tband around knees 2x10 FWD step ups on 6" step with UE support x10 bilat Step downs on 6" step with UE support x10 left, x5 right   09/15/23 NuStep L5 x 6' PT present to discuss status Seated Rt knee flexion foot on slider x20, measures 95 deg today Seated HS stretch 2x30" and with active quad set x 10 Seated lumbar hang stretch 2x15" Sit to stand hands on thighs from mat table x10 TKE green band x 10 standing Supine bridge x10 Supine clam green tied band x10 S/L clam x 10 no band due to pain in knee Stair flight 1x - Pt using reciprocal pattern  Step ups with bil rail Rt LE 4" and 6"  step with focus on straight plane march x10 4 and 6 inch inch step down with L LE x 10 Standing heel raises  x20 with rail Standing hip ABD and mule kick x 10 B Prone knee flex x 15 Prone knee flex stretch with strap x 3 SLR x 15 SLS x 15 sec on R  PATIENT EDUCATION:  Education details: Access Code: [email or web address] Person educated: Patient Education method: Explanation, Demonstration, and Handouts Education comprehension: verbalized understanding and returned demonstration  HOME EXERCISE PROGRAM: Access Code: [email or web address] URL: [email or web address] Date: 09/11/2023 Prepared by: Loistine Simas Beuhring  Exercises - Seated Hamstring Stretch  - 3 x daily - 7 x weekly - 1 sets - 3 reps - 20 hold - Step Up  - 1-2 x daily - 7 x weekly - 1-2 sets - 10 reps - Gastroc Stretch on Wall  - 2 x daily - 7 x weekly - 1 sets - 3 reps - 20 hold - Supine Quad Set on Towel Roll  - 2 x daily - 7 x weekly - 2 sets - 10 reps - 5 hold - Seated Knee Flexion AAROM  - 3 x daily - 7 x weekly - 1 sets - 5 reps - 10 hold - Sit to Stand  - 2 x daily - 7 x weekly - 1-2 sets - 10 reps - Hooklying Clamshell with Resistance  - 1 x daily - 7 x weekly - 1-2 sets - 10 reps  ASSESSMENT:  CLINICAL IMPRESSION: Susan Woodward is progressing appropriately.  She lacks about 5-10 degrees of extension and lacks full extension at heel strike when walking.  She appears to be at around 90-100 degrees of flexion.  She tolerates weight bearing activities without increased pain.  She was able to hold SLS on involved LE for 10 seconds with minimal difficulty.  She has not taken much of her pain medication since surgery which seems to be leaving her quite uncomfortable once she settles down at night.  This causes her to lose sleep.  Suggested patient try to take her pain medication on a regular basis for a few days  Patient continues to require skilled PT to progress towards goal related activities.   OBJECTIVE IMPAIRMENTS: Abnormal gait, decreased activity tolerance, decreased balance, decreased mobility, difficulty walking, decreased ROM, decreased  strength, increased edema, increased fascial restrictions, increased muscle spasms, impaired flexibility, and pain.   ACTIVITY LIMITATIONS: lifting, standing, squatting, stairs, transfers, bathing, dressing, and locomotion level  PARTICIPATION LIMITATIONS: meal prep, cleaning, laundry, driving, shopping, community activity, and yard work  PERSONAL FACTORS: 1-2 comorbidities: Lt hip replacement, Rt knee replacement   are also affecting patient's functional outcome.   REHAB POTENTIAL: Good  CLINICAL DECISION MAKING: Stable/uncomplicated  EVALUATION COMPLEXITY: Moderate   GOALS: Goals reviewed with patient? Yes  SHORT TERM GOALS: Target date: 10/06/2023   Be independent in initial HEP Baseline: Goal status: Met on 09/21/23  2.  Improve Rt knee A/ROM flexion to > or = to 100 degrees to allow for sitting and steps without deviation Baseline: 85 P/ROM Goal status: Ongoing (see above)  3.  Demonstrate symmetry with ambulation on level surface Baseline:  Goal status: Ongoing  4.  Perform sit to stand without UE support due to improved knee and hip strength  Baseline:  Goal status: Met on 09/21/23   LONG TERM GOALS: Target date: 11/03/2023    Be independent in advanced HEP Baseline:  Goal status: INITIAL  2.  Improve LEFS to > or = to 60/80 to allow for return to prior level of function Baseline: 28/80 Goal status: INITIAL  3.  Improve strength and ROM to ascend and descend steps with step-over step pattern and use of 1 rail Baseline:  Goal status: INITIAL  4.  Demonstrate Rt knee A/ROM flexion to > or = to 120 degrees to allow for squatting for gardening Baseline: 85 Goal status: INITIAL  5.  Stand and walk in the community without limitation due to pain or fatigue  Baseline:  Goal status: INITIAL    PLAN:  PT FREQUENCY: 2x/week  PT DURATION: 8 weeks  PLANNED INTERVENTIONS: 97110-Therapeutic exercises, 97530- Therapeutic activity, 97112- Neuromuscular  re-education, 97535- Self Care, 43329- Manual therapy, [id number]- Gait training, [phone]- Canalith repositioning, [id number]- Aquatic Therapy, 97014- Electrical stimulation (unattended), [id number]- Electrical stimulation (manual), 97016- Vasopneumatic device, [id number]- Ultrasound, [id number]- Ionotophoresis 4mg /ml Dexamethasone, Patient/Family education, Balance training, Taping, Dry Needling, Joint mobilization, Joint manipulation, Vestibular training, Cryotherapy, and Moist heat  PLAN FOR NEXT SESSION: edema management, gait, ROM, functional strength and mobility, progress quad strengthening and terminal knee extension   Shelbey Spindler B. Loys Shugars, PT 09/24/23 8:12 PM [facility] [address], [address] [place], [place] [address] Phone # [phone] Fax [phone]

## 2023-09-25 ENCOUNTER — Encounter: Payer: Medicare Other | Admitting: Physical Therapy

## 2023-09-28 ENCOUNTER — Ambulatory Visit: Payer: Medicare Other | Attending: Orthopaedic Surgery

## 2023-09-28 DIAGNOSIS — M25561 Pain in right knee: Secondary | ICD-10-CM | POA: Insufficient documentation

## 2023-09-28 DIAGNOSIS — R600 Localized edema: Secondary | ICD-10-CM | POA: Diagnosis not present

## 2023-09-28 DIAGNOSIS — R2689 Other abnormalities of gait and mobility: Secondary | ICD-10-CM | POA: Diagnosis not present

## 2023-09-28 DIAGNOSIS — R262 Difficulty in walking, not elsewhere classified: Secondary | ICD-10-CM | POA: Insufficient documentation

## 2023-09-28 DIAGNOSIS — M25661 Stiffness of right knee, not elsewhere classified: Secondary | ICD-10-CM | POA: Diagnosis not present

## 2023-09-28 DIAGNOSIS — M6281 Muscle weakness (generalized): Secondary | ICD-10-CM | POA: Insufficient documentation

## 2023-09-28 NOTE — Therapy (Signed)
 OUTPATIENT PHYSICAL THERAPY LOWER EXTREMITY TREATMENT   Patient Name: Susan Woodward MRN: [id number] DOB:[birth year], 68 y.o., female Today's Date: 09/28/2023  END OF SESSION:  PT End of Session - 09/28/23 1144     Visit Number 6    Date for PT Re-Evaluation 11/03/23    Authorization Type [employer] Medicare    Progress Note Due on Visit 10    PT Start Time 1100    PT Stop Time 1141    PT Time Calculation (min) 41 min    Activity Tolerance Patient tolerated treatment well    Behavior During Therapy WFL for tasks assessed/performed                Past Medical History:  Diagnosis Date   Arthritis    GERD (gastroesophageal reflux disease)    Hypertension    Osteoporosis    PONV (postoperative nausea and vomiting)    Past Surgical History:  Procedure Laterality Date   BACK SURGERY  08/16/2020   L4-5 fusion by Dr. Lelon Perla   TOTAL HIP ARTHROPLASTY Left 01/02/2023   Procedure: LEFT TOTAL HIP ARTHROPLASTY ANTERIOR APPROACH;  Surgeon: Kathryne Hitch, MD;  Location: [facility];  Service: Orthopedics;  Laterality: Left;   TOTAL KNEE ARTHROPLASTY Right 08/21/2023   Procedure: RIGHT TOTAL KNEE ARTHROPLASTY;  Surgeon: Kathryne Hitch, MD;  Location: [facility];  Service: Orthopedics;  Laterality: Right;   WRIST SURGERY Right    12 years ago   Patient Active Problem List   Diagnosis Date Noted   Status post right knee replacement 08/21/2023   Status post total replacement of left hip 01/02/2023   Degenerative spondylolisthesis 08/26/2021   Spinal stenosis of lumbar region 07/13/2017    PCP: Soundra Pilon, FNP  REFERRING PROVIDER: Doneen Poisson, MD  REFERRING DIAG:  Diagnosis  [phone] (ICD-10-CM) - Status post right knee replacement    THERAPY DIAG:  Difficulty in walking, not elsewhere classified  Muscle weakness (generalized)  Other abnormalities of gait and mobility  Acute pain of right knee  Stiffness of right knee, not elsewhere  classified  Rationale for Evaluation and Treatment: Rehabilitation  ONSET DATE: surgery for TKA on 08/21/23, chronic OA   SUBJECTIVE:   SUBJECTIVE STATEMENT: Sleep is better. I am taking pain meds to help with sleep and I am getting more rest.  I did some yard work over the weekend.   From MD visit on 09/03/23:  The patient is here today for first postoperative follow-up visit status post a right total knee replacement.  She is someone who does not tolerate pain medications at all so she is not really taking any.  She has been having home health therapy.   Her right calf is soft.  Her right knee has almost full extension and we can flex her to about 70 degrees.  Incision looks good.  Staples were removed and Steri-Strips applied.      PERTINENT HISTORY: GERD, HTN, osteoporosis, L4-5 fusion 08/2020, Lt Total hip replacement 01/02/23  PAIN: 09/28/23 Are you having pain? Yes: NPRS scale: 0/10 Pain location: Rt knee  Pain description: dull ache Aggravating factors: exercise, movement, walking for long periods  Relieving factors: ice, Tylenol   PRECAUTIONS: None  RED FLAGS: None   WEIGHT BEARING RESTRICTIONS: No  FALLS:  Has patient fallen in last 6 months? No  LIVING ENVIRONMENT: Lives with: lives with their spouse Lives in: House/apartment Stairs: Yes: External: 3 steps; none Has following equipment at home: Single point cane and  Walker - 2 wheeled  OCCUPATION: retired   PLOF: Independent, Vocation/Vocational requirements: retired , and Leisure: yardwork, walking for exercise   PATIENT GOALS: return to prior level of function, reduce pain  NEXT MD VISIT: 10/01/23  OBJECTIVE:  Note: Objective measures were completed at Evaluation unless otherwise noted.  DIAGNOSTIC FINDINGS: NA  PATIENT SURVEYS:  09/08/23: LEFS 28/80=35%  COGNITION: Overall cognitive status: Within functional limits for tasks assessed     SENSATION: WFL  EDEMA:  Minimal post-op edema   MUSCLE  LENGTH: Limited hamstring length due to edema and post-op  POSTURE: No Significant postural limitations  PALPATION: Good patellar mobility on the Rt -medial/lateral.  Reduced mobility with superior/inferior.  Mild edema about the Rt knee joint.  Tension and tenderness over distal hamstring insertions.  Well healing surgical incision.    LOWER EXTREMITY ROM:  Passive ROM Right eval Right 2/14 Right 09/21/23 In sitting Right  Supine  09/28/23 Left eval  Hip flexion Bil hip flexibility is WFLs    Full  Hip extension       Hip abduction       Hip adduction       Hip internal rotation       Hip external rotation       Knee flexion 85 95 110 103   Knee extension 8 2 deg  09/15/23 18 Lacking 8   Ankle dorsiflexion       Ankle plantarflexion       Ankle inversion       Ankle eversion        (Blank rows = not tested)  LOWER EXTREMITY MMT:  MMT Right eval Left eval  Hip flexion 4 4+  Hip extension 4+ 4+  Hip abduction 4 4+  Hip adduction    Hip internal rotation 4+ 4+  Hip external rotation 4+ 4+  Knee flexion 4 4+  Knee extension 4+ 4+  Ankle dorsiflexion    Ankle plantarflexion    Ankle inversion    Ankle eversion     (Blank rows = not tested)  GAIT: Distance walked: 50 Assistive device utilized:  none Level of assistance: Modified independence Comments: reduced time on Rt with reduced step length on Rt.  Circumduction of Rt LE with stepping up on step with Rt                                                                                                                                TREATMENT DATE:  09/28/2023: Recumbent bike 6' PT present to discuss status (partial ROM with stretch at end range) Heel slides sitting: Rt x10 Rocker board x 2 min (verbal and tactile cues to isolate ankle mobility) Right gastroc stretch x 10 holding 10 sec on rocker board propped at an angle on balance pad bilateral Sit to stand: 5# kettlebell 2x10 Step up and hold on balance pad x  10 fwd and x 10 lateral SLS on right  LE x 2 attempting to hold 10 (patient was able to hold the entire 10 sec on both reps) Leg press: seat 5: 55# bil 2x10, 30# Rt only 2x10 6" step up on Rt 2x10, step down 4" step x10, 6" step x5 BOSU 2x10 forward on Rt, lateral 2x10  09/24/2023: Recumbent bike 6' PT present to discuss status (patient initially could not go full revolution, had her loosen up for approx 5 min then had her do full reverse revolutions x 10 reps) Standing with right foot in chair at barre: lunging fwd to try and touch knee to barre (chair all the way against wall, foot at back of chair) Rocker board x 2 min (verbal and tactile cues to isolate ankle mobility) Right gastroc stretch x 10 holding 10 sec on rocker board propped at an angle on balance pad bilateral Right soleus stretch x 10 holding 10 sec Step up and hold on balance pad x 10 fwd and x 10 lateral SLS on right LE x 2 attempting to hold 10 (patient was able to hold the entire 10 sec on both reps) Leg press single 2 x 10 TKE only  Lunge to BOSU x 10 each LE fwd and x 10 each LE lateral  09/21/2023: NuStep L5 x 6' PT present to discuss status Seated Rt knee flexion foot on slider x20 (with cuing to slow down and do more purposeful motion) Seated bilateral hamstring stretch 2x20 sec Sit to/from stand x10 without UE use Standing terminal knee extension with green tband 2x10 right Supine quad set with small foam noodle under knee 2x10 right  Supine quad set with small foam noodle under ankle 2x10 right (with right knee extension missing 15 degrees from full extension) Supine right straight leg raise with 2# ankle weight 2x10 Left sidelying hip abduction with 2# ankle weight 2x10 Prone right hamstring curl 2x10 Prone hip extension x15 bilat Hooklying clamshells with green tband 2x10 Hooklying marching with green tband around knees 2x10 FWD step ups on 6" step with UE support x10 bilat Step downs on 6" step with UE  support x10 left, x5 right  PATIENT EDUCATION:  Education details: Access Code: [email or web address] Person educated: Patient Education method: Explanation, Demonstration, and Handouts Education comprehension: verbalized understanding and returned demonstration  HOME EXERCISE PROGRAM: Access Code: [email or web address] URL: [email or web address] Date: 09/11/2023 Prepared by: Loistine Simas Beuhring  Exercises - Seated Hamstring Stretch  - 3 x daily - 7 x weekly - 1 sets - 3 reps - 20 hold - Step Up  - 1-2 x daily - 7 x weekly - 1-2 sets - 10 reps - Gastroc Stretch on Wall  - 2 x daily - 7 x weekly - 1 sets - 3 reps - 20 hold - Supine Quad Set on Towel Roll  - 2 x daily - 7 x weekly - 2 sets - 10 reps - 5 hold - Seated Knee Flexion AAROM  - 3 x daily - 7 x weekly - 1 sets - 5 reps - 10 hold - Sit to Stand  - 2 x daily - 7 x weekly - 1-2 sets - 10 reps - Hooklying Clamshell with Resistance  - 1 x daily - 7 x weekly - 1-2 sets - 10 reps  ASSESSMENT:  CLINICAL IMPRESSION: Jerene is making steady progress s/p Rt TKA.   She is walking all distances without device and demonstrates mild antalgia due to reduced Rt knee extension with heel strike and reduced time spent in stance on Rt.  She tolerates weight bearing activities without increased pain.   She is taking pain meds at night now and is sleeping much better.  She has good technique with steps today and was challenged at first with eccentric control and this improved with practice. Patient continues to require skilled PT to progress towards goal related activities.   OBJECTIVE IMPAIRMENTS: Abnormal gait, decreased activity tolerance, decreased balance, decreased mobility, difficulty walking, decreased ROM, decreased strength, increased edema, increased fascial restrictions, increased muscle spasms, impaired flexibility, and pain.   ACTIVITY LIMITATIONS: lifting, standing, squatting, stairs, transfers, bathing, dressing, and locomotion  level  PARTICIPATION LIMITATIONS: meal prep, cleaning, laundry, driving, shopping, community activity, and yard work  PERSONAL FACTORS: 1-2 comorbidities: Lt hip replacement, Rt knee replacement   are also affecting patient's functional outcome.   REHAB POTENTIAL: Good  CLINICAL DECISION MAKING: Stable/uncomplicated  EVALUATION COMPLEXITY: Moderate   GOALS: Goals reviewed with patient? Yes  SHORT TERM GOALS: Target date: 10/06/2023   Be independent in initial HEP Baseline: Goal status: Met on 09/21/23  2.  Improve Rt knee A/ROM flexion to > or = to 100 degrees to allow for sitting and steps without deviation Baseline: 100 A/ROM, 103 P/ROM (09/28/23) Goal status: MET  3.  Demonstrate symmetry with ambulation on level surface Baseline: no device with mild antalgia (09/28/23) Goal status: Ongoing  4.  Perform sit to stand without UE support due to improved knee and hip strength  Baseline:  Goal status: Met on 09/21/23   LONG TERM GOALS: Target date: 11/03/2023    Be independent in advanced HEP Baseline:  Goal status: INITIAL  2.  Improve LEFS to > or = to 60/80 to allow for return to prior level of function Baseline: 28/80 Goal status: INITIAL  3.  Improve strength and ROM to ascend and descend steps with step-over step pattern and use of 1 rail Baseline:  Goal status: INITIAL  4.  Demonstrate Rt knee A/ROM flexion to > or = to 120 degrees to allow for squatting for gardening Baseline: 85 Goal status: INITIAL  5.  Stand and walk in the community without limitation due to pain or fatigue  Baseline:  Goal status: INITIAL    PLAN:  PT FREQUENCY: 2x/week  PT DURATION: 8 weeks  PLANNED INTERVENTIONS: 97110-Therapeutic exercises, 97530- Therapeutic activity, 97112- Neuromuscular re-education, 97535- Self Care, 16109- Manual therapy, [id number]- Gait training, [phone]- Canalith repositioning, [id number]- Aquatic Therapy, 97014- Electrical stimulation (unattended), [id number]- Electrical  stimulation (manual), 97016- Vasopneumatic device, [id number]- Ultrasound, [id number]- Ionotophoresis 4mg /ml Dexamethasone, Patient/Family education, Balance training, Taping, Dry Needling, Joint mobilization, Joint manipulation, Vestibular training, Cryotherapy, and Moist heat  PLAN FOR NEXT SESSION: edema management, gait, ROM, functional strength and mobility, progress quad strengthening and terminal knee extension   Lorrene Reid, PT 09/28/23 11:45 AM  [facility] [address] [place], [place] [address] Phone # [phone] Fax [phone]

## 2023-10-01 ENCOUNTER — Encounter: Payer: Self-pay | Admitting: Orthopaedic Surgery

## 2023-10-01 ENCOUNTER — Ambulatory Visit: Payer: Medicare Other | Admitting: Orthopaedic Surgery

## 2023-10-01 ENCOUNTER — Ambulatory Visit: Payer: Medicare Other

## 2023-10-01 DIAGNOSIS — R600 Localized edema: Secondary | ICD-10-CM | POA: Diagnosis not present

## 2023-10-01 DIAGNOSIS — M25661 Stiffness of right knee, not elsewhere classified: Secondary | ICD-10-CM | POA: Diagnosis not present

## 2023-10-01 DIAGNOSIS — M25561 Pain in right knee: Secondary | ICD-10-CM

## 2023-10-01 DIAGNOSIS — M6281 Muscle weakness (generalized): Secondary | ICD-10-CM

## 2023-10-01 DIAGNOSIS — R262 Difficulty in walking, not elsewhere classified: Secondary | ICD-10-CM

## 2023-10-01 DIAGNOSIS — Z96651 Presence of right artificial knee joint: Secondary | ICD-10-CM

## 2023-10-01 DIAGNOSIS — R2689 Other abnormalities of gait and mobility: Secondary | ICD-10-CM

## 2023-10-01 NOTE — Progress Notes (Signed)
 The patient is a 68 year old female who is now 6 weeks out from right total knee arthroplasty.  She reports only pain at night when she is trying to sleep but overall she is doing great.  Even therapy a touch of the fact that she is so ahead of what most people are at this point after surgery.  On exam her extension is almost full and her flexion is almost full.  The knee feels stable on exam.  There are some swelling to be expected.  Since she is so far ahead of most people, we do not really need to see her back for 3 months unless she has issues.  At that visit we will have a standing AP and lateral of her right operative knee.  If there are issues before then she knows to let us know.

## 2023-10-01 NOTE — Therapy (Signed)
 OUTPATIENT PHYSICAL THERAPY LOWER EXTREMITY TREATMENT   Patient Name: Susan Woodward MRN: [id number] DOB:07-19-1956, 68 y.o., female Today's Date: 10/01/2023  END OF SESSION:  PT End of Session - 10/01/23 1240     Visit Number 7    Date for PT Re-Evaluation 11/03/23    Authorization Type BCBS Medicare    Progress Note Due on Visit 10    PT Start Time 1147    PT Stop Time 1231    PT Time Calculation (min) 44 min    Activity Tolerance Patient tolerated treatment well    Behavior During Therapy WFL for tasks assessed/performed                 Past Medical History:  Diagnosis Date   Arthritis    GERD (gastroesophageal reflux disease)    Hypertension    Osteoporosis    PONV (postoperative nausea and vomiting)    Past Surgical History:  Procedure Laterality Date   BACK SURGERY  08/16/2020   L4-5 fusion by Dr. Lelon Perla   TOTAL HIP ARTHROPLASTY Left 01/02/2023   Procedure: LEFT TOTAL HIP ARTHROPLASTY ANTERIOR APPROACH;  Surgeon: Kathryne Hitch, MD;  Location: [facility];  Service: Orthopedics;  Laterality: Left;   TOTAL KNEE ARTHROPLASTY Right 08/21/2023   Procedure: RIGHT TOTAL KNEE ARTHROPLASTY;  Surgeon: Kathryne Hitch, MD;  Location: [facility];  Service: Orthopedics;  Laterality: Right;   WRIST SURGERY Right    12 years ago   Patient Active Problem List   Diagnosis Date Noted   Status post right knee replacement 08/21/2023   Status post total replacement of left hip 01/02/2023   Degenerative spondylolisthesis 08/26/2021   Spinal stenosis of lumbar region 07/13/2017    PCP: Soundra Pilon, FNP  REFERRING PROVIDER: Doneen Poisson, MD  REFERRING DIAG:  Diagnosis  [phone] (ICD-10-CM) - Status post right knee replacement    THERAPY DIAG:  Difficulty in walking, not elsewhere classified  Muscle weakness (generalized)  Other abnormalities of gait and mobility  Acute pain of right knee  Stiffness of right knee, not elsewhere  classified  Rationale for Evaluation and Treatment: Rehabilitation  ONSET DATE: surgery for TKA on 08/21/23, chronic OA   SUBJECTIVE:   SUBJECTIVE STATEMENT: Sleep is better. I am taking pain meds to help with sleep and I am getting more rest.  I did some yard work over the weekend.   From MD visit on 09/03/23:  The patient is here today for first postoperative follow-up visit status post a right total knee replacement.  She is someone who does not tolerate pain medications at all so she is not really taking any.  She has been having home health therapy.   Her right calf is soft.  Her right knee has almost full extension and we can flex her to about 70 degrees.  Incision looks good.  Staples were removed and Steri-Strips applied.      PERTINENT HISTORY: GERD, HTN, osteoporosis, L4-5 fusion 08/2020, Lt Total hip replacement 01/02/23  PAIN: 09/28/23 Are you having pain? Yes: NPRS scale: 0/10 Pain location: Rt knee  Pain description: dull ache Aggravating factors: exercise, movement, walking for long periods  Relieving factors: ice, Tylenol   PRECAUTIONS: None  RED FLAGS: None   WEIGHT BEARING RESTRICTIONS: No  FALLS:  Has patient fallen in last 6 months? No  LIVING ENVIRONMENT: Lives with: lives with their spouse Lives in: House/apartment Stairs: Yes: External: 3 steps; none Has following equipment at home: Single point cane  and Walker - 2 wheeled  OCCUPATION: retired   PLOF: Independent, Vocation/Vocational requirements: retired , and Leisure: yardwork, walking for exercise   PATIENT GOALS: return to prior level of function, reduce pain  NEXT MD VISIT: 10/01/23  OBJECTIVE:  Note: Objective measures were completed at Evaluation unless otherwise noted.  DIAGNOSTIC FINDINGS: NA  PATIENT SURVEYS:  09/08/23: LEFS 28/80=35%  COGNITION: Overall cognitive status: Within functional limits for tasks assessed     SENSATION: WFL  EDEMA:  Minimal post-op edema   MUSCLE  LENGTH: Limited hamstring length due to edema and post-op  POSTURE: No Significant postural limitations  PALPATION: Good patellar mobility on the Rt -medial/lateral.  Reduced mobility with superior/inferior.  Mild edema about the Rt knee joint.  Tension and tenderness over distal hamstring insertions.  Well healing surgical incision.    LOWER EXTREMITY ROM:  Passive ROM Right eval Right 2/14 Right 09/21/23 In sitting Right  Supine  09/28/23 Left eval  Hip flexion Bil hip flexibility is WFLs    Full  Hip extension       Hip abduction       Hip adduction       Hip internal rotation       Hip external rotation       Knee flexion 85 95 110 103   Knee extension 8 2 deg  09/15/23 18 Lacking 8   Ankle dorsiflexion       Ankle plantarflexion       Ankle inversion       Ankle eversion        (Blank rows = not tested)  LOWER EXTREMITY MMT:  MMT Right eval Left eval  Hip flexion 4 4+  Hip extension 4+ 4+  Hip abduction 4 4+  Hip adduction    Hip internal rotation 4+ 4+  Hip external rotation 4+ 4+  Knee flexion 4 4+  Knee extension 4+ 4+  Ankle dorsiflexion    Ankle plantarflexion    Ankle inversion    Ankle eversion     (Blank rows = not tested)  GAIT: Distance walked: 50 Assistive device utilized:  none Level of assistance: Modified independence Comments: reduced time on Rt with reduced step length on Rt.  Circumduction of Rt LE with stepping up on step with Rt                                                                                                                                TREATMENT DATE:    10/01/2023 Bike: ROM x 8 min with hold at end range flexion Recumbent bike 6' PT present to discuss status (partial ROM with stretch at end range) Rocker board x 2 min (verbal and tactile cues to isolate ankle mobility) Right gastroc stretch x 10 holding 10 sec on rocker board propped at an angle on balance pad bilateral Sit to stand: 5# kettlebell 2x10 SLR on Rt  with quad set 2x10 Step  up and hold on balance pad x 10 fwd and x 10 lateral SLS on right LE on blue pod 10" hold x6 Leg press: seat 5: 55# bil 2x10, 30# Rt only 2x10 6" step up on Rt 2x10   09/28/2023: Recumbent bike 6' PT present to discuss status (partial ROM with stretch at end range) Heel slides sitting: Rt x10 Rocker board x 2 min (verbal and tactile cues to isolate ankle mobility) Right gastroc stretch x 10 holding 10 sec on rocker board propped at an angle on balance pad bilateral Sit to stand: 5# kettlebell 2x10 Step up and hold on balance pad x 10 fwd and x 10 lateral SLS on right LE x 2 attempting to hold 10 (patient was able to hold the entire 10 sec on both reps) Leg press: seat 5: 55# bil 2x10, 30# Rt only 2x10 6" step up on Rt 2x10, step down 4" step x10, 6" step x5 BOSU 2x10 forward on Rt, lateral 2x10  09/24/2023: Recumbent bike 6' PT present to discuss status (patient initially could not go full revolution, had her loosen up for approx 5 min then had her do full reverse revolutions x 10 reps) Standing with right foot in chair at barre: lunging fwd to try and touch knee to barre (chair all the way against wall, foot at back of chair) Rocker board x 2 min (verbal and tactile cues to isolate ankle mobility) Right gastroc stretch x 10 holding 10 sec on rocker board propped at an angle on balance pad bilateral Right soleus stretch x 10 holding 10 sec Step up and hold on balance pad x 10 fwd and x 10 lateral SLS on right LE x 2 attempting to hold 10 (patient was able to hold the entire 10 sec on both reps) Leg press single 2 x 10 TKE only  Lunge to BOSU x 10 each LE fwd and x 10 each LE lateral  PATIENT EDUCATION:  Education details: Access Code: [email or web address] Person educated: Patient Education method: Explanation, Demonstration, and Handouts Education comprehension: verbalized understanding and returned demonstration  HOME EXERCISE PROGRAM: Access Code: [email or web address] URL:  [email or web address] Date: 09/11/2023 Prepared by: Loistine Simas Beuhring  Exercises - Seated Hamstring Stretch  - 3 x daily - 7 x weekly - 1 sets - 3 reps - 20 hold - Step Up  - 1-2 x daily - 7 x weekly - 1-2 sets - 10 reps - Gastroc Stretch on Wall  - 2 x daily - 7 x weekly - 1 sets - 3 reps - 20 hold - Supine Quad Set on Towel Roll  - 2 x daily - 7 x weekly - 2 sets - 10 reps - 5 hold - Seated Knee Flexion AAROM  - 3 x daily - 7 x weekly - 1 sets - 5 reps - 10 hold - Sit to Stand  - 2 x daily - 7 x weekly - 1-2 sets - 10 reps - Hooklying Clamshell with Resistance  - 1 x daily - 7 x weekly - 1-2 sets - 10 reps  ASSESSMENT:  CLINICAL IMPRESSION: Pt continues to make progress s/p TKA. She has some increased antalgia due to reduced knee extension.  She tolerates all activities without increased pain.  She demonstrates improved proprioception and demonstrates reduced substitution with step up on the Rt.  Rt knee ROM is 8-103 degrees with some increased edema today.  PT monitored throughout session for technique and to monitor for pain. Patient continues to require skilled PT to  progress towards goal related activities.   OBJECTIVE IMPAIRMENTS: Abnormal gait, decreased activity tolerance, decreased balance, decreased mobility, difficulty walking, decreased ROM, decreased strength, increased edema, increased fascial restrictions, increased muscle spasms, impaired flexibility, and pain.   ACTIVITY LIMITATIONS: lifting, standing, squatting, stairs, transfers, bathing, dressing, and locomotion level  PARTICIPATION LIMITATIONS: meal prep, cleaning, laundry, driving, shopping, community activity, and yard work  PERSONAL FACTORS: 1-2 comorbidities: Lt hip replacement, Rt knee replacement   are also affecting patient's functional outcome.   REHAB POTENTIAL: Good  CLINICAL DECISION MAKING: Stable/uncomplicated  EVALUATION COMPLEXITY: Moderate   GOALS: Goals reviewed with patient?  Yes  SHORT TERM GOALS: Target date: 10/06/2023   Be independent in initial HEP Baseline: Goal status: Met on 09/21/23  2.  Improve Rt knee A/ROM flexion to > or = to 100 degrees to allow for sitting and steps without deviation Baseline: 100 A/ROM, 103 P/ROM (09/28/23) Goal status: MET  3.  Demonstrate symmetry with ambulation on level surface Baseline: no device with mild antalgia (09/28/23) Goal status: Ongoing  4.  Perform sit to stand without UE support due to improved knee and hip strength  Baseline:  Goal status: Met on 09/21/23   LONG TERM GOALS: Target date: 11/03/2023    Be independent in advanced HEP Baseline:  Goal status: INITIAL  2.  Improve LEFS to > or = to 60/80 to allow for return to prior level of function Baseline: 28/80 Goal status: INITIAL  3.  Improve strength and ROM to ascend and descend steps with step-over step pattern and use of 1 rail Baseline:  Goal status: INITIAL  4.  Demonstrate Rt knee A/ROM flexion to > or = to 120 degrees to allow for squatting for gardening Baseline: 85 Goal status: INITIAL  5.  Stand and walk in the community without limitation due to pain or fatigue  Baseline:  Goal status: INITIAL    PLAN:  PT FREQUENCY: 2x/week  PT DURATION: 8 weeks  PLANNED INTERVENTIONS: 97110-Therapeutic exercises, 97530- Therapeutic activity, 97112- Neuromuscular re-education, 97535- Self Care, 14782- Manual therapy, [id number]- Gait training, [phone]- Canalith repositioning, [id number]- Aquatic Therapy, 97014- Electrical stimulation (unattended), [id number]- Electrical stimulation (manual), 97016- Vasopneumatic device, [id number]- Ultrasound, [id number]- Ionotophoresis 4mg /ml Dexamethasone, Patient/Family education, Balance training, Taping, Dry Needling, Joint mobilization, Joint manipulation, Vestibular training, Cryotherapy, and Moist heat  PLAN FOR NEXT SESSION: edema management, gait, ROM, functional strength and mobility, progress quad strengthening and terminal  knee extension   Lorrene Reid, PT 10/01/23 12:42 PM  [facility] [address] [place], [place] [address] Phone # [phone] Fax [phone]

## 2023-10-05 ENCOUNTER — Ambulatory Visit: Payer: Medicare Other

## 2023-10-05 DIAGNOSIS — R262 Difficulty in walking, not elsewhere classified: Secondary | ICD-10-CM | POA: Diagnosis not present

## 2023-10-05 DIAGNOSIS — R2689 Other abnormalities of gait and mobility: Secondary | ICD-10-CM

## 2023-10-05 DIAGNOSIS — R600 Localized edema: Secondary | ICD-10-CM | POA: Diagnosis not present

## 2023-10-05 DIAGNOSIS — M25561 Pain in right knee: Secondary | ICD-10-CM | POA: Diagnosis not present

## 2023-10-05 DIAGNOSIS — M6281 Muscle weakness (generalized): Secondary | ICD-10-CM

## 2023-10-05 DIAGNOSIS — M25661 Stiffness of right knee, not elsewhere classified: Secondary | ICD-10-CM | POA: Diagnosis not present

## 2023-10-05 NOTE — Therapy (Signed)
 OUTPATIENT PHYSICAL THERAPY LOWER EXTREMITY TREATMENT   Patient Name: Susan Woodward MRN: [id number] DOB:1955/11/08, 68 y.o., female Today's Date: 10/05/2023  END OF SESSION:  PT End of Session - 10/05/23 1228     Visit Number 8    Date for PT Re-Evaluation 11/03/23    Authorization Type BCBS Medicare    Progress Note Due on Visit 10    PT Start Time 1148    PT Stop Time 1229    PT Time Calculation (min) 41 min    Activity Tolerance Patient tolerated treatment well    Behavior During Therapy WFL for tasks assessed/performed                  Past Medical History:  Diagnosis Date   Arthritis    GERD (gastroesophageal reflux disease)    Hypertension    Osteoporosis    PONV (postoperative nausea and vomiting)    Past Surgical History:  Procedure Laterality Date   BACK SURGERY  08/16/2020   L4-5 fusion by Dr. Lelon Perla   TOTAL HIP ARTHROPLASTY Left 01/02/2023   Procedure: LEFT TOTAL HIP ARTHROPLASTY ANTERIOR APPROACH;  Surgeon: Kathryne Hitch, MD;  Location: [facility];  Service: Orthopedics;  Laterality: Left;   TOTAL KNEE ARTHROPLASTY Right 08/21/2023   Procedure: RIGHT TOTAL KNEE ARTHROPLASTY;  Surgeon: Kathryne Hitch, MD;  Location: [facility];  Service: Orthopedics;  Laterality: Right;   WRIST SURGERY Right    12 years ago   Patient Active Problem List   Diagnosis Date Noted   Status post right knee replacement 08/21/2023   Status post total replacement of left hip 01/02/2023   Degenerative spondylolisthesis 08/26/2021   Spinal stenosis of lumbar region 07/13/2017    PCP: Soundra Pilon, FNP  REFERRING PROVIDER: Doneen Poisson, MD  REFERRING DIAG:  Diagnosis  [phone] (ICD-10-CM) - Status post right knee replacement    THERAPY DIAG:  Difficulty in walking, not elsewhere classified  Muscle weakness (generalized)  Other abnormalities of gait and mobility  Acute pain of right knee  Rationale for Evaluation and Treatment:  Rehabilitation  ONSET DATE: surgery for TKA on 08/21/23, chronic OA   SUBJECTIVE:   SUBJECTIVE STATEMENT: I saw the MD and he said I am doing so well that I don't need to return for 3 months.   From MD visit on 09/03/23:  The patient is here today for first postoperative follow-up visit status post a right total knee replacement.  She is someone who does not tolerate pain medications at all so she is not really taking any.  She has been having home health therapy.   Her right calf is soft.  Her right knee has almost full extension and we can flex her to about 70 degrees.  Incision looks good.  Staples were removed and Steri-Strips applied.      PERTINENT HISTORY: GERD, HTN, osteoporosis, L4-5 fusion 08/2020, Lt Total hip replacement 01/02/23  PAIN: 10/05/23 Are you having pain? Yes: NPRS scale: 0/10 Pain location: Rt knee  Pain description: dull ache Aggravating factors: exercise, movement, walking for long periods  Relieving factors: ice, Tylenol   PRECAUTIONS: None  RED FLAGS: None   WEIGHT BEARING RESTRICTIONS: No  FALLS:  Has patient fallen in last 6 months? No  LIVING ENVIRONMENT: Lives with: lives with their spouse Lives in: House/apartment Stairs: Yes: External: 3 steps; none Has following equipment at home: Single point cane and Walker - 2 wheeled  OCCUPATION: retired   PLOF: Independent, Vocation/Vocational  requirements: retired , and Leisure: yardwork, walking for exercise   PATIENT GOALS: return to prior level of function, reduce pain  NEXT MD VISIT: 10/01/23  OBJECTIVE:  Note: Objective measures were completed at Evaluation unless otherwise noted.  DIAGNOSTIC FINDINGS: NA  PATIENT SURVEYS:  09/08/23: LEFS 28/80=35%  COGNITION: Overall cognitive status: Within functional limits for tasks assessed     SENSATION: WFL  EDEMA:  Minimal post-op edema   MUSCLE LENGTH: Limited hamstring length due to edema and post-op  POSTURE: No Significant postural  limitations  PALPATION: Good patellar mobility on the Rt -medial/lateral.  Reduced mobility with superior/inferior.  Mild edema about the Rt knee joint.  Tension and tenderness over distal hamstring insertions.  Well healing surgical incision.    LOWER EXTREMITY ROM:  Passive ROM Right eval Right 2/14 Right 09/21/23 In sitting Right  Supine  09/28/23 Left eval  Hip flexion Bil hip flexibility is WFLs    Full  Hip extension       Hip abduction       Hip adduction       Hip internal rotation       Hip external rotation       Knee flexion 85 95 110 103   Knee extension 8 2 deg  09/15/23 18 Lacking 8   Ankle dorsiflexion       Ankle plantarflexion       Ankle inversion       Ankle eversion        (Blank rows = not tested)  LOWER EXTREMITY MMT:  MMT Right eval Left eval  Hip flexion 4 4+  Hip extension 4+ 4+  Hip abduction 4 4+  Hip adduction    Hip internal rotation 4+ 4+  Hip external rotation 4+ 4+  Knee flexion 4 4+  Knee extension 4+ 4+  Ankle dorsiflexion    Ankle plantarflexion    Ankle inversion    Ankle eversion     (Blank rows = not tested)  GAIT: Distance walked: 50 Assistive device utilized:  none Level of assistance: Modified independence Comments: reduced time on Rt with reduced step length on Rt.  Circumduction of Rt LE with stepping up on step with Rt                                                                                                                                TREATMENT DATE:   10/05/2023 NuStep: Level 5x 8 minutes- PT present to discuss progress  Rocker board x 3 min  Resisted walking:  Sit to stand: 5# kettlebell 2x10 SLR on Rt with quad set 2x10 Step up and hold on balance pad x 10 fwd and x 10 lateral Leg press: seat 5: 60# bil 2x10, 30# Rt only 2x10 6" step up on Rt 2x10 4" step-down on Rt x10  10/01/2023 Bike: ROM x 8 min with hold at end range flexion Recumbent bike 6' PT present  to discuss status (partial ROM with  stretch at end range) Rocker board x 2 min (verbal and tactile cues to isolate ankle mobility) Right gastroc stretch x 10 holding 10 sec on rocker board propped at an angle on balance pad bilateral Sit to stand: 5# kettlebell 2x10 SLR on Rt with quad set 2x10 Step up and hold on balance pad x 10 fwd and x 10 lateral SLS on right LE on blue pod 10" hold x6 Leg press: seat 5: 55# bil 2x10, 30# Rt only 2x10 6" step up on Rt 2x10   09/28/2023: Recumbent bike 6' PT present to discuss status (partial ROM with stretch at end range) Heel slides sitting: Rt x10 Rocker board x 2 min (verbal and tactile cues to isolate ankle mobility) Right gastroc stretch x 10 holding 10 sec on rocker board propped at an angle on balance pad bilateral Sit to stand: 5# kettlebell 2x10 Step up and hold on balance pad x 10 fwd and x 10 lateral SLS on right LE x 2 attempting to hold 10 (patient was able to hold the entire 10 sec on both reps) Leg press: seat 5: 55# bil 2x10, 30# Rt only 2x10 6" step up on Rt 2x10, step down 4" step x10, 6" step x5 BOSU 2x10 forward on Rt, lateral 2x10   PATIENT EDUCATION:  Education details: Access Code: [email or web address] Person educated: Patient Education method: Explanation, Demonstration, and Handouts Education comprehension: verbalized understanding and returned demonstration  HOME EXERCISE PROGRAM: Access Code: [email or web address] URL: [email or web address] Date: 09/11/2023 Prepared by: Loistine Simas Beuhring  Exercises - Seated Hamstring Stretch  - 3 x daily - 7 x weekly - 1 sets - 3 reps - 20 hold - Step Up  - 1-2 x daily - 7 x weekly - 1-2 sets - 10 reps - Gastroc Stretch on Wall  - 2 x daily - 7 x weekly - 1 sets - 3 reps - 20 hold - Supine Quad Set on Towel Roll  - 2 x daily - 7 x weekly - 2 sets - 10 reps - 5 hold - Seated Knee Flexion AAROM  - 3 x daily - 7 x weekly - 1 sets - 5 reps - 10 hold - Sit to Stand  - 2 x daily - 7 x weekly - 1-2 sets - 10 reps - Hooklying  Clamshell with Resistance  - 1 x daily - 7 x weekly - 1-2 sets - 10 reps  ASSESSMENT:  CLINICAL IMPRESSION: Pt saw MD and he was pleased with her progress.  She will return to see him in 3 months.  She tolerates all activities without increased pain today and did well with increased weight with bil legs on leg press.  She did well with resisted walking today for improved proprioception.  PT monitored throughout session for technique and to monitor for pain. Patient continues to require skilled PT to progress towards goal related activities.  OBJECTIVE IMPAIRMENTS: Abnormal gait, decreased activity tolerance, decreased balance, decreased mobility, difficulty walking, decreased ROM, decreased strength, increased edema, increased fascial restrictions, increased muscle spasms, impaired flexibility, and pain.   ACTIVITY LIMITATIONS: lifting, standing, squatting, stairs, transfers, bathing, dressing, and locomotion level  PARTICIPATION LIMITATIONS: meal prep, cleaning, laundry, driving, shopping, community activity, and yard work  PERSONAL FACTORS: 1-2 comorbidities: Lt hip replacement, Rt knee replacement   are also affecting patient's functional outcome.   REHAB POTENTIAL: Good  CLINICAL DECISION MAKING: Stable/uncomplicated  EVALUATION COMPLEXITY: Moderate   GOALS: Goals reviewed with patient? Yes  SHORT TERM GOALS: Target date: 10/06/2023   Be independent in initial HEP Baseline: Goal status: Met on 09/21/23  2.  Improve Rt knee A/ROM flexion to > or = to 100 degrees to allow for sitting and steps without deviation Baseline: 100 A/ROM, 103 P/ROM (09/28/23) Goal status: MET  3.  Demonstrate symmetry with ambulation on level surface Baseline: no device with mild antalgia (09/28/23) Goal status: Ongoing  4.  Perform sit to stand without UE support due to improved knee and hip strength  Baseline:  Goal status: Met on 09/21/23   LONG TERM GOALS: Target date: 11/03/2023    Be  independent in advanced HEP Baseline:  Goal status: INITIAL  2.  Improve LEFS to > or = to 60/80 to allow for return to prior level of function Baseline: 28/80 Goal status: INITIAL  3.  Improve strength and ROM to ascend and descend steps with step-over step pattern and use of 1 rail Baseline:  Goal status: INITIAL  4.  Demonstrate Rt knee A/ROM flexion to > or = to 120 degrees to allow for squatting for gardening Baseline: 85 Goal status: INITIAL  5.  Stand and walk in the community without limitation due to pain or fatigue  Baseline:  Goal status: INITIAL    PLAN:  PT FREQUENCY: 2x/week  PT DURATION: 8 weeks  PLANNED INTERVENTIONS: 97110-Therapeutic exercises, 97530- Therapeutic activity, 97112- Neuromuscular re-education, 97535- Self Care, [id number]- Manual therapy, [id number]- Gait training, [phone]- Canalith repositioning, [id number]- Aquatic Therapy, 97014- Electrical stimulation (unattended), [id number]- Electrical stimulation (manual), 97016- Vasopneumatic device, [id number]- Ultrasound, [id number]- Ionotophoresis 4mg /ml Dexamethasone, Patient/Family education, Balance training, Taping, Dry Needling, Joint mobilization, Joint manipulation, Vestibular training, Cryotherapy, and Moist heat  PLAN FOR NEXT SESSION: edema management, gait, ROM, functional strength and mobility, progress quad strengthening and terminal knee extension   Lorrene Reid, PT 10/05/23 12:29 PM  [facility] [address], [address] [place], [place] [address] Phone # [phone] Fax [phone]

## 2023-10-08 ENCOUNTER — Ambulatory Visit: Payer: Medicare Other

## 2023-10-08 DIAGNOSIS — R262 Difficulty in walking, not elsewhere classified: Secondary | ICD-10-CM | POA: Diagnosis not present

## 2023-10-08 DIAGNOSIS — M6281 Muscle weakness (generalized): Secondary | ICD-10-CM

## 2023-10-08 DIAGNOSIS — M25561 Pain in right knee: Secondary | ICD-10-CM

## 2023-10-08 DIAGNOSIS — M25661 Stiffness of right knee, not elsewhere classified: Secondary | ICD-10-CM

## 2023-10-08 DIAGNOSIS — R2689 Other abnormalities of gait and mobility: Secondary | ICD-10-CM

## 2023-10-08 DIAGNOSIS — R600 Localized edema: Secondary | ICD-10-CM | POA: Diagnosis not present

## 2023-10-08 NOTE — Therapy (Signed)
 OUTPATIENT PHYSICAL THERAPY LOWER EXTREMITY TREATMENT   Patient Name: Susan Woodward MRN: [id number] DOB:[birth year], 68 y.o., female Today's Date: 10/08/2023  END OF SESSION:  PT End of Session - 10/08/23 1143     Visit Number 9    Date for PT Re-Evaluation 11/03/23    Authorization Type BCBS Medicare    Progress Note Due on Visit 10    PT Start Time 1102    PT Stop Time 1145    PT Time Calculation (min) 43 min    Activity Tolerance Patient tolerated treatment well    Behavior During Therapy WFL for tasks assessed/performed                   Past Medical History:  Diagnosis Date   Arthritis    GERD (gastroesophageal reflux disease)    Hypertension    Osteoporosis    PONV (postoperative nausea and vomiting)    Past Surgical History:  Procedure Laterality Date   BACK SURGERY  08/16/2020   L4-5 fusion by Dr. Lelon Perla   TOTAL HIP ARTHROPLASTY Left 01/02/2023   Procedure: LEFT TOTAL HIP ARTHROPLASTY ANTERIOR APPROACH;  Surgeon: Kathryne Hitch, MD;  Location: [facility];  Service: Orthopedics;  Laterality: Left;   TOTAL KNEE ARTHROPLASTY Right 08/21/2023   Procedure: RIGHT TOTAL KNEE ARTHROPLASTY;  Surgeon: Kathryne Hitch, MD;  Location: [facility];  Service: Orthopedics;  Laterality: Right;   WRIST SURGERY Right    12 years ago   Patient Active Problem List   Diagnosis Date Noted   Status post right knee replacement 08/21/2023   Status post total replacement of left hip 01/02/2023   Degenerative spondylolisthesis 08/26/2021   Spinal stenosis of lumbar region 07/13/2017    PCP: Soundra Pilon, FNP  REFERRING PROVIDER: Doneen Poisson, MD  REFERRING DIAG:  Diagnosis  [phone] (ICD-10-CM) - Status post right knee replacement    THERAPY DIAG:  Difficulty in walking, not elsewhere classified  Muscle weakness (generalized)  Other abnormalities of gait and mobility  Acute pain of right knee  Stiffness of right knee, not elsewhere  classified  Rationale for Evaluation and Treatment: Rehabilitation  ONSET DATE: surgery for TKA on 08/21/23, chronic OA   SUBJECTIVE:   SUBJECTIVE STATEMENT: I saw the MD and he said I am doing so well that I don't need to return for 3 months.   From MD visit on 09/03/23:  The patient is here today for first postoperative follow-up visit status post a right total knee replacement.  She is someone who does not tolerate pain medications at all so she is not really taking any.  She has been having home health therapy.   Her right calf is soft.  Her right knee has almost full extension and we can flex her to about 70 degrees.  Incision looks good.  Staples were removed and Steri-Strips applied.      PERTINENT HISTORY: GERD, HTN, osteoporosis, L4-5 fusion 08/2020, Lt Total hip replacement 01/02/23  PAIN: 10/05/23 Are you having pain? Yes: NPRS scale: 0/10 Pain location: Rt knee  Pain description: dull ache Aggravating factors: exercise, movement, walking for long periods  Relieving factors: ice, Tylenol   PRECAUTIONS: None  RED FLAGS: None   WEIGHT BEARING RESTRICTIONS: No  FALLS:  Has patient fallen in last 6 months? No  LIVING ENVIRONMENT: Lives with: lives with their spouse Lives in: House/apartment Stairs: Yes: External: 3 steps; none Has following equipment at home: Single point cane and Walker - 2  wheeled  OCCUPATION: retired   PLOF: Independent, Vocation/Vocational requirements: retired , and Leisure: yardwork, walking for exercise   PATIENT GOALS: return to prior level of function, reduce pain  NEXT MD VISIT: 10/01/23  OBJECTIVE:  Note: Objective measures were completed at Evaluation unless otherwise noted.  DIAGNOSTIC FINDINGS: NA  PATIENT SURVEYS:  09/08/23: LEFS 28/80=35%  COGNITION: Overall cognitive status: Within functional limits for tasks assessed     SENSATION: WFL  EDEMA:  Minimal post-op edema   MUSCLE LENGTH: Limited hamstring length due to  edema and post-op  POSTURE: No Significant postural limitations  PALPATION: Good patellar mobility on the Rt -medial/lateral.  Reduced mobility with superior/inferior.  Mild edema about the Rt knee joint.  Tension and tenderness over distal hamstring insertions.  Well healing surgical incision.    LOWER EXTREMITY ROM:  Passive ROM Right eval Right 2/14 Right 09/21/23 In sitting Right  Supine  09/28/23 Left eval  Hip flexion Bil hip flexibility is WFLs    Full  Hip extension       Hip abduction       Hip adduction       Hip internal rotation       Hip external rotation       Knee flexion 85 95 110 103   Knee extension 8 2 deg  09/15/23 18 Lacking 8   Ankle dorsiflexion       Ankle plantarflexion       Ankle inversion       Ankle eversion        (Blank rows = not tested)  LOWER EXTREMITY MMT:  MMT Right eval Left eval  Hip flexion 4 4+  Hip extension 4+ 4+  Hip abduction 4 4+  Hip adduction    Hip internal rotation 4+ 4+  Hip external rotation 4+ 4+  Knee flexion 4 4+  Knee extension 4+ 4+  Ankle dorsiflexion    Ankle plantarflexion    Ankle inversion    Ankle eversion     (Blank rows = not tested)  GAIT: Distance walked: 50 Assistive device utilized:  none Level of assistance: Modified independence Comments: reduced time on Rt with reduced step length on Rt.  Circumduction of Rt LE with stepping up on step with Rt                                                                                                                                TREATMENT DATE:   10/08/2023 NuStep: Level 5x 8 minutes- PT present to discuss progress  Rocker board x 3 min  Resisted walking: 10# x10 each  Sit to stand: 5# kettlebell 2x10 Long arc quads: 2.5# added 2x10 with 5" hold Step up and hold on balance pad x 10 fwd and x 10 lateral Leg press: seat 5: 60# bil 2x10, 30# Rt only 2x10 6" step down: bil UE support x10  10/05/2023 NuStep: Level 5x 8 minutes- PT  present to  discuss progress  Rocker board x 3 min  Resisted walking:  Sit to stand: 5# kettlebell 2x10 SLR on Rt with quad set 2x10 Step up and hold on balance pad x 10 fwd and x 10 lateral Leg press: seat 5: 60# bil 2x10, 30# Rt only 2x10 6" step up on Rt 2x10 4" step-down on Rt x10  10/01/2023 Bike: ROM x 8 min with hold at end range flexion Recumbent bike 6' PT present to discuss status (partial ROM with stretch at end range) Rocker board x 2 min (verbal and tactile cues to isolate ankle mobility) Right gastroc stretch x 10 holding 10 sec on rocker board propped at an angle on balance pad bilateral Sit to stand: 5# kettlebell 2x10 SLR on Rt with quad set 2x10 Step up and hold on balance pad x 10 fwd and x 10 lateral SLS on right LE on blue pod 10" hold x6 Leg press: seat 5: 55# bil 2x10, 30# Rt only 2x10 6" step up on Rt 2x10   PATIENT EDUCATION:  Education details: Access Code: [email or web address] Person educated: Patient Education method: Explanation, Demonstration, and Handouts Education comprehension: verbalized understanding and returned demonstration  HOME EXERCISE PROGRAM: Access Code: [email or web address] URL: [email or web address] Date: 09/11/2023 Prepared by: Loistine Simas Beuhring  Exercises - Seated Hamstring Stretch  - 3 x daily - 7 x weekly - 1 sets - 3 reps - 20 hold - Step Up  - 1-2 x daily - 7 x weekly - 1-2 sets - 10 reps - Gastroc Stretch on Wall  - 2 x daily - 7 x weekly - 1 sets - 3 reps - 20 hold - Supine Quad Set on Towel Roll  - 2 x daily - 7 x weekly - 2 sets - 10 reps - 5 hold - Seated Knee Flexion AAROM  - 3 x daily - 7 x weekly - 1 sets - 5 reps - 10 hold - Sit to Stand  - 2 x daily - 7 x weekly - 1-2 sets - 10 reps - Hooklying Clamshell with Resistance  - 1 x daily - 7 x weekly - 1-2 sets - 10 reps  ASSESSMENT:  CLINICAL IMPRESSION: Pt did a lot of work yesterday to clean her boat to get it ready for the season. She sat on the ground and was able to get up from the  ground without significant difficulty. She is also able to walk in the community without significant limitation including inclines and unlevel surfaces.   She did well with resisted walking today for improved proprioception.  PT monitored throughout session for technique and to monitor for pain. Patient continues to require skilled PT to progress towards goal related activities.  OBJECTIVE IMPAIRMENTS: Abnormal gait, decreased activity tolerance, decreased balance, decreased mobility, difficulty walking, decreased ROM, decreased strength, increased edema, increased fascial restrictions, increased muscle spasms, impaired flexibility, and pain.   ACTIVITY LIMITATIONS: lifting, standing, squatting, stairs, transfers, bathing, dressing, and locomotion level  PARTICIPATION LIMITATIONS: meal prep, cleaning, laundry, driving, shopping, community activity, and yard work  PERSONAL FACTORS: 1-2 comorbidities: Lt hip replacement, Rt knee replacement   are also affecting patient's functional outcome.   REHAB POTENTIAL: Good  CLINICAL DECISION MAKING: Stable/uncomplicated  EVALUATION COMPLEXITY: Moderate   GOALS: Goals reviewed with patient? Yes  SHORT TERM GOALS: Target date: 10/06/2023   Be independent in initial HEP Baseline: Goal status: Met on 09/21/23  2.  Improve Rt knee A/ROM flexion to > or = to 100 degrees to  allow for sitting and steps without deviation Baseline: 100 A/ROM, 103 P/ROM (09/28/23) Goal status: MET  3.  Demonstrate symmetry with ambulation on level surface Baseline: no device with mild antalgia (09/28/23) Goal status: Ongoing  4.  Perform sit to stand without UE support due to improved knee and hip strength  Baseline:  Goal status: Met on 09/21/23   LONG TERM GOALS: Target date: 11/03/2023    Be independent in advanced HEP Baseline:  Goal status: INITIAL  2.  Improve LEFS to > or = to 60/80 to allow for return to prior level of function Baseline: 28/80 Goal status:  INITIAL  3.  Improve strength and ROM to ascend and descend steps with step-over step pattern and use of 1 rail Baseline:  Goal status: INITIAL  4.  Demonstrate Rt knee A/ROM flexion to > or = to 120 degrees to allow for squatting for gardening Baseline: 103 Goal status: In progress   5.  Stand and walk in the community without limitation due to pain or fatigue  Baseline: able to walk all distances without significant limitation Goal status: MET    PLAN:  PT FREQUENCY: 2x/week  PT DURATION: 8 weeks  PLANNED INTERVENTIONS: 97110-Therapeutic exercises, 97530- Therapeutic activity, 97112- Neuromuscular re-education, 97535- Self Care, 86578- Manual therapy, [id number]- Gait training, [phone]- Canalith repositioning, [id number]- Aquatic Therapy, 97014- Electrical stimulation (unattended), [id number]- Electrical stimulation (manual), 97016- Vasopneumatic device, [id number]- Ultrasound, [id number]- Ionotophoresis 4mg /ml Dexamethasone, Patient/Family education, Balance training, Taping, Dry Needling, Joint mobilization, Joint manipulation, Vestibular training, Cryotherapy, and Moist heat  PLAN FOR NEXT SESSION: edema management, gait, ROM, functional strength and mobility, progress quad strengthening and terminal knee extension   Lorrene Reid, PT 10/08/23 11:44 AM  [facility] [address], [address] [place], [place] [address] Phone # [phone] Fax [phone]

## 2023-10-12 ENCOUNTER — Ambulatory Visit: Payer: Medicare Other

## 2023-10-12 DIAGNOSIS — R2689 Other abnormalities of gait and mobility: Secondary | ICD-10-CM | POA: Diagnosis not present

## 2023-10-12 DIAGNOSIS — M25661 Stiffness of right knee, not elsewhere classified: Secondary | ICD-10-CM

## 2023-10-12 DIAGNOSIS — R262 Difficulty in walking, not elsewhere classified: Secondary | ICD-10-CM

## 2023-10-12 DIAGNOSIS — M25561 Pain in right knee: Secondary | ICD-10-CM | POA: Diagnosis not present

## 2023-10-12 DIAGNOSIS — M6281 Muscle weakness (generalized): Secondary | ICD-10-CM

## 2023-10-12 DIAGNOSIS — N6321 Unspecified lump in the left breast, upper outer quadrant: Secondary | ICD-10-CM | POA: Diagnosis not present

## 2023-10-12 DIAGNOSIS — R600 Localized edema: Secondary | ICD-10-CM | POA: Diagnosis not present

## 2023-10-12 NOTE — Therapy (Signed)
 OUTPATIENT PHYSICAL THERAPY LOWER EXTREMITY TREATMENT   Patient Name: Susan Woodward MRN: [id number] DOB:01-19-1956, 68 y.o., female Today's Date: 10/12/2023 Progress Note Reporting Period 09/08/23 to 10/12/23  See note below for Objective Data and Assessment of Progress/Goals.     END OF SESSION:  PT End of Session - 10/12/23 1230     Visit Number 10    Date for PT Re-Evaluation 11/03/23    Authorization Type BCBS Medicare    Progress Note Due on Visit 20    PT Start Time 1148    PT Stop Time 1230    PT Time Calculation (min) 42 min    Activity Tolerance Patient tolerated treatment well    Behavior During Therapy WFL for tasks assessed/performed                    Past Medical History:  Diagnosis Date   Arthritis    GERD (gastroesophageal reflux disease)    Hypertension    Osteoporosis    PONV (postoperative nausea and vomiting)    Past Surgical History:  Procedure Laterality Date   BACK SURGERY  08/16/2020   L4-5 fusion by Dr. Lelon Perla   TOTAL HIP ARTHROPLASTY Left 01/02/2023   Procedure: LEFT TOTAL HIP ARTHROPLASTY ANTERIOR APPROACH;  Surgeon: Kathryne Hitch, MD;  Location: [facility];  Service: Orthopedics;  Laterality: Left;   TOTAL KNEE ARTHROPLASTY Right 08/21/2023   Procedure: RIGHT TOTAL KNEE ARTHROPLASTY;  Surgeon: Kathryne Hitch, MD;  Location: [facility];  Service: Orthopedics;  Laterality: Right;   WRIST SURGERY Right    12 years ago   Patient Active Problem List   Diagnosis Date Noted   Status post right knee replacement 08/21/2023   Status post total replacement of left hip 01/02/2023   Degenerative spondylolisthesis 08/26/2021   Spinal stenosis of lumbar region 07/13/2017    PCP: Soundra Pilon, FNP  REFERRING PROVIDER: Doneen Poisson, MD  REFERRING DIAG:  Diagnosis  [phone] (ICD-10-CM) - Status post right knee replacement    THERAPY DIAG:  Difficulty in walking, not elsewhere classified  Muscle weakness  (generalized)  Other abnormalities of gait and mobility  Acute pain of right knee  Stiffness of right knee, not elsewhere classified  Rationale for Evaluation and Treatment: Rehabilitation  ONSET DATE: surgery for TKA on 08/21/23, chronic OA   SUBJECTIVE:   SUBJECTIVE STATEMENT: Doing well.    From MD visit on 09/03/23:  The patient is here today for first postoperative follow-up visit status post a right total knee replacement.  She is someone who does not tolerate pain medications at all so she is not really taking any.  She has been having home health therapy.   Her right calf is soft.  Her right knee has almost full extension and we can flex her to about 70 degrees.  Incision looks good.  Staples were removed and Steri-Strips applied.      PERTINENT HISTORY: GERD, HTN, osteoporosis, L4-5 fusion 08/2020, Lt Total hip replacement 01/02/23  PAIN: 10/05/23 Are you having pain? Yes: NPRS scale: 0/10 Pain location: Rt knee  Pain description: dull ache Aggravating factors: exercise, movement, walking for long periods  Relieving factors: ice, Tylenol   PRECAUTIONS: None  RED FLAGS: None   WEIGHT BEARING RESTRICTIONS: No  FALLS:  Has patient fallen in last 6 months? No  LIVING ENVIRONMENT: Lives with: lives with their spouse Lives in: House/apartment Stairs: Yes: External: 3 steps; none Has following equipment at home: Single point cane  and Walker - 2 wheeled  OCCUPATION: retired   PLOF: Independent, Vocation/Vocational requirements: retired , and Leisure: yardwork, walking for exercise   PATIENT GOALS: return to prior level of function, reduce pain  NEXT MD VISIT: 10/01/23  OBJECTIVE:  Note: Objective measures were completed at Evaluation unless otherwise noted.  DIAGNOSTIC FINDINGS: NA  PATIENT SURVEYS:  09/08/23: LEFS 28/80=35% 10/12/23: LEFS 60/80=75%  COGNITION: Overall cognitive status: Within functional limits for tasks  assessed     SENSATION: WFL  EDEMA:  Minimal post-op edema   MUSCLE LENGTH: Limited hamstring length due to edema and post-op  POSTURE: No Significant postural limitations  PALPATION: Good patellar mobility on the Rt -medial/lateral.  Reduced mobility with superior/inferior.  Mild edema about the Rt knee joint.  Tension and tenderness over distal hamstring insertions.  Well healing surgical incision.    LOWER EXTREMITY ROM:  Passive ROM Right eval Right 2/14 Right 09/21/23 In sitting Right  Supine  09/28/23 Left eval  Hip flexion Bil hip flexibility is WFLs    Full  Hip extension       Hip abduction       Hip adduction       Hip internal rotation       Hip external rotation       Knee flexion 85 95 110 103   Knee extension 8 2 deg  09/15/23 18 Lacking 8   Ankle dorsiflexion       Ankle plantarflexion       Ankle inversion       Ankle eversion        (Blank rows = not tested)  LOWER EXTREMITY MMT:  MMT Right eval Left eval  Hip flexion 4 4+  Hip extension 4+ 4+  Hip abduction 4 4+  Hip adduction    Hip internal rotation 4+ 4+  Hip external rotation 4+ 4+  Knee flexion 4 4+  Knee extension 4+ 4+  Ankle dorsiflexion    Ankle plantarflexion    Ankle inversion    Ankle eversion     (Blank rows = not tested)  GAIT: Distance walked: 50 Assistive device utilized:  none Level of assistance: Modified independence Comments: reduced time on Rt with reduced step length on Rt.  Circumduction of Rt LE with stepping up on step with Rt                                                                                                                                TREATMENT DATE:   10/12/2023 NuStep: Level 5x 8 minutes- PT present to discuss progress  Rocker board x 3 min  Resisted walking: 10# x10 each  Sit to stand: 5# kettlebell-parallel stance, staggered stance x10 bil  Long arc quads: 2.5# added 2x10 with 5" hold Step up and hold on Bosu Ball on Rt x20 Leg  press: seat 5: 65# bil 2x10, 35# Rt only 2x10 6" step down: bil UE support  x10  10/08/2023 NuStep: Level 5x 8 minutes- PT present to discuss progress  Rocker board x 3 min  Resisted walking: 10# x10 each  Sit to stand: 5# kettlebell 2x10 Long arc quads: 2.5# added 2x10 with 5" hold Step up and hold on balance pad x 10 fwd and x 10 lateral Leg press: seat 5: 60# bil 2x10, 30# Rt only 2x10 6" step down: bil UE support x10  10/05/2023 NuStep: Level 5x 8 minutes- PT present to discuss progress  Rocker board x 3 min  Resisted walking:  Sit to stand: 5# kettlebell 2x10 SLR on Rt with quad set 2x10 Step up and hold on balance pad x 10 fwd and x 10 lateral Leg press: seat 5: 60# bil 2x10, 30# Rt only 2x10 6" step up on Rt 2x10 4" step-down on Rt x10  PATIENT EDUCATION:  Education details: Access Code: [email or web address] Person educated: Patient Education method: Explanation, Demonstration, and Handouts Education comprehension: verbalized understanding and returned demonstration  HOME EXERCISE PROGRAM: Access Code: [email or web address] URL: [email or web address] Date: 09/11/2023 Prepared by: Loistine Simas Beuhring  Exercises - Seated Hamstring Stretch  - 3 x daily - 7 x weekly - 1 sets - 3 reps - 20 hold - Step Up  - 1-2 x daily - 7 x weekly - 1-2 sets - 10 reps - Gastroc Stretch on Wall  - 2 x daily - 7 x weekly - 1 sets - 3 reps - 20 hold - Supine Quad Set on Towel Roll  - 2 x daily - 7 x weekly - 2 sets - 10 reps - 5 hold - Seated Knee Flexion AAROM  - 3 x daily - 7 x weekly - 1 sets - 5 reps - 10 hold - Sit to Stand  - 2 x daily - 7 x weekly - 1-2 sets - 10 reps - Hooklying Clamshell with Resistance  - 1 x daily - 7 x weekly - 1-2 sets - 10 reps  ASSESSMENT:  CLINICAL IMPRESSION: Pt is making steady progress after TKA.  She is walking all distances without device.  Pt is able to do more on her feet with less pain or fatigue. PT monitored throughout session for technique and to monitor for  pain. LEFS is improved to 75%. Rt knee is limited with flexion A/ROM and eccentric control with descending steps.  All appropriate for this stage of post-op rehab. Patient continues to require skilled PT to progress towards goal related activities.  OBJECTIVE IMPAIRMENTS: Abnormal gait, decreased activity tolerance, decreased balance, decreased mobility, difficulty walking, decreased ROM, decreased strength, increased edema, increased fascial restrictions, increased muscle spasms, impaired flexibility, and pain.   ACTIVITY LIMITATIONS: lifting, standing, squatting, stairs, transfers, bathing, dressing, and locomotion level  PARTICIPATION LIMITATIONS: meal prep, cleaning, laundry, driving, shopping, community activity, and yard work  PERSONAL FACTORS: 1-2 comorbidities: Lt hip replacement, Rt knee replacement   are also affecting patient's functional outcome.   REHAB POTENTIAL: Good  CLINICAL DECISION MAKING: Stable/uncomplicated  EVALUATION COMPLEXITY: Moderate   GOALS: Goals reviewed with patient? Yes  SHORT TERM GOALS: Target date: 10/06/2023   Be independent in initial HEP Baseline: Goal status: Met on 09/21/23  2.  Improve Rt knee A/ROM flexion to > or = to 100 degrees to allow for sitting and steps without deviation Baseline: 100 A/ROM, 103 P/ROM (09/28/23) Goal status: MET  3.  Demonstrate symmetry with ambulation on level surface Baseline: no device with mild antalgia (09/28/23) Goal status: Ongoing  4.  Perform sit to stand  without UE support due to improved knee and hip strength  Baseline:  Goal status: Met on 09/21/23   LONG TERM GOALS: Target date: 11/03/2023    Be independent in advanced HEP Baseline:  Goal status: INITIAL  2.  Improve LEFS to > or = to 60/80 to allow for return to prior level of function Baseline: 60/80=75% (10/12/23) Goal status: MET  3.  Improve strength and ROM to ascend and descend steps with step-over step pattern and use of 1 rail Baseline:  difficulty with descending steps (10/12/23) Goal status: in progress   4.  Demonstrate Rt knee A/ROM flexion to > or = to 120 degrees to allow for squatting for gardening Baseline: 103 Goal status: In progress   5.  Stand and walk in the community without limitation due to pain or fatigue  Baseline: able to walk all distances without significant limitation Goal status: MET    PLAN:  PT FREQUENCY: 2x/week  PT DURATION: 8 weeks  PLANNED INTERVENTIONS: 97110-Therapeutic exercises, 97530- Therapeutic activity, 97112- Neuromuscular re-education, 97535- Self Care, 42706- Manual therapy, [id number]- Gait training, [id number]- Canalith repositioning, [id number]- Aquatic Therapy, 97014- Electrical stimulation (unattended), [id number]- Electrical stimulation (manual), 97016- Vasopneumatic device, [id number]- Ultrasound, [id number]- Ionotophoresis 4mg /ml Dexamethasone, Patient/Family education, Balance training, Taping, Dry Needling, Joint mobilization, Joint manipulation, Vestibular training, Cryotherapy, and Moist heat  PLAN FOR NEXT SESSION: edema management, gait, ROM, functional strength and mobility, progress quad strengthening and terminal knee extension   Lorrene Reid, PT 10/12/23 12:32 PM  [facility] [address] [place], [place] [address] Phone # [phone] Fax [phone]

## 2023-10-15 ENCOUNTER — Ambulatory Visit: Payer: Medicare Other

## 2023-10-15 DIAGNOSIS — M25561 Pain in right knee: Secondary | ICD-10-CM

## 2023-10-15 DIAGNOSIS — R600 Localized edema: Secondary | ICD-10-CM

## 2023-10-15 DIAGNOSIS — R2689 Other abnormalities of gait and mobility: Secondary | ICD-10-CM

## 2023-10-15 DIAGNOSIS — R262 Difficulty in walking, not elsewhere classified: Secondary | ICD-10-CM | POA: Diagnosis not present

## 2023-10-15 DIAGNOSIS — M25661 Stiffness of right knee, not elsewhere classified: Secondary | ICD-10-CM

## 2023-10-15 DIAGNOSIS — M6281 Muscle weakness (generalized): Secondary | ICD-10-CM | POA: Diagnosis not present

## 2023-10-15 NOTE — Therapy (Signed)
 OUTPATIENT PHYSICAL THERAPY LOWER EXTREMITY TREATMENT   Patient Name: Susan Woodward MRN: [id number] DOB:29-Oct-1955, 68 y.o., female Today's Date: 10/15/2023    END OF SESSION:  PT End of Session - 10/15/23 1228     Visit Number 11    Date for PT Re-Evaluation 11/03/23    Authorization Type BCBS Medicare    Progress Note Due on Visit 20    PT Start Time 1146    PT Stop Time 1228    PT Time Calculation (min) 42 min    Activity Tolerance Patient tolerated treatment well    Behavior During Therapy WFL for tasks assessed/performed                     Past Medical History:  Diagnosis Date   Arthritis    GERD (gastroesophageal reflux disease)    Hypertension    Osteoporosis    PONV (postoperative nausea and vomiting)    Past Surgical History:  Procedure Laterality Date   BACK SURGERY  08/16/2020   L4-5 fusion by Dr. Lelon Perla   TOTAL HIP ARTHROPLASTY Left 01/02/2023   Procedure: LEFT TOTAL HIP ARTHROPLASTY ANTERIOR APPROACH;  Surgeon: Kathryne Hitch, MD;  Location: [facility];  Service: Orthopedics;  Laterality: Left;   TOTAL KNEE ARTHROPLASTY Right 08/21/2023   Procedure: RIGHT TOTAL KNEE ARTHROPLASTY;  Surgeon: Kathryne Hitch, MD;  Location: [facility];  Service: Orthopedics;  Laterality: Right;   WRIST SURGERY Right    12 years ago   Patient Active Problem List   Diagnosis Date Noted   Status post right knee replacement 08/21/2023   Status post total replacement of left hip 01/02/2023   Degenerative spondylolisthesis 08/26/2021   Spinal stenosis of lumbar region 07/13/2017    PCP: Soundra Pilon, FNP  REFERRING PROVIDER: Doneen Poisson, MD  REFERRING DIAG:  Diagnosis  [phone] (ICD-10-CM) - Status post right knee replacement    THERAPY DIAG:  Difficulty in walking, not elsewhere classified  Muscle weakness (generalized)  Other abnormalities of gait and mobility  Acute pain of right knee  Stiffness of right knee, not elsewhere  classified  Localized edema  Rationale for Evaluation and Treatment: Rehabilitation  ONSET DATE: surgery for TKA on 08/21/23, chronic OA   SUBJECTIVE:   SUBJECTIVE STATEMENT: I walked 3/4 of a mile twice since I was here.  I think that might have been too much because I was sore after.   From MD visit on 09/03/23:  The patient is here today for first postoperative follow-up visit status post a right total knee replacement.  She is someone who does not tolerate pain medications at all so she is not really taking any.  She has been having home health therapy.   Her right calf is soft.  Her right knee has almost full extension and we can flex her to about 70 degrees.  Incision looks good.  Staples were removed and Steri-Strips applied.      PERTINENT HISTORY: GERD, HTN, osteoporosis, L4-5 fusion 08/2020, Lt Total hip replacement 01/02/23  PAIN: 10/15/23 Are you having pain? Yes: NPRS scale: 0/10 Pain location: Rt knee  Pain description: dull ache Aggravating factors: exercise, movement, walking for long periods  Relieving factors: ice, Tylenol   PRECAUTIONS: None  RED FLAGS: None   WEIGHT BEARING RESTRICTIONS: No  FALLS:  Has patient fallen in last 6 months? No  LIVING ENVIRONMENT: Lives with: lives with their spouse Lives in: House/apartment Stairs: Yes: External: 3 steps; none Has  following equipment at home: Single point cane and Walker - 2 wheeled  OCCUPATION: retired   PLOF: Independent, Vocation/Vocational requirements: retired , and Leisure: yardwork, walking for exercise   PATIENT GOALS: return to prior level of function, reduce pain  NEXT MD VISIT: 10/01/23  OBJECTIVE:  Note: Objective measures were completed at Evaluation unless otherwise noted.  DIAGNOSTIC FINDINGS: NA  PATIENT SURVEYS:  09/08/23: LEFS 28/80=35% 10/12/23: LEFS 60/80=75%  COGNITION: Overall cognitive status: Within functional limits for tasks assessed     SENSATION: WFL  EDEMA:   Minimal post-op edema   MUSCLE LENGTH: Limited hamstring length due to edema and post-op  POSTURE: No Significant postural limitations  PALPATION: Good patellar mobility on the Rt -medial/lateral.  Reduced mobility with superior/inferior.  Mild edema about the Rt knee joint.  Tension and tenderness over distal hamstring insertions.  Well healing surgical incision.    LOWER EXTREMITY ROM:  Passive ROM Right eval Right 2/14 Right 09/21/23 In sitting Right  Supine  09/28/23 Left eval  Hip flexion Bil hip flexibility is WFLs    Full  Hip extension       Hip abduction       Hip adduction       Hip internal rotation       Hip external rotation       Knee flexion 85 95 110 103   Knee extension 8 2 deg  09/15/23 18 Lacking 8   Ankle dorsiflexion       Ankle plantarflexion       Ankle inversion       Ankle eversion        (Blank rows = not tested)  LOWER EXTREMITY MMT:  MMT Right eval Left eval  Hip flexion 4 4+  Hip extension 4+ 4+  Hip abduction 4 4+  Hip adduction    Hip internal rotation 4+ 4+  Hip external rotation 4+ 4+  Knee flexion 4 4+  Knee extension 4+ 4+  Ankle dorsiflexion    Ankle plantarflexion    Ankle inversion    Ankle eversion     (Blank rows = not tested)  GAIT: Distance walked: 50 Assistive device utilized:  none Level of assistance: Modified independence Comments: reduced time on Rt with reduced step length on Rt.  Circumduction of Rt LE with stepping up on step with Rt                                                                                                                                TREATMENT DATE:   10/15/2023 NuStep: Level 5x 8 minutes- PT present to discuss progress  Rocker board x 3 min  Resisted walking: 10# x10 each  Sit to stand: 5# kettlebell-parallel stance, staggered stance x10 bil  Long arc quads: 3# added 2x10 with 5" hold Step up and hold on Bosu Ball on Rt x20 Leg press: seat 5: 65# bil 2x10, 35# Rt only  2x10 Lateral lunge on bosu 2x10 on Rt  10/12/2023 NuStep: Level 5x 8 minutes- PT present to discuss progress  Rocker board x 3 min  Resisted walking: 10# x10 each  Sit to stand: 5# kettlebell-parallel stance, staggered stance x10 bil  Long arc quads: 2.5# added 2x10 with 5" hold Step up and hold on Bosu Ball on Rt x20 Leg press: seat 5: 65# bil 2x10, 35# Rt only 2x10 6" step down: bil UE support x10  10/08/2023 NuStep: Level 5x 8 minutes- PT present to discuss progress  Rocker board x 3 min  Resisted walking: 10# x10 each  Sit to stand: 5# kettlebell 2x10 Long arc quads: 2.5# added 2x10 with 5" hold Step up and hold on balance pad x 10 fwd and x 10 lateral Leg press: seat 5: 60# bil 2x10, 30# Rt only 2x10 6" step down: bil UE support x10   PATIENT EDUCATION:  Education details: Access Code: [email or web address] Person educated: Patient Education method: Explanation, Demonstration, and Handouts Education comprehension: verbalized understanding and returned demonstration  HOME EXERCISE PROGRAM: Access Code: [email or web address] URL: [email or web address] Date: 09/11/2023 Prepared by: Loistine Simas Beuhring  Exercises - Seated Hamstring Stretch  - 3 x daily - 7 x weekly - 1 sets - 3 reps - 20 hold - Step Up  - 1-2 x daily - 7 x weekly - 1-2 sets - 10 reps - Gastroc Stretch on Wall  - 2 x daily - 7 x weekly - 1 sets - 3 reps - 20 hold - Supine Quad Set on Towel Roll  - 2 x daily - 7 x weekly - 2 sets - 10 reps - 5 hold - Seated Knee Flexion AAROM  - 3 x daily - 7 x weekly - 1 sets - 5 reps - 10 hold - Sit to Stand  - 2 x daily - 7 x weekly - 1-2 sets - 10 reps - Hooklying Clamshell with Resistance  - 1 x daily - 7 x weekly - 1-2 sets - 10 reps  ASSESSMENT:  CLINICAL IMPRESSION: Pt is making steady progress after TKA.  She has been walking 3/4 of a mile at home and was sore after this.  She plans to scale back her distance and see if this helps with the discomfort.  PT monitored throughout  session for technique and to monitor for pain. LEFS is improved to 75% this week. Rt knee is limited with flexion A/ROM and eccentric control with descending steps.  All appropriate for this stage of post-op rehab. Patient continues to require skilled PT to progress towards goal related activities.  OBJECTIVE IMPAIRMENTS: Abnormal gait, decreased activity tolerance, decreased balance, decreased mobility, difficulty walking, decreased ROM, decreased strength, increased edema, increased fascial restrictions, increased muscle spasms, impaired flexibility, and pain.   ACTIVITY LIMITATIONS: lifting, standing, squatting, stairs, transfers, bathing, dressing, and locomotion level  PARTICIPATION LIMITATIONS: meal prep, cleaning, laundry, driving, shopping, community activity, and yard work  PERSONAL FACTORS: 1-2 comorbidities: Lt hip replacement, Rt knee replacement   are also affecting patient's functional outcome.   REHAB POTENTIAL: Good  CLINICAL DECISION MAKING: Stable/uncomplicated  EVALUATION COMPLEXITY: Moderate   GOALS: Goals reviewed with patient? Yes  SHORT TERM GOALS: Target date: 10/06/2023   Be independent in initial HEP Baseline: Goal status: Met on 09/21/23  2.  Improve Rt knee A/ROM flexion to > or = to 100 degrees to allow for sitting and steps without deviation Baseline: 100 A/ROM, 103 P/ROM (09/28/23) Goal status: MET  3.  Demonstrate symmetry  with ambulation on level surface Baseline: no device with mild antalgia (09/28/23) Goal status: Ongoing  4.  Perform sit to stand without UE support due to improved knee and hip strength  Baseline:  Goal status: Met on 09/21/23   LONG TERM GOALS: Target date: 11/03/2023    Be independent in advanced HEP Baseline:  Goal status: INITIAL  2.  Improve LEFS to > or = to 60/80 to allow for return to prior level of function Baseline: 60/80=75% (10/12/23) Goal status: MET  3.  Improve strength and ROM to ascend and descend steps with  step-over step pattern and use of 1 rail Baseline: difficulty with descending steps (10/12/23) Goal status: in progress   4.  Demonstrate Rt knee A/ROM flexion to > or = to 120 degrees to allow for squatting for gardening Baseline: 103 Goal status: In progress   5.  Stand and walk in the community without limitation due to pain or fatigue  Baseline: able to walk all distances without significant limitation Goal status: MET    PLAN:  PT FREQUENCY: 2x/week  PT DURATION: 8 weeks  PLANNED INTERVENTIONS: 97110-Therapeutic exercises, 97530- Therapeutic activity, 97112- Neuromuscular re-education, 97535- Self Care, [id number]- Manual therapy, [id number]- Gait training, [phone]- Canalith repositioning, [id number]- Aquatic Therapy, 97014- Electrical stimulation (unattended), [id number]- Electrical stimulation (manual), 97016- Vasopneumatic device, [id number]- Ultrasound, [id number]- Ionotophoresis 4mg /ml Dexamethasone, Patient/Family education, Balance training, Taping, Dry Needling, Joint mobilization, Joint manipulation, Vestibular training, Cryotherapy, and Moist heat  PLAN FOR NEXT SESSION: edema management, gait, ROM, functional strength and mobility, progress quad strengthening and terminal knee extension   Lorrene Reid, PT 10/15/23 12:29 PM  [facility] [address], [address] [place], [place] [address] Phone # [phone] Fax [phone]

## 2023-10-19 ENCOUNTER — Telehealth: Payer: Self-pay

## 2023-10-19 ENCOUNTER — Ambulatory Visit: Payer: Medicare Other

## 2023-10-19 NOTE — Telephone Encounter (Signed)
 Called pt due to no-show for her 11:00 appt.  She thought her appt was at 11:45.  She will come and wait to see if there is an opening at 11:45 with one of our therapists.  If not, she will cancel.

## 2023-10-21 DIAGNOSIS — R92322 Mammographic fibroglandular density, left breast: Secondary | ICD-10-CM | POA: Diagnosis not present

## 2023-10-21 DIAGNOSIS — N6321 Unspecified lump in the left breast, upper outer quadrant: Secondary | ICD-10-CM | POA: Diagnosis not present

## 2023-10-22 ENCOUNTER — Ambulatory Visit: Payer: Medicare Other

## 2023-10-22 DIAGNOSIS — R262 Difficulty in walking, not elsewhere classified: Secondary | ICD-10-CM | POA: Diagnosis not present

## 2023-10-22 DIAGNOSIS — R2689 Other abnormalities of gait and mobility: Secondary | ICD-10-CM | POA: Diagnosis not present

## 2023-10-22 DIAGNOSIS — M25561 Pain in right knee: Secondary | ICD-10-CM | POA: Diagnosis not present

## 2023-10-22 DIAGNOSIS — M6281 Muscle weakness (generalized): Secondary | ICD-10-CM

## 2023-10-22 DIAGNOSIS — M25661 Stiffness of right knee, not elsewhere classified: Secondary | ICD-10-CM

## 2023-10-22 DIAGNOSIS — R600 Localized edema: Secondary | ICD-10-CM | POA: Diagnosis not present

## 2023-10-22 NOTE — Therapy (Signed)
 OUTPATIENT PHYSICAL THERAPY LOWER EXTREMITY TREATMENT   Patient Name: Susan Woodward MRN: [id number] DOB:06-Mar-1956, 68 y.o., female Today's Date: 10/22/2023    END OF SESSION:  PT End of Session - 10/22/23 1231     Visit Number 12    Date for PT Re-Evaluation 11/03/23    Authorization Type BCBS Medicare    Progress Note Due on Visit 20    PT Start Time 1147    PT Stop Time 1231    PT Time Calculation (min) 44 min    Activity Tolerance Patient tolerated treatment well    Behavior During Therapy WFL for tasks assessed/performed                      Past Medical History:  Diagnosis Date   Arthritis    GERD (gastroesophageal reflux disease)    Hypertension    Osteoporosis    PONV (postoperative nausea and vomiting)    Past Surgical History:  Procedure Laterality Date   BACK SURGERY  08/16/2020   L4-5 fusion by Dr. Lelon Perla   TOTAL HIP ARTHROPLASTY Left 01/02/2023   Procedure: LEFT TOTAL HIP ARTHROPLASTY ANTERIOR APPROACH;  Surgeon: Kathryne Hitch, MD;  Location: [facility];  Service: Orthopedics;  Laterality: Left;   TOTAL KNEE ARTHROPLASTY Right 08/21/2023   Procedure: RIGHT TOTAL KNEE ARTHROPLASTY;  Surgeon: Kathryne Hitch, MD;  Location: [facility];  Service: Orthopedics;  Laterality: Right;   WRIST SURGERY Right    12 years ago   Patient Active Problem List   Diagnosis Date Noted   Status post right knee replacement 08/21/2023   Status post total replacement of left hip 01/02/2023   Degenerative spondylolisthesis 08/26/2021   Spinal stenosis of lumbar region 07/13/2017    PCP: Soundra Pilon, FNP  REFERRING PROVIDER: Doneen Poisson, MD  REFERRING DIAG:  Diagnosis  [phone] (ICD-10-CM) - Status post right knee replacement    THERAPY DIAG:  Difficulty in walking, not elsewhere classified  Muscle weakness (generalized)  Other abnormalities of gait and mobility  Stiffness of right knee, not elsewhere classified  Acute pain  of right knee  Rationale for Evaluation and Treatment: Rehabilitation  ONSET DATE: surgery for TKA on 08/21/23, chronic OA   SUBJECTIVE:   SUBJECTIVE STATEMENT: I did work at [employer]  and I have been walking 1 mile at a time.    From MD visit on 09/03/23:  The patient is here today for first postoperative follow-up visit status post a right total knee replacement.  She is someone who does not tolerate pain medications at all so she is not really taking any.  She has been having home health therapy.   Her right calf is soft.  Her right knee has almost full extension and we can flex her to about 70 degrees.  Incision looks good.  Staples were removed and Steri-Strips applied.      PERTINENT HISTORY: GERD, HTN, osteoporosis, L4-5 fusion 08/2020, Lt Total hip replacement 01/02/23  PAIN: 10/15/23 Are you having pain? Yes: NPRS scale: 0/10 Pain location: Rt knee  Pain description: dull ache Aggravating factors: exercise, movement, walking for long periods  Relieving factors: ice, Tylenol   PRECAUTIONS: None  RED FLAGS: None   WEIGHT BEARING RESTRICTIONS: No  FALLS:  Has patient fallen in last 6 months? No  LIVING ENVIRONMENT: Lives with: lives with their spouse Lives in: House/apartment Stairs: Yes: External: 3 steps; none Has following equipment at home: Single point cane and Walker -  2 wheeled  OCCUPATION: retired   PLOF: Independent, Vocation/Vocational requirements: retired , and Leisure: yardwork, walking for exercise   PATIENT GOALS: return to prior level of function, reduce pain  NEXT MD VISIT: 10/01/23  OBJECTIVE:  Note: Objective measures were completed at Evaluation unless otherwise noted.  DIAGNOSTIC FINDINGS: NA  PATIENT SURVEYS:  09/08/23: LEFS 28/80=35% 10/12/23: LEFS 60/80=75%  COGNITION: Overall cognitive status: Within functional limits for tasks assessed     SENSATION: WFL  EDEMA:  Minimal post-op edema   MUSCLE LENGTH: Limited hamstring  length due to edema and post-op  POSTURE: No Significant postural limitations  PALPATION: Good patellar mobility on the Rt -medial/lateral.  Reduced mobility with superior/inferior.  Mild edema about the Rt knee joint.  Tension and tenderness over distal hamstring insertions.  Well healing surgical incision.    LOWER EXTREMITY ROM:  Passive ROM Right eval Right 2/14 Right 09/21/23 In sitting Right  Supine  09/28/23 Left eval  Hip flexion Bil hip flexibility is WFLs    Full  Hip extension       Hip abduction       Hip adduction       Hip internal rotation       Hip external rotation       Knee flexion 85 95 110 103   Knee extension 8 2 deg  09/15/23 18 Lacking 8   Ankle dorsiflexion       Ankle plantarflexion       Ankle inversion       Ankle eversion        (Blank rows = not tested)  LOWER EXTREMITY MMT:  MMT Right eval Left eval  Hip flexion 4 4+  Hip extension 4+ 4+  Hip abduction 4 4+  Hip adduction    Hip internal rotation 4+ 4+  Hip external rotation 4+ 4+  Knee flexion 4 4+  Knee extension 4+ 4+  Ankle dorsiflexion    Ankle plantarflexion    Ankle inversion    Ankle eversion     (Blank rows = not tested)  GAIT: Distance walked: 50 Assistive device utilized:  none Level of assistance: Modified independence Comments: reduced time on Rt with reduced step length on Rt.  Circumduction of Rt LE with stepping up on step with Rt                                                                                                                                TREATMENT DATE:   10/22/2023 NuStep: Level 5x 8 minutes- PT present to discuss progress  Rocker board x 3 min  Resisted walking: 10# x10 each  6" step down: Lt eccentric 2x10 Sit to stand: 10# kettlebell-parallel stance, staggered stance x10 bil  Long arc quads: 3# added 2x10 with 5" hold Lunge on Bosu on Rt  with single Bosu pole x20 Leg press: seat 5: 65# bil 2x10, 35# Rt only  2x10  10/15/2023 NuStep: Level 5x 8 minutes- PT present to discuss progress  Rocker board x 3 min  Resisted walking: 10# x10 each  Sit to stand: 5# kettlebell-parallel stance, staggered stance x10 bil  Long arc quads: 3# added 2x10 with 5" hold Step up and hold on Bosu Ball on Rt x20 Leg press: seat 5: 65# bil 2x10, 35# Rt only 2x10 Lateral lunge on bosu 2x10 on Rt  10/12/2023 NuStep: Level 5x 8 minutes- PT present to discuss progress  Rocker board x 3 min  Resisted walking: 10# x10 each  Sit to stand: 5# kettlebell-parallel stance, staggered stance x10 bil  Long arc quads: 2.5# added 2x10 with 5" hold Step up and hold on Bosu Ball on Rt x20 Leg press: seat 5: 65# bil 2x10, 35# Rt only 2x10 6" step down: bil UE support x10  10/08/2023 NuStep: Level 5x 8 minutes- PT present to discuss progress  Rocker board x 3 min  Resisted walking: 10# x10 each  Sit to stand: 5# kettlebell 2x10 Long arc quads: 2.5# added 2x10 with 5" hold Step up and hold on balance pad x 10 fwd and x 10 lateral Leg press: seat 5: 60# bil 2x10, 30# Rt only 2x10 6" step down: bil UE support x10   PATIENT EDUCATION:  Education details: Access Code: [email or web address] Person educated: Patient Education method: Explanation, Demonstration, and Handouts Education comprehension: verbalized understanding and returned demonstration  HOME EXERCISE PROGRAM: Access Code: [email or web address] URL: [email or web address] Date: 09/11/2023 Prepared by: Loistine Simas Beuhring  Exercises - Seated Hamstring Stretch  - 3 x daily - 7 x weekly - 1 sets - 3 reps - 20 hold - Step Up  - 1-2 x daily - 7 x weekly - 1-2 sets - 10 reps - Gastroc Stretch on Wall  - 2 x daily - 7 x weekly - 1 sets - 3 reps - 20 hold - Supine Quad Set on Towel Roll  - 2 x daily - 7 x weekly - 2 sets - 10 reps - 5 hold - Seated Knee Flexion AAROM  - 3 x daily - 7 x weekly - 1 sets - 5 reps - 10 hold - Sit to Stand  - 2 x daily - 7 x weekly - 1-2 sets - 10 reps -  Hooklying Clamshell with Resistance  - 1 x daily - 7 x weekly - 1-2 sets - 10 reps  ASSESSMENT:  CLINICAL IMPRESSION: Pt is making steady progress after TKA.  She was able to work on her house and walk ~1 mile without limitation due to knee pain.  She advanced to 10# kettlebell with sit to stand exercises.  PT monitored throughout session for technique and to monitor for pain. Rt knee is limited with flexion A/ROM and eccentric control with descending steps.  Patient continues to require skilled PT to progress towards goal related activities.  OBJECTIVE IMPAIRMENTS: Abnormal gait, decreased activity tolerance, decreased balance, decreased mobility, difficulty walking, decreased ROM, decreased strength, increased edema, increased fascial restrictions, increased muscle spasms, impaired flexibility, and pain.   ACTIVITY LIMITATIONS: lifting, standing, squatting, stairs, transfers, bathing, dressing, and locomotion level  PARTICIPATION LIMITATIONS: meal prep, cleaning, laundry, driving, shopping, community activity, and yard work  PERSONAL FACTORS: 1-2 comorbidities: Lt hip replacement, Rt knee replacement   are also affecting patient's functional outcome.   REHAB POTENTIAL: Good  CLINICAL DECISION MAKING: Stable/uncomplicated  EVALUATION COMPLEXITY: Moderate   GOALS: Goals reviewed with patient? Yes  SHORT TERM GOALS: Target date: 10/06/2023   Be  independent in initial HEP Baseline: Goal status: Met on 09/21/23  2.  Improve Rt knee A/ROM flexion to > or = to 100 degrees to allow for sitting and steps without deviation Baseline: 100 A/ROM, 103 P/ROM (09/28/23) Goal status: MET  3.  Demonstrate symmetry with ambulation on level surface Baseline: no device with mild antalgia (09/28/23) Goal status: Ongoing  4.  Perform sit to stand without UE support due to improved knee and hip strength  Baseline:  Goal status: Met on 09/21/23   LONG TERM GOALS: Target date: 11/03/2023    Be  independent in advanced HEP Baseline:  Goal status: INITIAL  2.  Improve LEFS to > or = to 60/80 to allow for return to prior level of function Baseline: 60/80=75% (10/12/23) Goal status: MET  3.  Improve strength and ROM to ascend and descend steps with step-over step pattern and use of 1 rail Baseline: difficulty with descending steps (10/12/23) Goal status: in progress   4.  Demonstrate Rt knee A/ROM flexion to > or = to 120 degrees to allow for squatting for gardening Baseline: 103 Goal status: In progress   5.  Stand and walk in the community without limitation due to pain or fatigue  Baseline: able to walk all distances without significant limitation Goal status: MET    PLAN:  PT FREQUENCY: 2x/week  PT DURATION: 8 weeks  PLANNED INTERVENTIONS: 97110-Therapeutic exercises, 97530- Therapeutic activity, 97112- Neuromuscular re-education, 97535- Self Care, 16109- Manual therapy, [id number]- Gait training, [phone]- Canalith repositioning, [id number]- Aquatic Therapy, 97014- Electrical stimulation (unattended), [id number]- Electrical stimulation (manual), 97016- Vasopneumatic device, [id number]- Ultrasound, [id number]- Ionotophoresis 4mg /ml Dexamethasone, Patient/Family education, Balance training, Taping, Dry Needling, Joint mobilization, Joint manipulation, Vestibular training, Cryotherapy, and Moist heat  PLAN FOR NEXT SESSION: edema management, gait, ROM, functional strength and mobility, progress quad strengthening and terminal knee extension   Lorrene Reid, PT 10/22/23 12:33 PM  [facility] [address] [place], [place] [address] Phone # [phone] Fax [phone]

## 2023-10-26 ENCOUNTER — Ambulatory Visit: Payer: Medicare Other

## 2023-10-26 DIAGNOSIS — R262 Difficulty in walking, not elsewhere classified: Secondary | ICD-10-CM | POA: Diagnosis not present

## 2023-10-26 DIAGNOSIS — M25661 Stiffness of right knee, not elsewhere classified: Secondary | ICD-10-CM | POA: Diagnosis not present

## 2023-10-26 DIAGNOSIS — M6281 Muscle weakness (generalized): Secondary | ICD-10-CM | POA: Diagnosis not present

## 2023-10-26 DIAGNOSIS — M25561 Pain in right knee: Secondary | ICD-10-CM

## 2023-10-26 DIAGNOSIS — R2689 Other abnormalities of gait and mobility: Secondary | ICD-10-CM | POA: Diagnosis not present

## 2023-10-26 DIAGNOSIS — R600 Localized edema: Secondary | ICD-10-CM | POA: Diagnosis not present

## 2023-10-26 NOTE — Therapy (Signed)
 OUTPATIENT PHYSICAL THERAPY LOWER EXTREMITY TREATMENT   Patient Name: Susan Woodward MRN: [id number] DOB:20-Jan-1956, 68 y.o., female Today's Date: 10/26/2023    END OF SESSION:  PT End of Session - 10/26/23 1234     Visit Number 13    Date for PT Re-Evaluation 11/03/23    Authorization Type BCBS Medicare    Progress Note Due on Visit 20    PT Start Time 1147    PT Stop Time 1230    PT Time Calculation (min) 43 min    Activity Tolerance Patient tolerated treatment well    Behavior During Therapy WFL for tasks assessed/performed                       Past Medical History:  Diagnosis Date   Arthritis    GERD (gastroesophageal reflux disease)    Hypertension    Osteoporosis    PONV (postoperative nausea and vomiting)    Past Surgical History:  Procedure Laterality Date   BACK SURGERY  08/16/2020   L4-5 fusion by Dr. Lelon Perla   TOTAL HIP ARTHROPLASTY Left 01/02/2023   Procedure: LEFT TOTAL HIP ARTHROPLASTY ANTERIOR APPROACH;  Surgeon: Kathryne Hitch, MD;  Location: [facility];  Service: Orthopedics;  Laterality: Left;   TOTAL KNEE ARTHROPLASTY Right 08/21/2023   Procedure: RIGHT TOTAL KNEE ARTHROPLASTY;  Surgeon: Kathryne Hitch, MD;  Location: [facility];  Service: Orthopedics;  Laterality: Right;   WRIST SURGERY Right    12 years ago   Patient Active Problem List   Diagnosis Date Noted   Status post right knee replacement 08/21/2023   Status post total replacement of left hip 01/02/2023   Degenerative spondylolisthesis 08/26/2021   Spinal stenosis of lumbar region 07/13/2017    PCP: Soundra Pilon, FNP  REFERRING PROVIDER: Doneen Poisson, MD  REFERRING DIAG:  Diagnosis  [phone] (ICD-10-CM) - Status post right knee replacement    THERAPY DIAG:  Difficulty in walking, not elsewhere classified  Muscle weakness (generalized)  Other abnormalities of gait and mobility  Stiffness of right knee, not elsewhere classified  Acute  pain of right knee  Rationale for Evaluation and Treatment: Rehabilitation  ONSET DATE: surgery for TKA on 08/21/23, chronic OA   SUBJECTIVE:   SUBJECTIVE STATEMENT: I'm walking 1 mile consistently now.  No issues or pain.  I feel like my knee isn't bending much more than it was.    From MD visit on 09/03/23:  The patient is here today for first postoperative follow-up visit status post a right total knee replacement.  She is someone who does not tolerate pain medications at all so she is not really taking any.  She has been having home health therapy.   Her right calf is soft.  Her right knee has almost full extension and we can flex her to about 70 degrees.  Incision looks good.  Staples were removed and Steri-Strips applied.      PERTINENT HISTORY: GERD, HTN, osteoporosis, L4-5 fusion 08/2020, Lt Total hip replacement 01/02/23  PAIN: 10/15/23 Are you having pain? Yes: NPRS scale: 0/10 Pain location: Rt knee  Pain description: dull ache Aggravating factors: exercise, movement, walking for long periods  Relieving factors: ice, Tylenol   PRECAUTIONS: None  RED FLAGS: None   WEIGHT BEARING RESTRICTIONS: No  FALLS:  Has patient fallen in last 6 months? No  LIVING ENVIRONMENT: Lives with: lives with their spouse Lives in: House/apartment Stairs: Yes: External: 3 steps; none Has following  equipment at home: Single point cane and Walker - 2 wheeled  OCCUPATION: retired   PLOF: Independent, Vocation/Vocational requirements: retired , and Leisure: yardwork, walking for exercise   PATIENT GOALS: return to prior level of function, reduce pain  NEXT MD VISIT: 10/01/23  OBJECTIVE:  Note: Objective measures were completed at Evaluation unless otherwise noted.  DIAGNOSTIC FINDINGS: NA  PATIENT SURVEYS:  09/08/23: LEFS 28/80=35% 10/12/23: LEFS 60/80=75%  COGNITION: Overall cognitive status: Within functional limits for tasks assessed     SENSATION: WFL  EDEMA:  Minimal  post-op edema   MUSCLE LENGTH: Limited hamstring length due to edema and post-op  POSTURE: No Significant postural limitations  PALPATION: Good patellar mobility on the Rt -medial/lateral.  Reduced mobility with superior/inferior.  Mild edema about the Rt knee joint.  Tension and tenderness over distal hamstring insertions.  Well healing surgical incision.    LOWER EXTREMITY ROM:  Passive ROM Right eval Right 2/14 Right 09/21/23 In sitting Right  Supine  09/28/23 Left eval  Hip flexion Bil hip flexibility is WFLs    Full  Hip extension       Hip abduction       Hip adduction       Hip internal rotation       Hip external rotation       Knee flexion 85 95 110 103   Knee extension 8 2 deg  09/15/23 18 Lacking 8   Ankle dorsiflexion       Ankle plantarflexion       Ankle inversion       Ankle eversion        (Blank rows = not tested)  LOWER EXTREMITY MMT:  MMT Right eval Left eval  Hip flexion 4 4+  Hip extension 4+ 4+  Hip abduction 4 4+  Hip adduction    Hip internal rotation 4+ 4+  Hip external rotation 4+ 4+  Knee flexion 4 4+  Knee extension 4+ 4+  Ankle dorsiflexion    Ankle plantarflexion    Ankle inversion    Ankle eversion     (Blank rows = not tested)  GAIT: Distance walked: 50 Assistive device utilized:  none Level of assistance: Modified independence Comments: reduced time on Rt with reduced step length on Rt.  Circumduction of Rt LE with stepping up on step with Rt                                                                                                                                TREATMENT DATE:   10/26/2023 NuStep: Level 5x 8 minutes- PT present to discuss progress  Rocker board x 3 min  Heel slides with strap: x10 -105 degrees flexion  Sit to stand: 10# kettlebell-parallel stance, staggered stance x10 bil  Long arc quads: 3# added 2x10 with 5" hold Lunge on Bosu on Rt  without UE support 2x10, lateral lunge 2x10 without UE  support Leg press:  seat 5: 65# bil 2x10, 35# Rt only 2x10  10/22/2023 NuStep: Level 5x 8 minutes- PT present to discuss progress  Rocker board x 3 min  Resisted walking: 10# x10 each  6" step down: Lt eccentric 2x10 Sit to stand: 10# kettlebell-parallel stance, staggered stance x10 bil  Long arc quads: 3# added 2x10 with 5" hold Lunge on Bosu on Rt  with single Bosu pole x20 Leg press: seat 5: 65# bil 2x10, 35# Rt only 2x10   10/15/2023 NuStep: Level 5x 8 minutes- PT present to discuss progress  Rocker board x 3 min  Resisted walking: 10# x10 each  Sit to stand: 5# kettlebell-parallel stance, staggered stance x10 bil  Long arc quads: 3# added 2x10 with 5" hold Step up and hold on Bosu Ball on Rt x20 Leg press: seat 5: 65# bil 2x10, 35# Rt only 2x10 Lateral lunge on bosu 2x10 on Rt    PATIENT EDUCATION:  Education details: Access Code: [email or web address] Person educated: Patient Education method: Explanation, Demonstration, and Handouts Education comprehension: verbalized understanding and returned demonstration  HOME EXERCISE PROGRAM: Access Code: [email or web address] URL: [email or web address] Date: 09/11/2023 Prepared by: Loistine Simas Beuhring  Exercises - Seated Hamstring Stretch  - 3 x daily - 7 x weekly - 1 sets - 3 reps - 20 hold - Step Up  - 1-2 x daily - 7 x weekly - 1-2 sets - 10 reps - Gastroc Stretch on Wall  - 2 x daily - 7 x weekly - 1 sets - 3 reps - 20 hold - Supine Quad Set on Towel Roll  - 2 x daily - 7 x weekly - 2 sets - 10 reps - 5 hold - Seated Knee Flexion AAROM  - 3 x daily - 7 x weekly - 1 sets - 5 reps - 10 hold - Sit to Stand  - 2 x daily - 7 x weekly - 1-2 sets - 10 reps - Hooklying Clamshell with Resistance  - 1 x daily - 7 x weekly - 1-2 sets - 10 reps  ASSESSMENT:  CLINICAL IMPRESSION: Pt is making steady progress after TKA.  She is consistently walking 1 mile without any limitation due to pain or endurance.   She is doing well with proprioceptive  tasks.   PT monitored throughout session for technique and to monitor for pain. Rt knee is limited with flexion A/ROM and eccentric control with descending steps. She has plateaued with Rt knee A/ROM flexion.  Pt encouraged to stretch regularly to maximize this. She continues to work on knee ROM at home consistently.  Patient continues to require skilled PT to progress towards goal related activities.  OBJECTIVE IMPAIRMENTS: Abnormal gait, decreased activity tolerance, decreased balance, decreased mobility, difficulty walking, decreased ROM, decreased strength, increased edema, increased fascial restrictions, increased muscle spasms, impaired flexibility, and pain.   ACTIVITY LIMITATIONS: lifting, standing, squatting, stairs, transfers, bathing, dressing, and locomotion level  PARTICIPATION LIMITATIONS: meal prep, cleaning, laundry, driving, shopping, community activity, and yard work  PERSONAL FACTORS: 1-2 comorbidities: Lt hip replacement, Rt knee replacement   are also affecting patient's functional outcome.   REHAB POTENTIAL: Good  CLINICAL DECISION MAKING: Stable/uncomplicated  EVALUATION COMPLEXITY: Moderate   GOALS: Goals reviewed with patient? Yes  SHORT TERM GOALS: Target date: 10/06/2023   Be independent in initial HEP Baseline: Goal status: Met on 09/21/23  2.  Improve Rt knee A/ROM flexion to > or = to 100 degrees to allow for sitting and steps without deviation Baseline: 100 A/ROM, 103 P/ROM (  09/28/23) Goal status: MET  3.  Demonstrate symmetry with ambulation on level surface Baseline: no device with mild antalgia (09/28/23) Goal status: Ongoing  4.  Perform sit to stand without UE support due to improved knee and hip strength  Baseline:  Goal status: Met on 09/21/23   LONG TERM GOALS: Target date: 11/03/2023    Be independent in advanced HEP Baseline:  Goal status: INITIAL  2.  Improve LEFS to > or = to 60/80 to allow for return to prior level of  function Baseline: 60/80=75% (10/12/23) Goal status: MET  3.  Improve strength and ROM to ascend and descend steps with step-over step pattern and use of 1 rail Baseline: difficulty with descending steps (10/12/23) Goal status: in progress   4.  Demonstrate Rt knee A/ROM flexion to > or = to 120 degrees to allow for squatting for gardening Baseline: 105 (10/26/23) Goal status: In progress   5.  Stand and walk in the community without limitation due to pain or fatigue  Baseline: able to walk all distances without significant limitation Goal status: MET    PLAN:  PT FREQUENCY: 2x/week  PT DURATION: 8 weeks  PLANNED INTERVENTIONS: 97110-Therapeutic exercises, 97530- Therapeutic activity, 97112- Neuromuscular re-education, 97535- Self Care, 16109- Manual therapy, [id number]- Gait training, [phone]- Canalith repositioning, [id number]- Aquatic Therapy, 97014- Electrical stimulation (unattended), [id number]- Electrical stimulation (manual), 97016- Vasopneumatic device, [id number]- Ultrasound, [id number]- Ionotophoresis 4mg /ml Dexamethasone, Patient/Family education, Balance training, Taping, Dry Needling, Joint mobilization, Joint manipulation, Vestibular training, Cryotherapy, and Moist heat  PLAN FOR NEXT SESSION: edema management, gait, ROM, functional strength and mobility, progress quad strengthening and terminal knee extension   Lorrene Reid, PT 10/26/23 12:51 PM  [facility] [address] [place], [place] [address] Phone # [phone] Fax [phone]

## 2023-10-29 ENCOUNTER — Ambulatory Visit: Payer: Medicare Other | Attending: Orthopaedic Surgery

## 2023-10-29 DIAGNOSIS — M6281 Muscle weakness (generalized): Secondary | ICD-10-CM | POA: Diagnosis not present

## 2023-10-29 DIAGNOSIS — R262 Difficulty in walking, not elsewhere classified: Secondary | ICD-10-CM | POA: Diagnosis not present

## 2023-10-29 DIAGNOSIS — M25561 Pain in right knee: Secondary | ICD-10-CM | POA: Insufficient documentation

## 2023-10-29 DIAGNOSIS — R2689 Other abnormalities of gait and mobility: Secondary | ICD-10-CM | POA: Insufficient documentation

## 2023-10-29 DIAGNOSIS — M25661 Stiffness of right knee, not elsewhere classified: Secondary | ICD-10-CM | POA: Insufficient documentation

## 2023-10-29 NOTE — Therapy (Signed)
 OUTPATIENT PHYSICAL THERAPY LOWER EXTREMITY TREATMENT   Patient Name: Susan Woodward MRN: [id number] DOB:[birth year], 68 y.o., female Today's Date: 10/29/2023    END OF SESSION:  PT End of Session - 10/29/23 1227     Visit Number 14    Date for PT Re-Evaluation 11/03/23    Authorization Type BCBS Medicare    Progress Note Due on Visit 20    PT Start Time 1146    PT Stop Time 1225    PT Time Calculation (min) 39 min    Activity Tolerance Patient tolerated treatment well    Behavior During Therapy WFL for tasks assessed/performed                        Past Medical History:  Diagnosis Date   Arthritis    GERD (gastroesophageal reflux disease)    Hypertension    Osteoporosis    PONV (postoperative nausea and vomiting)    Past Surgical History:  Procedure Laterality Date   BACK SURGERY  08/16/2020   L4-5 fusion by Dr. Lelon Perla   TOTAL HIP ARTHROPLASTY Left 01/02/2023   Procedure: LEFT TOTAL HIP ARTHROPLASTY ANTERIOR APPROACH;  Surgeon: Kathryne Hitch, MD;  Location: [facility];  Service: Orthopedics;  Laterality: Left;   TOTAL KNEE ARTHROPLASTY Right 08/21/2023   Procedure: RIGHT TOTAL KNEE ARTHROPLASTY;  Surgeon: Kathryne Hitch, MD;  Location: [facility];  Service: Orthopedics;  Laterality: Right;   WRIST SURGERY Right    12 years ago   Patient Active Problem List   Diagnosis Date Noted   Status post right knee replacement 08/21/2023   Status post total replacement of left hip 01/02/2023   Degenerative spondylolisthesis 08/26/2021   Spinal stenosis of lumbar region 07/13/2017    PCP: Soundra Pilon, FNP  REFERRING PROVIDER: Doneen Poisson, MD  REFERRING DIAG:  Diagnosis  [phone] (ICD-10-CM) - Status post right knee replacement    THERAPY DIAG:  Difficulty in walking, not elsewhere classified  Muscle weakness (generalized)  Other abnormalities of gait and mobility  Stiffness of right knee, not elsewhere classified  Acute  pain of right knee  Rationale for Evaluation and Treatment: Rehabilitation  ONSET DATE: surgery for TKA on 08/21/23, chronic OA   SUBJECTIVE:   SUBJECTIVE STATEMENT: I'm walking 1 mile consistently now.  No issues or pain.  I feel like my knee isn't bending much more than it was.    From MD visit on 09/03/23:  The patient is here today for first postoperative follow-up visit status post a right total knee replacement.  She is someone who does not tolerate pain medications at all so she is not really taking any.  She has been having home health therapy.   Her right calf is soft.  Her right knee has almost full extension and we can flex her to about 70 degrees.  Incision looks good.  Staples were removed and Steri-Strips applied.      PERTINENT HISTORY: GERD, HTN, osteoporosis, L4-5 fusion 08/2020, Lt Total hip replacement 01/02/23  PAIN: 10/15/23 Are you having pain? Yes: NPRS scale: 0/10 Pain location: Rt knee  Pain description: dull ache Aggravating factors: exercise, movement, walking for long periods  Relieving factors: ice, Tylenol   PRECAUTIONS: None  RED FLAGS: None   WEIGHT BEARING RESTRICTIONS: No  FALLS:  Has patient fallen in last 6 months? No  LIVING ENVIRONMENT: Lives with: lives with their spouse Lives in: House/apartment Stairs: Yes: External: 3 steps; none Has  following equipment at home: Single point cane and Walker - 2 wheeled  OCCUPATION: retired   PLOF: Independent, Vocation/Vocational requirements: retired , and Leisure: yardwork, walking for exercise   PATIENT GOALS: return to prior level of function, reduce pain  NEXT MD VISIT: 10/01/23  OBJECTIVE:  Note: Objective measures were completed at Evaluation unless otherwise noted.  DIAGNOSTIC FINDINGS: NA  PATIENT SURVEYS:  09/08/23: LEFS 28/80=35% 10/12/23: LEFS 60/80=75%  COGNITION: Overall cognitive status: Within functional limits for tasks assessed     SENSATION: WFL  EDEMA:  Minimal  post-op edema   MUSCLE LENGTH: Limited hamstring length due to edema and post-op  POSTURE: No Significant postural limitations  PALPATION: Good patellar mobility on the Rt -medial/lateral.  Reduced mobility with superior/inferior.  Mild edema about the Rt knee joint.  Tension and tenderness over distal hamstring insertions.  Well healing surgical incision.    LOWER EXTREMITY ROM:  Passive ROM Right eval Right 2/14 Right 09/21/23 In sitting Right  Supine  09/28/23 Left eval  Hip flexion Bil hip flexibility is WFLs    Full  Hip extension       Hip abduction       Hip adduction       Hip internal rotation       Hip external rotation       Knee flexion 85 95 110 103   Knee extension 8 2 deg  09/15/23 18 Lacking 8   Ankle dorsiflexion       Ankle plantarflexion       Ankle inversion       Ankle eversion        (Blank rows = not tested)  LOWER EXTREMITY MMT:  MMT Right eval Left eval  Hip flexion 4 4+  Hip extension 4+ 4+  Hip abduction 4 4+  Hip adduction    Hip internal rotation 4+ 4+  Hip external rotation 4+ 4+  Knee flexion 4 4+  Knee extension 4+ 4+  Ankle dorsiflexion    Ankle plantarflexion    Ankle inversion    Ankle eversion     (Blank rows = not tested)  GAIT: Distance walked: 50 Assistive device utilized:  none Level of assistance: Modified independence Comments: reduced time on Rt with reduced step length on Rt.  Circumduction of Rt LE with stepping up on step with Rt                                                                                                                                TREATMENT DATE:   10/29/2023 NuStep: Level 5x 8 minutes- PT present to discuss progress  Rocker board x 3 min  Heel slides with strap: x10 -105 degrees flexion  Sit to stand: 10# kettlebell-parallel stance, staggered stance x10 bil  Long arc quads: 3# added 2x10 with 5" hold Lunge on Bosu on Rt  without UE support 2x10, lateral lunge 2x10 without UE  support 6"  step down: Rt x15 Leg press: seat 5: 70# bil 2x10, 40# Rt only 2x10  10/26/2023 NuStep: Level 5x 8 minutes- PT present to discuss progress  Rocker board x 3 min  Heel slides with strap: x10 -105 degrees flexion  Sit to stand: 10# kettlebell-parallel stance, staggered stance x10 bil  Long arc quads: 3# added 2x10 with 5" hold Lunge on Bosu on Rt  without UE support 2x10, lateral lunge 2x10 without UE support Leg press: seat 5: 65# bil 2x10, 35# Rt only 2x10  10/22/2023 NuStep: Level 5x 8 minutes- PT present to discuss progress  Rocker board x 3 min  Resisted walking: 10# x10 each  6" step down: Lt eccentric 2x10 Sit to stand: 10# kettlebell-parallel stance, staggered stance x10 bil  Long arc quads: 3# added 2x10 with 5" hold Lunge on Bosu on Rt  with single Bosu pole x20 Leg press: seat 5: 65# bil 2x10, 35# Rt only 2x10   PATIENT EDUCATION:  Education details: Access Code: [email or web address] Person educated: Patient Education method: Explanation, Demonstration, and Handouts Education comprehension: verbalized understanding and returned demonstration  HOME EXERCISE PROGRAM: Access Code: [email or web address] URL: [email or web address] Date: 09/11/2023 Prepared by: Loistine Simas Beuhring  Exercises - Seated Hamstring Stretch  - 3 x daily - 7 x weekly - 1 sets - 3 reps - 20 hold - Step Up  - 1-2 x daily - 7 x weekly - 1-2 sets - 10 reps - Gastroc Stretch on Wall  - 2 x daily - 7 x weekly - 1 sets - 3 reps - 20 hold - Supine Quad Set on Towel Roll  - 2 x daily - 7 x weekly - 2 sets - 10 reps - 5 hold - Seated Knee Flexion AAROM  - 3 x daily - 7 x weekly - 1 sets - 5 reps - 10 hold - Sit to Stand  - 2 x daily - 7 x weekly - 1-2 sets - 10 reps - Hooklying Clamshell with Resistance  - 1 x daily - 7 x weekly - 1-2 sets - 10 reps  ASSESSMENT:  CLINICAL IMPRESSION: Pt is making steady progress after TKA.  She has been squatting more frequently and reports that this is getting easier   She is doing well with proprioceptive tasks.   PT monitored throughout session for technique and to monitor for pain. She increased weight on leg press.   She has plateaued with Rt knee A/ROM flexion.  Pt encouraged to stretch regularly to maximize this. Improved technique and control with step down on Rt. She continues to work on knee ROM at home consistently.  Patient continues to require skilled PT to progress towards goal related activities.  OBJECTIVE IMPAIRMENTS: Abnormal gait, decreased activity tolerance, decreased balance, decreased mobility, difficulty walking, decreased ROM, decreased strength, increased edema, increased fascial restrictions, increased muscle spasms, impaired flexibility, and pain.   ACTIVITY LIMITATIONS: lifting, standing, squatting, stairs, transfers, bathing, dressing, and locomotion level  PARTICIPATION LIMITATIONS: meal prep, cleaning, laundry, driving, shopping, community activity, and yard work  PERSONAL FACTORS: 1-2 comorbidities: Lt hip replacement, Rt knee replacement   are also affecting patient's functional outcome.   REHAB POTENTIAL: Good  CLINICAL DECISION MAKING: Stable/uncomplicated  EVALUATION COMPLEXITY: Moderate   GOALS: Goals reviewed with patient? Yes  SHORT TERM GOALS: Target date: 10/06/2023   Be independent in initial HEP Baseline: Goal status: Met on 09/21/23  2.  Improve Rt knee A/ROM flexion to > or = to 100 degrees to allow for sitting and  steps without deviation Baseline: 100 A/ROM, 103 P/ROM (09/28/23) Goal status: MET  3.  Demonstrate symmetry with ambulation on level surface Baseline: no device with mild antalgia (09/28/23) Goal status: Ongoing  4.  Perform sit to stand without UE support due to improved knee and hip strength  Baseline:  Goal status: Met on 09/21/23   LONG TERM GOALS: Target date: 11/03/2023    Be independent in advanced HEP Baseline:  Goal status: INITIAL  2.  Improve LEFS to > or = to 60/80 to allow  for return to prior level of function Baseline: 60/80=75% (10/12/23) Goal status: MET  3.  Improve strength and ROM to ascend and descend steps with step-over step pattern and use of 1 rail Baseline: difficulty with descending steps (10/12/23) Goal status: in progress   4.  Demonstrate Rt knee A/ROM flexion to > or = to 120 degrees to allow for squatting for gardening Baseline: 105 (10/26/23) Goal status: In progress   5.  Stand and walk in the community without limitation due to pain or fatigue  Baseline: able to walk all distances without significant limitation Goal status: MET    PLAN:  PT FREQUENCY: 2x/week  PT DURATION: 8 weeks  PLANNED INTERVENTIONS: 97110-Therapeutic exercises, 97530- Therapeutic activity, [id number]- Neuromuscular re-education, 97535- Self Care, 16109- Manual therapy, [id number]- Gait training, [id number]- Canalith repositioning, [id number]- Aquatic Therapy, 97014- Electrical stimulation (unattended), [id number]- Electrical stimulation (manual), 97016- Vasopneumatic device, [id number]- Ultrasound, [id number]- Ionotophoresis 4mg /ml Dexamethasone, Patient/Family education, Balance training, Taping, Dry Needling, Joint mobilization, Joint manipulation, Vestibular training, Cryotherapy, and Moist heat  PLAN FOR NEXT SESSION: ERO with probable D/C next session   Lorrene Reid, PT 10/29/23 12:28 PM  [facility] [address] [place], [place] [address] Phone # [phone] Fax [phone]

## 2023-11-02 ENCOUNTER — Ambulatory Visit: Payer: Medicare Other

## 2023-11-02 DIAGNOSIS — M25661 Stiffness of right knee, not elsewhere classified: Secondary | ICD-10-CM | POA: Diagnosis not present

## 2023-11-02 DIAGNOSIS — M6281 Muscle weakness (generalized): Secondary | ICD-10-CM

## 2023-11-02 DIAGNOSIS — R262 Difficulty in walking, not elsewhere classified: Secondary | ICD-10-CM

## 2023-11-02 DIAGNOSIS — M25561 Pain in right knee: Secondary | ICD-10-CM | POA: Diagnosis not present

## 2023-11-02 DIAGNOSIS — R2689 Other abnormalities of gait and mobility: Secondary | ICD-10-CM

## 2023-11-02 NOTE — Therapy (Signed)
 OUTPATIENT PHYSICAL THERAPY LOWER EXTREMITY TREATMENT   Patient Name: Susan Woodward MRN: [id number] DOB:07-27-1956, 68 y.o., female Today's Date: 11/02/2023    END OF SESSION:  PT End of Session - 11/02/23 1320     Visit Number 15    Authorization Type [employer] Medicare    Progress Note Due on Visit 20    PT Start Time 1146    PT Stop Time 1226    PT Time Calculation (min) 40 min    Activity Tolerance Patient tolerated treatment well    Behavior During Therapy WFL for tasks assessed/performed                         Past Medical History:  Diagnosis Date   Arthritis    GERD (gastroesophageal reflux disease)    Hypertension    Osteoporosis    PONV (postoperative nausea and vomiting)    Past Surgical History:  Procedure Laterality Date   BACK SURGERY  08/16/2020   L4-5 fusion by Dr. Lelon Perla   TOTAL HIP ARTHROPLASTY Left 01/02/2023   Procedure: LEFT TOTAL HIP ARTHROPLASTY ANTERIOR APPROACH;  Surgeon: Kathryne Hitch, MD;  Location: [facility];  Service: Orthopedics;  Laterality: Left;   TOTAL KNEE ARTHROPLASTY Right 08/21/2023   Procedure: RIGHT TOTAL KNEE ARTHROPLASTY;  Surgeon: Kathryne Hitch, MD;  Location: [facility];  Service: Orthopedics;  Laterality: Right;   WRIST SURGERY Right    12 years ago   Patient Active Problem List   Diagnosis Date Noted   Status post right knee replacement 08/21/2023   Status post total replacement of left hip 01/02/2023   Degenerative spondylolisthesis 08/26/2021   Spinal stenosis of lumbar region 07/13/2017    PCP: Soundra Pilon, FNP  REFERRING PROVIDER: Doneen Poisson, MD  REFERRING DIAG:  Diagnosis  [phone] (ICD-10-CM) - Status post right knee replacement    THERAPY DIAG:  Difficulty in walking, not elsewhere classified  Muscle weakness (generalized)  Other abnormalities of gait and mobility  Stiffness of right knee, not elsewhere classified  Rationale for Evaluation and Treatment:  Rehabilitation  ONSET DATE: surgery for TKA on 08/21/23, chronic OA   SUBJECTIVE:   SUBJECTIVE STATEMENT: I'm walking 1 mile consistently now.  No issues or pain.  I feel like my knee isn't bending much more than it was.    From MD visit on 09/03/23:  The patient is here today for first postoperative follow-up visit status post a right total knee replacement.  She is someone who does not tolerate pain medications at all so she is not really taking any.  She has been having home health therapy.   Her right calf is soft.  Her right knee has almost full extension and we can flex her to about 70 degrees.  Incision looks good.  Staples were removed and Steri-Strips applied.      PERTINENT HISTORY: GERD, HTN, osteoporosis, L4-5 fusion 08/2020, Lt Total hip replacement 01/02/23  PAIN: 10/15/23 Are you having pain? Yes: NPRS scale: 0/10 Pain location: Rt knee  Pain description: dull ache Aggravating factors: exercise, movement, walking for long periods  Relieving factors: ice, Tylenol   PRECAUTIONS: None  RED FLAGS: None   WEIGHT BEARING RESTRICTIONS: No  FALLS:  Has patient fallen in last 6 months? No  LIVING ENVIRONMENT: Lives with: lives with their spouse Lives in: House/apartment Stairs: Yes: External: 3 steps; none Has following equipment at home: Single point cane and Walker - 2 wheeled  OCCUPATION: retired   PLOF: Independent, Vocation/Vocational requirements: retired , and Leisure: yardwork, walking for exercise   PATIENT GOALS: return to prior level of function, reduce pain  NEXT MD VISIT: 10/01/23  OBJECTIVE:  Note: Objective measures were completed at Evaluation unless otherwise noted.  DIAGNOSTIC FINDINGS: NA  PATIENT SURVEYS:  09/08/23: LEFS 28/80=35% 10/12/23: LEFS 60/80=75%  COGNITION: Overall cognitive status: Within functional limits for tasks assessed     SENSATION: WFL  EDEMA:  Minimal post-op edema   MUSCLE LENGTH: Limited hamstring length due to  edema and post-op  POSTURE: No Significant postural limitations  PALPATION: Good patellar mobility on the Rt -medial/lateral.  Reduced mobility with superior/inferior.  Mild edema about the Rt knee joint.  Tension and tenderness over distal hamstring insertions.  Well healing surgical incision.    LOWER EXTREMITY ROM:  Passive ROM Right eval Right 2/14 Right 09/21/23 In sitting Right  Supine  09/28/23 Right  Supine  11/02/23 Left eval  Hip flexion Bil hip flexibility is WFLs     Full  Hip extension        Hip abduction        Hip adduction        Hip internal rotation        Hip external rotation        Knee flexion 85 95 110 103 110   Knee extension 8 2 deg  09/15/23 18 Lacking 8    Ankle dorsiflexion        Ankle plantarflexion        Ankle inversion        Ankle eversion         (Blank rows = not tested)  LOWER EXTREMITY MMT:  MMT Right eval Left eval  Hip flexion 4 4+  Hip extension 4+ 4+  Hip abduction 4 4+  Hip adduction    Hip internal rotation 4+ 4+  Hip external rotation 4+ 4+  Knee flexion 4 4+  Knee extension 4+ 4+  Ankle dorsiflexion    Ankle plantarflexion    Ankle inversion    Ankle eversion     (Blank rows = not tested)  GAIT: Distance walked: 50 Assistive device utilized:  none Level of assistance: Modified independence Comments: reduced time on Rt with reduced step length on Rt.  Circumduction of Rt LE with stepping up on step with Rt                                                                                                                                TREATMENT DATE:   10/29/2023 NuStep: Level 5x 8 minutes- PT present to discuss progress  Rocker board x 3 min  Heel slides with strap: x10 -105 degrees flexion  Sit to stand: 10# kettlebell-parallel stance, staggered stance x10 bil  Long arc quads: 3# added 2x10 with 5" hold Lunge on Bosu on Rt  without UE support 2x10 Resisted walking: 10# 4  ways x10 each Leg press: seat 5: 70# bil  2x10, 40# Rt only 2x10  10/26/2023 NuStep: Level 5x 8 minutes- PT present to discuss progress  Rocker board x 3 min  Heel slides with strap: x10 -105 degrees flexion  Sit to stand: 10# kettlebell-parallel stance, staggered stance x10 bil  Long arc quads: 3# added 2x10 with 5" hold Lunge on Bosu on Rt  without UE support 2x10, lateral lunge 2x10 without UE support Leg press: seat 5: 65# bil 2x10, 35# Rt only 2x10  10/22/2023 NuStep: Level 5x 8 minutes- PT present to discuss progress  Rocker board x 3 min  Resisted walking: 10# x10 each  6" step down: Lt eccentric 2x10 Sit to stand: 10# kettlebell-parallel stance, staggered stance x10 bil  Long arc quads: 3# added 2x10 with 5" hold Lunge on Bosu on Rt  with single Bosu pole x20 Leg press: seat 5: 65# bil 2x10, 35# Rt only 2x10   PATIENT EDUCATION:  Education details: Access Code: [email or web address] Person educated: Patient Education method: Explanation, Demonstration, and Handouts Education comprehension: verbalized understanding and returned demonstration  HOME EXERCISE PROGRAM: Access Code: [email or web address] URL: [email or web address] Date: 09/11/2023 Prepared by: Loistine Simas Beuhring  Exercises - Seated Hamstring Stretch  - 3 x daily - 7 x weekly - 1 sets - 3 reps - 20 hold - Step Up  - 1-2 x daily - 7 x weekly - 1-2 sets - 10 reps - Gastroc Stretch on Wall  - 2 x daily - 7 x weekly - 1 sets - 3 reps - 20 hold - Supine Quad Set on Towel Roll  - 2 x daily - 7 x weekly - 2 sets - 10 reps - 5 hold - Seated Knee Flexion AAROM  - 3 x daily - 7 x weekly - 1 sets - 5 reps - 10 hold - Sit to Stand  - 2 x daily - 7 x weekly - 1-2 sets - 10 reps - Hooklying Clamshell with Resistance  - 1 x daily - 7 x weekly - 1-2 sets - 10 reps  ASSESSMENT:  CLINICAL IMPRESSION: Pt is ready for D/C to HEP.  She is walking regularly and denies any limitations in her daily tasks. Rt knee A/ROM remains limited and she continues to stretch this regularly.    Patient continues to require skilled PT to progress towards goal related activities.  OBJECTIVE IMPAIRMENTS: Abnormal gait, decreased activity tolerance, decreased balance, decreased mobility, difficulty walking, decreased ROM, decreased strength, increased edema, increased fascial restrictions, increased muscle spasms, impaired flexibility, and pain.   ACTIVITY LIMITATIONS: lifting, standing, squatting, stairs, transfers, bathing, dressing, and locomotion level  PARTICIPATION LIMITATIONS: meal prep, cleaning, laundry, driving, shopping, community activity, and yard work  PERSONAL FACTORS: 1-2 comorbidities: Lt hip replacement, Rt knee replacement   are also affecting patient's functional outcome.   REHAB POTENTIAL: Good  CLINICAL DECISION MAKING: Stable/uncomplicated  EVALUATION COMPLEXITY: Moderate   GOALS: Goals reviewed with patient? Yes  SHORT TERM GOALS: Target date: 10/06/2023   Be independent in initial HEP Baseline: Goal status: Met on 09/21/23  2.  Improve Rt knee A/ROM flexion to > or = to 100 degrees to allow for sitting and steps without deviation Baseline: 100 A/ROM, 103 P/ROM (09/28/23) Goal status: MET  3.  Demonstrate symmetry with ambulation on level surface Baseline: symmetry on level surface (11/02/23) Goal status: MET  4.  Perform sit to stand without UE support due to improved knee and hip strength  Baseline:  Goal status:  Met on 09/21/23   LONG TERM GOALS: Target date: 11/03/2023    Be independent in advanced HEP Baseline:  Goal status: MET  2.  Improve LEFS to > or = to 60/80 to allow for return to prior level of function Baseline: 60/80=75% (10/12/23) Goal status: MET  3.  Improve strength and ROM to ascend and descend steps with step-over step pattern and use of 1 rail Baseline: difficulty with descending steps but able to do with step-over-step (11/02/23) Goal status: in progress   4.  Demonstrate Rt knee A/ROM flexion to > or = to 120 degrees to  allow for squatting for gardening Baseline: 110 (11/02/23) Goal status: In progress   5.  Stand and walk in the community without limitation due to pain or fatigue  Baseline: able to walk all distances without significant limitation Goal status: MET    PHYSICAL THERAPY DISCHARGE SUMMARY  Visits from Start of Care: 15  Current functional level related to goals / functional outcomes: See above.  No significant functional deficits at this time.    Remaining deficits: Lt knee A/ROM deficit into flexion consistent with post-op Rt TKA.  She continues to stretch at home.    Education / Equipment: HEP   Patient agrees to discharge. Patient goals were partially met. Patient is being discharged due to being pleased with the current functional level.    Lorrene Reid, PT 11/02/23 1:21 PM  [facility] [address] [place], [place] [address] Phone # [phone] Fax [phone]

## 2023-12-01 DIAGNOSIS — M810 Age-related osteoporosis without current pathological fracture: Secondary | ICD-10-CM | POA: Diagnosis not present

## 2024-01-04 ENCOUNTER — Ambulatory Visit: Admitting: Orthopaedic Surgery

## 2024-01-25 ENCOUNTER — Encounter: Payer: Self-pay | Admitting: Orthopaedic Surgery

## 2024-01-25 ENCOUNTER — Other Ambulatory Visit (INDEPENDENT_AMBULATORY_CARE_PROVIDER_SITE_OTHER): Payer: Self-pay

## 2024-01-25 ENCOUNTER — Ambulatory Visit: Admitting: Orthopaedic Surgery

## 2024-01-25 DIAGNOSIS — Z96651 Presence of right artificial knee joint: Secondary | ICD-10-CM

## 2024-01-25 NOTE — Progress Notes (Signed)
 The patient comes in today at 5 months status post a right total knee arthroplasty.  She reports good range of motion and strength.  Her right knee shows minimal effusion.  Range of motion is almost completely full.  The knee has just a slight bit of play but she does not feel symptoms of instability.  2 views of the right knee show well-seated press-fit total knee arthroplasty with no complicating features.  Her bone quality looks excellent for someone who is 68 years old.  From my standpoint I want her to work on quad strengthening exercises and we will see her back in 6 months with a final AP and lateral right knee.  All questions concerns were addressed and answered

## 2024-03-24 DIAGNOSIS — Z0000 Encounter for general adult medical examination without abnormal findings: Secondary | ICD-10-CM | POA: Diagnosis not present

## 2024-03-24 DIAGNOSIS — M810 Age-related osteoporosis without current pathological fracture: Secondary | ICD-10-CM | POA: Diagnosis not present

## 2024-03-24 DIAGNOSIS — I10 Essential (primary) hypertension: Secondary | ICD-10-CM | POA: Diagnosis not present

## 2024-03-24 DIAGNOSIS — Z23 Encounter for immunization: Secondary | ICD-10-CM | POA: Diagnosis not present

## 2024-03-24 DIAGNOSIS — K219 Gastro-esophageal reflux disease without esophagitis: Secondary | ICD-10-CM | POA: Diagnosis not present

## 2024-03-24 DIAGNOSIS — E7800 Pure hypercholesterolemia, unspecified: Secondary | ICD-10-CM | POA: Diagnosis not present

## 2024-05-30 ENCOUNTER — Encounter: Payer: Self-pay | Admitting: Radiology

## 2024-06-03 DIAGNOSIS — M810 Age-related osteoporosis without current pathological fracture: Secondary | ICD-10-CM | POA: Diagnosis not present

## 2024-07-25 ENCOUNTER — Ambulatory Visit: Admitting: Orthopaedic Surgery

## 2024-07-25 ENCOUNTER — Encounter: Payer: Self-pay | Admitting: Orthopaedic Surgery

## 2024-07-25 ENCOUNTER — Other Ambulatory Visit: Payer: Self-pay

## 2024-07-25 DIAGNOSIS — Z96651 Presence of right artificial knee joint: Secondary | ICD-10-CM | POA: Diagnosis not present

## 2024-07-25 NOTE — Progress Notes (Signed)
 The patient is now 11 months status post a right total knee replacement to treat significant right knee pain and arthritis.  She also has a history of a left hip replacement that we did back in June 2024.  She says that her left hip and her right knee are doing well.  She does have medial joint line tenderness she points to on the left knee.  She is very active and petite 68 years old.  She walks with a normal gait.  Both her knees actually move smoothly and fluidly.  Her right operative knee is ligamentously stable.  The left knee does have pain along the medial joint line.  Both hips move smoothly.  Standing AP and lateral of the right knee also shows a left knee on the AP view.  The right knee has a well-seated press-fit total knee arthroplasty with no complicating features and in good alignment.  The left knee has significant calcifications throughout the knee with near bone-on-bone wear and osteophytes in all 3 compartments.  At this point follow-up for her right knee and left hip can be as needed.  If she does develop any issues with the left knee to consider knee replacement she knows to reach out.  Right now she is doing well and maintaining her level of function without needing any other intervention for the left knee.  If things worsen she knows to reach out and let us  know.
